# Patient Record
Sex: Male | Born: 1940 | Race: White | Hispanic: No | Marital: Married | Smoking: Former smoker
Health system: Southern US, Community
[De-identification: ages and names within clinical notes are randomized; demographics above are authoritative.]

## PROBLEM LIST (undated history)

## (undated) DIAGNOSIS — I10 Essential (primary) hypertension: Secondary | ICD-10-CM

## (undated) DIAGNOSIS — E559 Vitamin D deficiency, unspecified: Secondary | ICD-10-CM

## (undated) DIAGNOSIS — K219 Gastro-esophageal reflux disease without esophagitis: Secondary | ICD-10-CM

## (undated) DIAGNOSIS — S82871A Displaced pilon fracture of right tibia, initial encounter for closed fracture: Secondary | ICD-10-CM

## (undated) DIAGNOSIS — Z8781 Personal history of (healed) traumatic fracture: Secondary | ICD-10-CM

## (undated) DIAGNOSIS — R7303 Prediabetes: Secondary | ICD-10-CM

## (undated) DIAGNOSIS — S82201A Unspecified fracture of shaft of right tibia, initial encounter for closed fracture: Secondary | ICD-10-CM

## (undated) DIAGNOSIS — M1990 Unspecified osteoarthritis, unspecified site: Secondary | ICD-10-CM

## (undated) DIAGNOSIS — S82831A Other fracture of upper and lower end of right fibula, initial encounter for closed fracture: Secondary | ICD-10-CM

## (undated) DIAGNOSIS — H409 Unspecified glaucoma: Secondary | ICD-10-CM

## (undated) DIAGNOSIS — H269 Unspecified cataract: Secondary | ICD-10-CM

## (undated) DIAGNOSIS — H40052 Ocular hypertension, left eye: Secondary | ICD-10-CM

## (undated) DIAGNOSIS — Z8619 Personal history of other infectious and parasitic diseases: Secondary | ICD-10-CM

## (undated) HISTORY — PX: HERNIA REPAIR: SHX51

## (undated) HISTORY — DX: Unspecified cataract: H26.9

## (undated) HISTORY — DX: Unspecified glaucoma: H40.9

## (undated) HISTORY — PX: EYE SURGERY: SHX253

## (undated) HISTORY — DX: Personal history of other infectious and parasitic diseases: Z86.19

## (undated) HISTORY — PX: FRACTURE SURGERY: SHX138

## (undated) HISTORY — DX: Personal history of (healed) traumatic fracture: Z87.81

---

## 1970-05-04 HISTORY — PX: VASECTOMY: SHX75

## 1980-05-04 DIAGNOSIS — Z8781 Personal history of (healed) traumatic fracture: Secondary | ICD-10-CM

## 1980-05-04 HISTORY — PX: ORIF ANKLE FRACTURE: SHX5408

## 1980-05-04 HISTORY — DX: Personal history of (healed) traumatic fracture: Z87.81

## 2007-09-13 DIAGNOSIS — I10 Essential (primary) hypertension: Secondary | ICD-10-CM | POA: Insufficient documentation

## 2008-01-04 ENCOUNTER — Ambulatory Visit: Payer: Self-pay | Admitting: Family Medicine

## 2008-01-12 ENCOUNTER — Ambulatory Visit: Payer: Self-pay | Admitting: Family Medicine

## 2010-09-25 LAB — HM COLONOSCOPY

## 2011-06-24 DIAGNOSIS — K219 Gastro-esophageal reflux disease without esophagitis: Secondary | ICD-10-CM | POA: Diagnosis not present

## 2011-06-24 DIAGNOSIS — R079 Chest pain, unspecified: Secondary | ICD-10-CM | POA: Diagnosis not present

## 2011-06-25 ENCOUNTER — Ambulatory Visit: Payer: Self-pay | Admitting: Family Medicine

## 2011-06-25 DIAGNOSIS — M109 Gout, unspecified: Secondary | ICD-10-CM | POA: Diagnosis not present

## 2011-06-25 DIAGNOSIS — R918 Other nonspecific abnormal finding of lung field: Secondary | ICD-10-CM | POA: Diagnosis not present

## 2011-06-25 DIAGNOSIS — M79609 Pain in unspecified limb: Secondary | ICD-10-CM | POA: Diagnosis not present

## 2011-06-25 DIAGNOSIS — R079 Chest pain, unspecified: Secondary | ICD-10-CM | POA: Diagnosis not present

## 2011-06-25 DIAGNOSIS — I10 Essential (primary) hypertension: Secondary | ICD-10-CM | POA: Diagnosis not present

## 2011-06-25 DIAGNOSIS — R0609 Other forms of dyspnea: Secondary | ICD-10-CM | POA: Diagnosis not present

## 2011-07-02 DIAGNOSIS — I10 Essential (primary) hypertension: Secondary | ICD-10-CM | POA: Diagnosis not present

## 2011-07-02 DIAGNOSIS — R079 Chest pain, unspecified: Secondary | ICD-10-CM | POA: Diagnosis not present

## 2011-07-02 DIAGNOSIS — R918 Other nonspecific abnormal finding of lung field: Secondary | ICD-10-CM | POA: Diagnosis not present

## 2011-07-06 ENCOUNTER — Ambulatory Visit: Payer: Self-pay | Admitting: Family Medicine

## 2011-07-06 DIAGNOSIS — R911 Solitary pulmonary nodule: Secondary | ICD-10-CM | POA: Diagnosis not present

## 2011-07-06 DIAGNOSIS — R918 Other nonspecific abnormal finding of lung field: Secondary | ICD-10-CM | POA: Diagnosis not present

## 2011-07-09 DIAGNOSIS — H40009 Preglaucoma, unspecified, unspecified eye: Secondary | ICD-10-CM | POA: Diagnosis not present

## 2011-07-21 DIAGNOSIS — R12 Heartburn: Secondary | ICD-10-CM | POA: Diagnosis not present

## 2011-07-21 DIAGNOSIS — R9431 Abnormal electrocardiogram [ECG] [EKG]: Secondary | ICD-10-CM | POA: Diagnosis not present

## 2011-07-21 DIAGNOSIS — R079 Chest pain, unspecified: Secondary | ICD-10-CM | POA: Diagnosis not present

## 2011-07-21 DIAGNOSIS — I10 Essential (primary) hypertension: Secondary | ICD-10-CM | POA: Diagnosis not present

## 2011-07-29 DIAGNOSIS — I209 Angina pectoris, unspecified: Secondary | ICD-10-CM | POA: Diagnosis not present

## 2011-07-29 HISTORY — PX: OTHER SURGICAL HISTORY: SHX169

## 2011-08-05 DIAGNOSIS — R079 Chest pain, unspecified: Secondary | ICD-10-CM | POA: Diagnosis not present

## 2011-08-05 DIAGNOSIS — I10 Essential (primary) hypertension: Secondary | ICD-10-CM | POA: Diagnosis not present

## 2011-11-02 DIAGNOSIS — H4010X0 Unspecified open-angle glaucoma, stage unspecified: Secondary | ICD-10-CM | POA: Diagnosis not present

## 2011-11-18 DIAGNOSIS — M2390 Unspecified internal derangement of unspecified knee: Secondary | ICD-10-CM | POA: Diagnosis not present

## 2011-11-25 DIAGNOSIS — M25569 Pain in unspecified knee: Secondary | ICD-10-CM | POA: Diagnosis not present

## 2011-11-26 DIAGNOSIS — M2390 Unspecified internal derangement of unspecified knee: Secondary | ICD-10-CM | POA: Diagnosis not present

## 2011-12-03 HISTORY — PX: KNEE SURGERY: SHX244

## 2011-12-07 DIAGNOSIS — IMO0002 Reserved for concepts with insufficient information to code with codable children: Secondary | ICD-10-CM | POA: Diagnosis not present

## 2011-12-15 DIAGNOSIS — IMO0002 Reserved for concepts with insufficient information to code with codable children: Secondary | ICD-10-CM | POA: Diagnosis not present

## 2011-12-15 DIAGNOSIS — S83289A Other tear of lateral meniscus, current injury, unspecified knee, initial encounter: Secondary | ICD-10-CM | POA: Diagnosis not present

## 2011-12-15 DIAGNOSIS — M2240 Chondromalacia patellae, unspecified knee: Secondary | ICD-10-CM | POA: Diagnosis not present

## 2011-12-15 DIAGNOSIS — M659 Synovitis and tenosynovitis, unspecified: Secondary | ICD-10-CM | POA: Diagnosis not present

## 2011-12-15 DIAGNOSIS — M23302 Other meniscus derangements, unspecified lateral meniscus, unspecified knee: Secondary | ICD-10-CM | POA: Diagnosis not present

## 2011-12-15 DIAGNOSIS — M23329 Other meniscus derangements, posterior horn of medial meniscus, unspecified knee: Secondary | ICD-10-CM | POA: Diagnosis not present

## 2011-12-17 DIAGNOSIS — IMO0002 Reserved for concepts with insufficient information to code with codable children: Secondary | ICD-10-CM | POA: Diagnosis not present

## 2011-12-23 DIAGNOSIS — IMO0002 Reserved for concepts with insufficient information to code with codable children: Secondary | ICD-10-CM | POA: Diagnosis not present

## 2011-12-28 DIAGNOSIS — M2390 Unspecified internal derangement of unspecified knee: Secondary | ICD-10-CM | POA: Diagnosis not present

## 2012-05-09 DIAGNOSIS — H40009 Preglaucoma, unspecified, unspecified eye: Secondary | ICD-10-CM | POA: Diagnosis not present

## 2012-06-09 DIAGNOSIS — H4010X0 Unspecified open-angle glaucoma, stage unspecified: Secondary | ICD-10-CM | POA: Diagnosis not present

## 2012-06-24 DIAGNOSIS — Z1331 Encounter for screening for depression: Secondary | ICD-10-CM | POA: Diagnosis not present

## 2012-06-24 DIAGNOSIS — R918 Other nonspecific abnormal finding of lung field: Secondary | ICD-10-CM | POA: Diagnosis not present

## 2012-06-24 DIAGNOSIS — Z1339 Encounter for screening examination for other mental health and behavioral disorders: Secondary | ICD-10-CM | POA: Diagnosis not present

## 2012-06-24 DIAGNOSIS — I10 Essential (primary) hypertension: Secondary | ICD-10-CM | POA: Diagnosis not present

## 2012-06-24 DIAGNOSIS — Z125 Encounter for screening for malignant neoplasm of prostate: Secondary | ICD-10-CM | POA: Diagnosis not present

## 2012-06-24 DIAGNOSIS — Z0000 Encounter for general adult medical examination without abnormal findings: Secondary | ICD-10-CM | POA: Diagnosis not present

## 2012-12-22 DIAGNOSIS — H4010X0 Unspecified open-angle glaucoma, stage unspecified: Secondary | ICD-10-CM | POA: Diagnosis not present

## 2013-02-06 DIAGNOSIS — Z1339 Encounter for screening examination for other mental health and behavioral disorders: Secondary | ICD-10-CM | POA: Diagnosis not present

## 2013-02-06 DIAGNOSIS — Z1331 Encounter for screening for depression: Secondary | ICD-10-CM | POA: Diagnosis not present

## 2013-02-06 DIAGNOSIS — R109 Unspecified abdominal pain: Secondary | ICD-10-CM | POA: Diagnosis not present

## 2013-02-06 DIAGNOSIS — H6500 Acute serous otitis media, unspecified ear: Secondary | ICD-10-CM | POA: Diagnosis not present

## 2013-02-06 DIAGNOSIS — Z125 Encounter for screening for malignant neoplasm of prostate: Secondary | ICD-10-CM | POA: Diagnosis not present

## 2013-05-25 DIAGNOSIS — H2510 Age-related nuclear cataract, unspecified eye: Secondary | ICD-10-CM | POA: Diagnosis not present

## 2013-05-25 DIAGNOSIS — H40009 Preglaucoma, unspecified, unspecified eye: Secondary | ICD-10-CM | POA: Diagnosis not present

## 2013-11-17 DIAGNOSIS — H4010X0 Unspecified open-angle glaucoma, stage unspecified: Secondary | ICD-10-CM | POA: Diagnosis not present

## 2014-05-08 DIAGNOSIS — R252 Cramp and spasm: Secondary | ICD-10-CM | POA: Diagnosis not present

## 2014-05-08 DIAGNOSIS — Z0000 Encounter for general adult medical examination without abnormal findings: Secondary | ICD-10-CM | POA: Diagnosis not present

## 2014-05-08 DIAGNOSIS — I10 Essential (primary) hypertension: Secondary | ICD-10-CM | POA: Diagnosis not present

## 2014-05-08 DIAGNOSIS — E781 Pure hyperglyceridemia: Secondary | ICD-10-CM | POA: Diagnosis not present

## 2014-05-08 DIAGNOSIS — R42 Dizziness and giddiness: Secondary | ICD-10-CM | POA: Diagnosis not present

## 2014-05-08 DIAGNOSIS — Z125 Encounter for screening for malignant neoplasm of prostate: Secondary | ICD-10-CM | POA: Diagnosis not present

## 2014-05-08 DIAGNOSIS — Z23 Encounter for immunization: Secondary | ICD-10-CM | POA: Diagnosis not present

## 2014-05-08 LAB — LIPID PANEL
Cholesterol: 190 mg/dL (ref 0–200)
HDL: 34 mg/dL — AB (ref 35–70)
LDL Cholesterol: 130 mg/dL
TRIGLYCERIDES: 129 mg/dL (ref 40–160)

## 2014-05-08 LAB — BASIC METABOLIC PANEL
BUN: 22 mg/dL — AB (ref 4–21)
CREATININE: 1.2 mg/dL (ref 0.6–1.3)
Glucose: 113 mg/dL
POTASSIUM: 4.7 mmol/L (ref 3.4–5.3)
Sodium: 142 mmol/L (ref 137–147)

## 2014-05-08 LAB — CBC AND DIFFERENTIAL
HCT: 46 % (ref 41–53)
Hemoglobin: 15.7 g/dL (ref 13.5–17.5)
Platelets: 159 10*3/uL (ref 150–399)
WBC: 5.3 10*3/mL

## 2014-05-08 LAB — HEPATIC FUNCTION PANEL
ALT: 19 U/L (ref 10–40)
AST: 19 U/L (ref 14–40)

## 2014-05-08 LAB — PSA: PSA: 1.7

## 2014-05-17 DIAGNOSIS — H4011X1 Primary open-angle glaucoma, mild stage: Secondary | ICD-10-CM | POA: Diagnosis not present

## 2014-11-08 ENCOUNTER — Other Ambulatory Visit: Payer: Self-pay | Admitting: Family Medicine

## 2014-11-13 DIAGNOSIS — H4011X1 Primary open-angle glaucoma, mild stage: Secondary | ICD-10-CM | POA: Diagnosis not present

## 2014-11-15 DIAGNOSIS — H4011X1 Primary open-angle glaucoma, mild stage: Secondary | ICD-10-CM | POA: Diagnosis not present

## 2015-02-15 DIAGNOSIS — R0602 Shortness of breath: Secondary | ICD-10-CM | POA: Diagnosis not present

## 2015-02-15 DIAGNOSIS — I10 Essential (primary) hypertension: Secondary | ICD-10-CM | POA: Diagnosis not present

## 2015-02-15 DIAGNOSIS — E782 Mixed hyperlipidemia: Secondary | ICD-10-CM | POA: Diagnosis not present

## 2015-03-11 DIAGNOSIS — E782 Mixed hyperlipidemia: Secondary | ICD-10-CM | POA: Diagnosis not present

## 2015-03-11 DIAGNOSIS — I10 Essential (primary) hypertension: Secondary | ICD-10-CM | POA: Diagnosis not present

## 2015-03-11 DIAGNOSIS — R0602 Shortness of breath: Secondary | ICD-10-CM | POA: Diagnosis not present

## 2015-05-05 ENCOUNTER — Other Ambulatory Visit: Payer: Self-pay | Admitting: Family Medicine

## 2015-05-14 DIAGNOSIS — Z86010 Personal history of colonic polyps: Secondary | ICD-10-CM | POA: Insufficient documentation

## 2015-05-14 DIAGNOSIS — K649 Unspecified hemorrhoids: Secondary | ICD-10-CM | POA: Insufficient documentation

## 2015-05-14 DIAGNOSIS — H40059 Ocular hypertension, unspecified eye: Secondary | ICD-10-CM | POA: Insufficient documentation

## 2015-05-14 DIAGNOSIS — R252 Cramp and spasm: Secondary | ICD-10-CM | POA: Insufficient documentation

## 2015-05-15 ENCOUNTER — Ambulatory Visit (INDEPENDENT_AMBULATORY_CARE_PROVIDER_SITE_OTHER): Payer: Medicare Other | Admitting: Family Medicine

## 2015-05-15 ENCOUNTER — Encounter: Payer: Self-pay | Admitting: Family Medicine

## 2015-05-15 VITALS — BP 120/70 | HR 73 | Temp 98.7°F | Resp 16 | Ht 75.19 in | Wt 255.0 lb

## 2015-05-15 DIAGNOSIS — Z0000 Encounter for general adult medical examination without abnormal findings: Secondary | ICD-10-CM

## 2015-05-15 DIAGNOSIS — E781 Pure hyperglyceridemia: Secondary | ICD-10-CM

## 2015-05-15 DIAGNOSIS — I10 Essential (primary) hypertension: Secondary | ICD-10-CM | POA: Diagnosis not present

## 2015-05-15 DIAGNOSIS — Z125 Encounter for screening for malignant neoplasm of prostate: Secondary | ICD-10-CM | POA: Diagnosis not present

## 2015-05-15 NOTE — Progress Notes (Signed)
Patient: Patrick Good, Male    DOB: 1940-11-26, 75 y.o.   MRN: [id number] Visit Date: 05/15/2015  Today's Provider: Lelon Huh, MD   Chief Complaint  Patient presents with  . Medicare Wellness  . Hypertension   Subjective:    Annual wellness visit Patrick Good is a 75 y.o. male. He feels fairly well. He reports exercising some. He reports he is sleeping well.  -----------------------------------------------------------  Follow-up for hyperglyceridemia from 05/08/2014; no changes.     Hypertension, follow-up:  BP Readings from Last 3 Encounters:  05/15/15 120/70  05/08/14 120/70    He was last seen for hypertension 1 years ago.  BP at that visit was 120/70. Management since that visit includes; no changes.He reports good compliance with treatment. He is not having side effects. none  He some exercising. He is adherent to low salt diet.   Outside blood pressures are n/a. He is experiencing none.  Patient denies none.   Cardiovascular risk factors include none.  Use of agents associated with hypertension: none.   ----------------------------------------------------------------------     Review of Systems  Constitutional: Negative.   HENT: Positive for rhinorrhea.   Eyes: Negative.   Respiratory: Negative.   Cardiovascular: Negative.   Gastrointestinal: Negative.   Endocrine: Negative.   Genitourinary: Negative.   Musculoskeletal: Positive for arthralgias.  Skin: Negative.   Allergic/Immunologic: Negative.   Neurological: Positive for numbness.       Left hand is numb in the morning when he wakes, occasionally  Hematological: Negative.   Psychiatric/Behavioral: Negative.     Social History   Social History  . Marital Status: Married    Spouse Name: N/A  . Number of Children: 2  . Years of Education: N/A   Occupational History  . 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 Smoking status: Former Smoker -- 0.50 packs/day for 25 years    Types: Cigarettes    Quit date: 05/04/1992  . Smokeless tobacco: Not on file  . Alcohol Use: No  . Drug Use: No  . Sexual Activity: Not on file   Other Topics Concern  . Not on file   Social History Narrative    Past Medical History  Diagnosis Date  . History of chicken pox   . History of measles   . History of ankle fracture 1982    spiral break; left ankle     Patient Active Problem List   Diagnosis Date Noted  . History of colonic polyps 05/14/2015  . Cramps of lower extremity 05/14/2015  . Elevated IOP 05/14/2015  . Hemorrhoid 05/14/2015  . 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 Essential (primary) hypertension 09/13/2007    Past Surgical History  Procedure Laterality Date  . Knee surgery  12/2011    Left meniscus by Dr. Wylene Simmer in [place]  . Vasectomy  1972  . Hernia repair  1946 and 1952  . Myocardial perfusion scan  07/29/2011    Normal    His family history includes Breast cancer in his sister; Heart Problems in his sister; Multiple sclerosis in his sister.    Previous Medications   ASPIRIN 81 MG TABLET    Take 1 tablet by mouth daily.   LATANOPROST (XALATAN) 0.005 % OPHTHALMIC SOLUTION    Place 1 drop into both eyes daily.   LISINOPRIL-HYDROCHLOROTHIAZIDE (PRINZIDE,ZESTORETIC) 10-12.5 MG TABLET    TAKE 1 TABLET DAILY  MULTIPLE VITAMINS-MINERALS (MULTIVITAMIN ADULT PO)    Take 1 tablet by mouth daily.   NYSTATIN CREAM (MYCOSTATIN)    Apply 1 application topically 2 (two) times daily.    Patient Care Team: [name], MD as PCP - General (Family Medicine)     Objective:   Vitals: BP 120/70 mmHg  Pulse 73  Temp(Src) 98.7 F (37.1 C) (Oral)  Resp 16  Ht 6' 3.19" (1.91 m)  Wt 255 lb (115.667 kg)  BMI 31.71 kg/m2  SpO2 96%  Physical Exam   General Appearance:    Alert, cooperative, no distress, appears stated age, overweight  Head:    Normocephalic, without  obvious abnormality, atraumatic  Eyes:    PERRL, conjunctiva/corneas clear, EOM's intact, fundi    benign, both eyes       Ears:    Normal TM's and external ear canals, both ears  Nose:   Nares normal, septum midline, mucosa normal, no drainage   or sinus tenderness  Throat:   Lips, mucosa, and tongue normal; teeth and gums normal  Neck:   Supple, symmetrical, trachea midline, no adenopathy;       thyroid:  No enlargement/tenderness/nodules; no carotid   bruit or JVD  Back:     Symmetric, no curvature, ROM normal, no CVA tenderness  Lungs:     Clear to auscultation bilaterally, respirations unlabored  Chest wall:    No tenderness or deformity  Heart:    Regular rate and rhythm, S1 and S2 normal, no murmur, rub   or gallop  Abdomen:     Soft, non-tender, bowel sounds active all four quadrants,    no masses, no organomegaly  Genitalia:    deferred  Rectal:    normal tone, normal prostate, no masses or tenderness  Extremities:   Extremities normal, atraumatic, no cyanosis or edema  Pulses:   2+ and symmetric all extremities  Skin:   Skin color, texture, turgor normal, no rashes or lesions  Lymph nodes:   Cervical, supraclavicular, and axillary nodes normal  Neurologic:   CNII-XII intact. Normal strength, sensation and reflexes      throughout    Activities of Daily Living In your present state of health, do you have any difficulty performing the following activities: 05/15/2015  Hearing? N  Vision? N  Difficulty concentrating or making decisions? N  Walking or climbing stairs? N  Dressing or bathing? N  Doing errands, shopping? N    Fall Risk Assessment Fall Risk  05/15/2015  Falls in the past year? No     Depression Screen PHQ 2/9 Scores 05/15/2015  PHQ - 2 Score 0    Cognitive Testing - 6-CIT  Correct? Score   What year is it? yes 0 0 or 4  What month is it? yes 0 0 or 3  Memorize:    Patrick Good,  44,  [address]  [place],      What time is it? (within 1 hour) yes 0  0 or 3  Count backwards from 20 yes 0 0, 2, or 4  Name the months of the year yes 0 0, 2, or 4  Repeat name & address above yes 0 0, 2, 4, 6, 8, or 10       TOTAL SCORE  0/28   Interpretation:  Normal  Normal (0-7) Abnormal (8-28)    Audit-C Alcohol Use Screening  Question Answer Points  How often do you have alcoholic drink? never 0  On days you  do drink alcohol, how many drinks do you typically consume? never 0  How oftey will you drink 6 or more in a total? never 0  Total Score:  0   A score of 3 or more in women, and 4 or more in men indicates increased risk for alcohol abuse, EXCEPT if all of the points are from question 1.     Assessment & Plan:     Annual Wellness Visit  Reviewed patient's Family Medical History Reviewed and updated list of patient's medical providers Assessment of cognitive impairment was done Assessed patient's functional ability Established a written schedule for health screening [place] Completed and Reviewed  Exercise Activities and Dietary recommendations Goals    None      Immunization History  Administered Date(s) Administered  . Influenza, High Dose Seasonal PF 01/23/2015  . Pneumococcal Conjugate-13 05/08/2014  . Pneumococcal Polysaccharide-23 03/22/2009  . Tdap 09/13/2007  . Zoster 06/02/2005    Health Maintenance  Topic Date Due  . INFLUENZA VACCINE  12/03/2014  . TETANUS/TDAP  09/12/2017  . COLONOSCOPY  09/24/2020  . ZOSTAVAX  Completed  . PNA vac Low Risk Adult  Completed      Discussed health benefits of physical activity, and encouraged him to engage in regular exercise appropriate for his age and condition.    -------------------------------------------------------------------------------- 1. Medicare annual wellness visit, subsequent   2. Essential (primary) hypertension Well controlled.  Continue current medications.   He had cardiac evaluation by Dr. Nehemiah Massed recently with normal  stress echocardiogram.  - Renal function panel  3. Pure hyperglyceridemia  - Lipid panel  4. Prostate cancer screening  - PSA

## 2015-05-16 LAB — RENAL FUNCTION PANEL
ALBUMIN: 4.3 g/dL (ref 3.5–4.8)
BUN/Creatinine Ratio: 22 (ref 10–22)
BUN: 25 mg/dL (ref 8–27)
CALCIUM: 9.3 mg/dL (ref 8.6–10.2)
CHLORIDE: 101 mmol/L (ref 96–106)
CO2: 23 mmol/L (ref 18–29)
Creatinine, Ser: 1.15 mg/dL (ref 0.76–1.27)
GFR calc Af Amer: 73 mL/min/{1.73_m2} (ref >59)
GFR calc non Af Amer: 63 mL/min/{1.73_m2} (ref >59)
GLUCOSE: 112 mg/dL — AB (ref 65–99)
PHOSPHORUS: 2.8 mg/dL (ref 2.5–4.5)
POTASSIUM: 4.5 mmol/L (ref 3.5–5.2)
Sodium: 143 mmol/L (ref 134–144)

## 2015-05-16 LAB — LIPID PANEL
CHOLESTEROL TOTAL: 191 mg/dL (ref 100–199)
Chol/HDL Ratio: 6.2 ratio units — ABNORMAL HIGH (ref 0.0–5.0)
HDL: 31 mg/dL — ABNORMAL LOW (ref >39)
LDL CALC: 123 mg/dL — AB (ref 0–99)
Triglycerides: 184 mg/dL — ABNORMAL HIGH (ref 0–149)
VLDL Cholesterol Cal: 37 mg/dL (ref 5–40)

## 2015-05-16 LAB — PSA: Prostate Specific Ag, Serum: 1.7 ng/mL (ref 0.0–4.0)

## 2015-05-23 DIAGNOSIS — H401131 Primary open-angle glaucoma, bilateral, mild stage: Secondary | ICD-10-CM | POA: Diagnosis not present

## 2015-11-21 DIAGNOSIS — H401131 Primary open-angle glaucoma, bilateral, mild stage: Secondary | ICD-10-CM | POA: Diagnosis not present

## 2016-01-03 LAB — LIPID PANEL
Cholesterol: 204 mg/dL — AB (ref 0–200)
HDL: 34 mg/dL — AB (ref 35–70)
LDL Cholesterol: 138 mg/dL
Triglycerides: 155 mg/dL (ref 40–160)

## 2016-01-03 LAB — HEPATIC FUNCTION PANEL
ALT: 19 U/L (ref 10–40)
AST: 19 U/L (ref 14–40)
Alkaline Phosphatase: 52 U/L (ref 25–125)
BILIRUBIN, TOTAL: 0.7 mg/dL

## 2016-01-03 LAB — HEMOGLOBIN A1C: Hemoglobin A1C: 5.9

## 2016-01-03 LAB — BASIC METABOLIC PANEL
CREATININE: 1.4 mg/dL — AB (ref 0.6–1.3)
GLUCOSE: 109 mg/dL

## 2016-01-03 LAB — PSA: PSA: 1.68

## 2016-01-03 LAB — HIV ANTIBODY (ROUTINE TESTING W REFLEX)

## 2016-01-03 LAB — HEPATITIS C ANTIBODY: Anti-HCV: NONREACTIVE

## 2016-03-20 ENCOUNTER — Encounter: Payer: Self-pay | Admitting: Family Medicine

## 2016-04-29 ENCOUNTER — Other Ambulatory Visit: Payer: Self-pay | Admitting: Family Medicine

## 2016-05-19 ENCOUNTER — Encounter: Payer: Self-pay | Admitting: Family Medicine

## 2016-05-19 ENCOUNTER — Ambulatory Visit (INDEPENDENT_AMBULATORY_CARE_PROVIDER_SITE_OTHER): Payer: Medicare Other | Admitting: Family Medicine

## 2016-05-19 VITALS — BP 120/70 | HR 76 | Temp 98.4°F | Resp 16 | Ht 75.0 in | Wt 253.0 lb

## 2016-05-19 DIAGNOSIS — R7303 Prediabetes: Secondary | ICD-10-CM

## 2016-05-19 DIAGNOSIS — I10 Essential (primary) hypertension: Secondary | ICD-10-CM

## 2016-05-19 DIAGNOSIS — Z0000 Encounter for general adult medical examination without abnormal findings: Secondary | ICD-10-CM | POA: Diagnosis not present

## 2016-05-19 DIAGNOSIS — Z125 Encounter for screening for malignant neoplasm of prostate: Secondary | ICD-10-CM

## 2016-05-19 LAB — POCT GLYCOSYLATED HEMOGLOBIN (HGB A1C)
Est. average glucose Bld gHb Est-mCnc: 137
HEMOGLOBIN A1C: 6.4

## 2016-05-19 NOTE — Patient Instructions (Addendum)
   Your average blood sugar today is 137, diabetes is 140   Avoid sweets and starchy foods. Whole fruit is OK, but you should not consume canned fruit or fruit juices.    It is recommended to engage in 150 minutes of moderate exercise, such as brisk walking every week.    Work on losing 20 pounds over the next four months to reduce the risk of becoming diabetic.

## 2016-05-19 NOTE — Progress Notes (Signed)
Patient: Patrick Good, Male    DOB: January 20, 1941, 76 y.o.   MRN: [id number] Visit Date: 05/19/2016  Today's Provider: Lelon Huh, MD   Chief Complaint  Patient presents with  . Medicare Wellness  . Hypertension  . Hyperlipidemia   Subjective:    Annual wellness visit Patrick Good is a 76 y.o. male. He feels well. He reports exercising some/walking. He reports he is sleeping fairly well.  -----------------------------------------------------------    Hypertension, follow-up:  BP Readings from Last 3 Encounters:  05/19/16 120/70  05/15/15 120/70  05/08/14 120/70    He was last seen for hypertension 1 years ago.  BP at that visit was 120/70. Management since that visit includes; no changes.He reports good compliance with treatment. He is not having side effects. none He is exercising. He is adherent to low salt diet.   Outside blood pressures are n/a. He is experiencing none.  Patient denies none.   Cardiovascular risk factors include none.  Use of agents associated with hypertension: none.   ----------------------------------------------------------------    Lipid/Cholesterol, Follow-up:   Last seen for this 1 years ago.  Management since that visit includes; no changes.  Last Lipid Panel:    Component Value Date/Time   CHOL 191 05/15/2015 1007   TRIG 184 (H) 05/15/2015 1007   HDL 31 (L) 05/15/2015 1007   CHOLHDL 6.2 (H) 05/15/2015 1007   LDLCALC 123 (H) 05/15/2015 1007    He reports good compliance with treatment. He is not having side effects. none  Wt Readings from Last 3 Encounters:  05/19/16 253 lb (114.8 kg)  05/15/15 255 lb (115.7 kg)  05/08/14 252 lb (114.3 kg)    ----------------------------------------------------------------  He has insurance physical in august with normal PSA, sable cholesterol at 204 and LDL of 138. However A1c was noted to be 5.9%. He does have an older sister with diabetes. He drinks diluted OJ  every day and and diet is heavy on meats, potatoes and bread. Has not been exercising much lately.    Review of Systems  Constitutional: Negative for chills, diaphoresis and fever.  HENT: Negative for congestion, ear discharge, ear pain, hearing loss, nosebleeds, sore throat and tinnitus.   Eyes: Negative for photophobia, pain, discharge and redness.  Respiratory: Negative for cough, shortness of breath, wheezing and stridor.   Cardiovascular: Negative for chest pain, palpitations and leg swelling.  Gastrointestinal: Negative for abdominal pain, blood in stool, constipation, diarrhea, nausea and vomiting.  Endocrine: Negative for polydipsia.  Genitourinary: Negative for dysuria, flank pain, frequency, hematuria and urgency.  Musculoskeletal: Negative for back pain, myalgias and neck pain.  Skin: Positive for rash.  Allergic/Immunologic: Negative for environmental allergies.  Neurological: Negative for dizziness, tremors, seizures, weakness and headaches.  Hematological: Does not bruise/bleed easily.  Psychiatric/Behavioral: Negative for hallucinations and suicidal ideas. The patient is not nervous/anxious.   All other systems reviewed and are negative.   Social History   Social History  . Marital status: Married    Spouse name: N/A  . Number of children: 2  . Years of education: N/A   Occupational History  . Retired     Former Doctor, hospital working part time in [employer]   Social History Main Topics  . Smoking status: Former Smoker    Packs/day: 0.50    Years: 25.00    Types: Cigarettes    Quit date: 05/04/1992  . Smokeless tobacco: Not on file  . Alcohol use No  . Drug  use: No  . Sexual activity: Not on file   Other Topics Concern  . Not on file   Social History Narrative  . No narrative on file    Past Medical History:  Diagnosis Date  . History of ankle fracture 1982   spiral break; left ankle  . History of chicken pox   . History of measles       Patient Active Problem List   Diagnosis Date Noted  . History of colonic polyps 05/14/2015  . Cramps of lower extremity 05/14/2015  . Elevated IOP 05/14/2015  . Hemorrhoid 05/14/2015  . 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 Essential (primary) hypertension 09/13/2007    Past Surgical History:  Procedure Laterality Date  . HERNIA REPAIR  1946 and 1952  . KNEE SURGERY  12/2011   Left meniscus by Dr. Wylene Simmer in [place]  . Myocardial Perfusion Scan  07/29/2011   Normal  . VASECTOMY  1972    His family history includes Breast cancer in his sister; Heart Problems in his sister; Multiple sclerosis in his sister.      Current Outpatient Prescriptions:  .  aspirin 81 MG tablet, Take 1 tablet by mouth daily., Disp: , Rfl:  .  latanoprost (XALATAN) 0.005 % ophthalmic solution, Place 1 drop into both eyes daily., Disp: , Rfl:  .  lisinopril-hydrochlorothiazide (PRINZIDE,ZESTORETIC) 10-12.5 MG tablet, TAKE 1 TABLET DAILY, Disp: 90 tablet, Rfl: 4 .  Multiple Vitamins-Minerals (MULTIVITAMIN ADULT PO), Take 1 tablet by mouth daily., Disp: , Rfl:   Patient Care Team: [name], MD as PCP - General (Family Medicine)     Objective:   Vitals: BP 120/70 (BP Location: Right Arm, Patient Position: Sitting, Cuff Size: Large)   Pulse 76   Temp 98.4 F (36.9 C) (Oral)   Resp 16   Ht 6\' 3"  (1.905 m)   Wt 253 lb (114.8 kg)   SpO2 96%   BMI 31.62 kg/m   Physical Exam   General Appearance:    Alert, cooperative, no distress, appears stated age, overweight.   Head:    Normocephalic, without obvious abnormality, atraumatic  Eyes:    PERRL, conjunctiva/corneas clear, EOM's intact, fundi    benign, both eyes       Ears:    Normal TM's and external ear canals, both ears  Nose:   Nares normal, septum midline, mucosa normal, no drainage   or sinus tenderness  Throat:   Lips, mucosa, and tongue normal; teeth and gums normal  Neck:   Supple, symmetrical, trachea midline, no  adenopathy;       thyroid:  No enlargement/tenderness/nodules; no carotid   bruit or JVD  Back:     Symmetric, no curvature, ROM normal, no CVA tenderness  Lungs:     Clear to auscultation bilaterally, respirations unlabored  Chest wall:    No tenderness or deformity  Heart:    Regular rate and rhythm, S1 and S2 normal, no murmur, rub   or gallop  Abdomen:     Soft, non-tender, bowel sounds active all four quadrants,    no masses, no organomegaly  Genitalia:    deferred  Rectal:    deferred  Extremities:   Extremities normal, atraumatic, no cyanosis or edema  Pulses:   2+ and symmetric all extremities  Skin:   Skin color, texture, turgor normal, no rashes or lesions  Lymph nodes:   Cervical, supraclavicular, and axillary nodes normal  Neurologic:   CNII-XII intact. Normal strength, sensation and  reflexes      throughout    Activities of Daily Living In your present state of health, do you have any difficulty performing the following activities: 05/19/2016  Hearing? N  Vision? N  Difficulty concentrating or making decisions? N  Walking or climbing stairs? N  Dressing or bathing? N  Doing errands, shopping? N  Some recent data might be hidden    Fall Risk Assessment Fall Risk  05/19/2016 05/15/2015  Falls in the past year? No No     Depression Screen PHQ 2/9 Scores 05/19/2016 05/15/2015  PHQ - 2 Score 0 0    Cognitive Testing - 6-CIT  Correct? Score   What year is it? yes 0 0 or 4  What month is it? yes 0 0 or 3  Memorize:    [name],  44,  High [address]  [place],      What time is it? (within 1 hour) yes 0 0 or 3  Count backwards from 20 yes 0 0, 2, or 4  Name the months of the year yes 0 0, 2, or 4  Repeat name & address above yes 0 0, 2, 4, 6, 8, or 10       TOTAL SCORE  0/28   Interpretation:  Normal  Normal (0-7) Abnormal (8-28)    Audit-C Alcohol Use Screening  Question Answer Points  How often do you have alcoholic drink? never 0  On days you do drink  alcohol, how many drinks do you typically consume? n/a 0  How oftey will you drink 6 or more in a total? never 0  Total Score:  0   A score of 3 or more in women, and 4 or more in men indicates increased risk for alcohol abuse, EXCEPT if all of the points are from question 1.  Results for orders placed or performed in visit on 05/19/16  POCT glycosylated hemoglobin (Hb A1C)  Result Value Ref Range   Hemoglobin A1C 6.4    Est. average glucose Bld gHb Est-mCnc 137       Assessment & Plan:     Annual Wellness Visit  Reviewed patient's Family Medical History Reviewed and updated list of patient's medical providers Assessment of cognitive impairment was done Assessed patient's functional ability Established a written schedule for health screening [place] Completed and Reviewed  Exercise Activities and Dietary recommendations Goals    None      Immunization History  Administered Date(s) Administered  . Influenza, High Dose Seasonal PF 01/23/2015  . Pneumococcal Conjugate-13 05/08/2014  . Pneumococcal Polysaccharide-23 03/22/2009  . Tdap 09/13/2007  . Zoster 06/02/2005    Health Maintenance  Topic Date Due  . INFLUENZA VACCINE  12/03/2015  . TETANUS/TDAP  09/12/2017  . COLONOSCOPY  09/24/2020  . ZOSTAVAX  Completed  . PNA vac Low Risk Adult  Completed     Discussed health benefits of physical activity, and encouraged him to engage in regular exercise appropriate for his age and condition.    -------------------------------------------------------------------------- 1. Medicare annual wellness visit, subsequent   2. Essential (primary) hypertension Well controlled.  Continue current medications.   - EKG 12-Lead  3. Prostate cancer screening   4. Pre-diabetes New diagnosis. Counseled on increased risk for diabetes and strategies to mitigate risk including lifestyle changes as below.   Patient Instructions   Your average blood sugar  today is 137, diabetes is 140   Avoid sweets and starchy foods. Whole fruit is OK, but you  should not consume canned fruit or fruit juices.    It is recommended to engage in 150 minutes of moderate exercise, such as brisk walking every week.    Work on losing 20 pounds over the next four months to reduce the risk of becoming diabetic.     Return in about 4 months (around 09/16/2016).  - POCT glycosylated hemoglobin (Hb A1C)  The entirety of the information documented in the History of Present Illness, Review of Systems and Physical Exam were personally obtained by me. Portions of this information were initially documented by April M. Sabra Heck, CMA and reviewed by me for thoroughness and accuracy.    Lelon Huh, MD  [facility]

## 2016-05-30 ENCOUNTER — Encounter: Payer: Self-pay | Admitting: Family Medicine

## 2016-06-08 DIAGNOSIS — H401131 Primary open-angle glaucoma, bilateral, mild stage: Secondary | ICD-10-CM | POA: Diagnosis not present

## 2016-07-07 ENCOUNTER — Encounter: Payer: Self-pay | Admitting: Family Medicine

## 2016-10-02 ENCOUNTER — Encounter: Payer: Self-pay | Admitting: Family Medicine

## 2016-10-02 ENCOUNTER — Ambulatory Visit (INDEPENDENT_AMBULATORY_CARE_PROVIDER_SITE_OTHER): Payer: Medicare Other | Admitting: Family Medicine

## 2016-10-02 VITALS — BP 120/70 | HR 68 | Temp 98.2°F | Resp 16 | Ht 75.0 in | Wt 253.0 lb

## 2016-10-02 DIAGNOSIS — R7303 Prediabetes: Secondary | ICD-10-CM | POA: Diagnosis not present

## 2016-10-02 DIAGNOSIS — B356 Tinea cruris: Secondary | ICD-10-CM

## 2016-10-02 DIAGNOSIS — I10 Essential (primary) hypertension: Secondary | ICD-10-CM

## 2016-10-02 DIAGNOSIS — L309 Dermatitis, unspecified: Secondary | ICD-10-CM

## 2016-10-02 LAB — POCT GLYCOSYLATED HEMOGLOBIN (HGB A1C)
Est. average glucose Bld gHb Est-mCnc: 120
HEMOGLOBIN A1C: 5.8

## 2016-10-02 MED ORDER — TRIAMCINOLONE ACETONIDE 0.5 % EX OINT: 1.0000 "application " | TOPICAL_OINTMENT | Freq: Two times a day (BID) | CUTANEOUS | 0 refills | Status: DC

## 2016-10-02 MED ORDER — NYSTATIN 100000 UNIT/GM EX CREA: 1.0000 "application " | TOPICAL_CREAM | Freq: Two times a day (BID) | CUTANEOUS | 0 refills | Status: DC

## 2016-10-02 NOTE — Progress Notes (Signed)
Patient: Patrick Good Male    DOB: Feb 19, 1941   76 y.o.   MRN: [id number] Visit Date: 10/02/2016  Today's Provider: Lelon Huh, MD   Chief Complaint  Patient presents with  . Follow-up  . Hypertension   Subjective:    HPI   Hypertension, follow-up:  BP Readings from Last 3 Encounters:  10/02/16 120/70  05/19/16 120/70  05/15/15 120/70    He was last seen for hypertension 5 months ago.  BP at that visit was 120/70. Management since that visit includes; no changes.He reports good compliance with treatment. He is not having side effects. none He is not exercising. He is not adherent to low salt diet.   Outside blood pressures are not chaecking. He is experiencing none.  Patient denies none.   Cardiovascular risk factors include pre-diabetes.  Use of agents associated with hypertension: none.   ----------------------------------------------------------------  Pre-diabetes From 05/19/2016. Aic at that time was 6.4. Estimated average glucose = 137. He has since been on very strict low sugar diet and strictly avoiding simple starches such as breads and pizza crust. Remains physically active. Feels well but not enjoying dietary changes he made.  . Wt Readings from Last 3 Encounters:  10/02/16 253 lb (114.8 kg)  05/19/16 253 lb (114.8 kg)  05/15/15 255 lb (115.7 kg)    He also reports that he occasionally gets rash in groin area. Was prescribed nystatin cream several years ago and he states this always clears rash up right away, but he has used it all up and requests refill. He also gets dry patches on his skin from time to time which is not itchy or painful.     No Known Allergies   Current Outpatient Prescriptions:  .  aspirin 81 MG tablet, Take 1 tablet by mouth daily., Disp: , Rfl:  .  latanoprost (XALATAN) 0.005 % ophthalmic solution, Place 1 drop into both eyes daily., Disp: , Rfl:  .  lisinopril-hydrochlorothiazide (PRINZIDE,ZESTORETIC) 10-12.5 MG  tablet, TAKE 1 TABLET DAILY, Disp: 90 tablet, Rfl: 4 .  Multiple Vitamins-Minerals (MULTIVITAMIN ADULT PO), Take 1 tablet by mouth daily., Disp: , Rfl:  .  Multiple Vitamins-Minerals (VISION VITAMINS PO), Take by mouth., Disp: , Rfl:   Review of Systems  Constitutional: Negative for appetite change, chills and fever.  Respiratory: Negative for chest tightness, shortness of breath and wheezing.   Cardiovascular: Negative for chest pain and palpitations.  Gastrointestinal: Negative for abdominal pain, nausea and vomiting.    Social History  Substance Use Topics  . Smoking status: Former Smoker    Packs/day: 0.50    Years: 25.00    Types: Cigarettes    Quit date: 05/04/1992  . Smokeless tobacco: Never Used  . Alcohol use No   Objective:   BP 120/70 (BP Location: Right Arm, Patient Position: Sitting, Cuff Size: Large)   Pulse 68   Temp 98.2 F (36.8 C) (Oral)   Resp 16   Ht 6\' 3"  (1.905 m)   Wt 253 lb (114.8 kg)   SpO2 94%   BMI 31.62 kg/m  Vitals:   10/02/16 1440  BP: 120/70  Pulse: 68  Resp: 16  Temp: 98.2 F (36.8 C)  TempSrc: Oral  SpO2: 94%  Weight: 253 lb (114.8 kg)  Height: 6\' 3"  (1.905 m)     Physical Exam  General appearance: alert, well developed, well nourished, cooperative and in no distress Head: Normocephalic, without obvious abnormality, atraumatic Respiratory: Respirations even and unlabored,  normal respiratory rate Extremities: No gross deformities Skin: Skin color, texture, turgor normal. No rashes seen  Psych: Appropriate mood and affect. Neurologic: Mental status: Alert, oriented to person, place, and time, thought content appropriate.   Results for orders placed or performed in visit on 10/02/16  POCT glycosylated hemoglobin (Hb A1C)  Result Value Ref Range   Hemoglobin A1C 5.8    Est. average glucose Bld gHb Est-mCnc 120        Assessment & Plan:     1. Pre-diabetes Doing great with diet. He is getting frustrated with following such  a strict diet, and I advised him that he could probably loosen up on a little bit so long as he a1c stays well below 6.5. Is to continue daily physical activity and exercise as tolerated. Will return to check A1c in about 6 months.  - POCT glycosylated hemoglobin (Hb A1C)  2. Tinea cruris Intermittent. Responds well to Nystatin cream which he is out of and is refilled today.   3. Eczema, unspecified type Advised to call for dermatology referral if triamcinolone is not effective.  - triamcinolone ointment (KENALOG) 0.5 %; Apply 1 application topically 2 (two) times daily. To rash on forearm  Dispense: 15 g; Refill: 0  4. Essential (primary) hypertension Well controlled.  Continue current medications.    Return in about 7 months (around 05/04/2017) for Yearly Physical.      The entirety of the information documented in the History of Present Illness, Review of Systems and Physical Exam were personally obtained by me. Portions of this information were initially documented by April M. Sabra Heck, CMA and reviewed by me for thoroughness and accuracy.    Lelon Huh, MD  [facility]

## 2016-10-06 ENCOUNTER — Inpatient Hospital Stay (HOSPITAL_COMMUNITY): Payer: Medicare Other

## 2016-10-06 ENCOUNTER — Emergency Department (HOSPITAL_COMMUNITY): Payer: Medicare Other

## 2016-10-06 ENCOUNTER — Inpatient Hospital Stay (HOSPITAL_COMMUNITY)
Admission: EM | Admit: 2016-10-06 | Discharge: 2016-10-12 | DRG: 493 | Disposition: A | Discharge status: Rehabilitation Facility | Payer: Medicare Other | Attending: Orthopedic Surgery | Admitting: Orthopedic Surgery

## 2016-10-06 ENCOUNTER — Encounter (HOSPITAL_COMMUNITY): Payer: Self-pay

## 2016-10-06 DIAGNOSIS — S82831A Other fracture of upper and lower end of right fibula, initial encounter for closed fracture: Secondary | ICD-10-CM | POA: Diagnosis not present

## 2016-10-06 DIAGNOSIS — M25061 Hemarthrosis, right knee: Secondary | ICD-10-CM

## 2016-10-06 DIAGNOSIS — E860 Dehydration: Secondary | ICD-10-CM | POA: Diagnosis present

## 2016-10-06 DIAGNOSIS — N289 Disorder of kidney and ureter, unspecified: Secondary | ICD-10-CM

## 2016-10-06 DIAGNOSIS — S82401S Unspecified fracture of shaft of right fibula, sequela: Secondary | ICD-10-CM | POA: Diagnosis not present

## 2016-10-06 DIAGNOSIS — N183 Chronic kidney disease, stage 3 (moderate): Secondary | ICD-10-CM | POA: Diagnosis not present

## 2016-10-06 DIAGNOSIS — Z833 Family history of diabetes mellitus: Secondary | ICD-10-CM | POA: Diagnosis not present

## 2016-10-06 DIAGNOSIS — E781 Pure hyperglyceridemia: Secondary | ICD-10-CM | POA: Diagnosis present

## 2016-10-06 DIAGNOSIS — Z7982 Long term (current) use of aspirin: Secondary | ICD-10-CM | POA: Diagnosis not present

## 2016-10-06 DIAGNOSIS — K5903 Drug induced constipation: Secondary | ICD-10-CM

## 2016-10-06 DIAGNOSIS — S82251D Displaced comminuted fracture of shaft of right tibia, subsequent encounter for closed fracture with routine healing: Secondary | ICD-10-CM | POA: Diagnosis not present

## 2016-10-06 DIAGNOSIS — Z87891 Personal history of nicotine dependence: Secondary | ICD-10-CM

## 2016-10-06 DIAGNOSIS — S82871D Displaced pilon fracture of right tibia, subsequent encounter for closed fracture with routine healing: Secondary | ICD-10-CM | POA: Diagnosis not present

## 2016-10-06 DIAGNOSIS — M25461 Effusion, right knee: Secondary | ICD-10-CM | POA: Diagnosis present

## 2016-10-06 DIAGNOSIS — S83241A Other tear of medial meniscus, current injury, right knee, initial encounter: Secondary | ICD-10-CM | POA: Diagnosis present

## 2016-10-06 DIAGNOSIS — I129 Hypertensive chronic kidney disease with stage 1 through stage 4 chronic kidney disease, or unspecified chronic kidney disease: Secondary | ICD-10-CM | POA: Diagnosis present

## 2016-10-06 DIAGNOSIS — S8991XA Unspecified injury of right lower leg, initial encounter: Secondary | ICD-10-CM | POA: Diagnosis not present

## 2016-10-06 DIAGNOSIS — M898X9 Other specified disorders of bone, unspecified site: Secondary | ICD-10-CM | POA: Diagnosis present

## 2016-10-06 DIAGNOSIS — S82409A Unspecified fracture of shaft of unspecified fibula, initial encounter for closed fracture: Secondary | ICD-10-CM

## 2016-10-06 DIAGNOSIS — S82831S Other fracture of upper and lower end of right fibula, sequela: Secondary | ICD-10-CM | POA: Diagnosis not present

## 2016-10-06 DIAGNOSIS — S82251A Displaced comminuted fracture of shaft of right tibia, initial encounter for closed fracture: Principal | ICD-10-CM | POA: Diagnosis present

## 2016-10-06 DIAGNOSIS — Z803 Family history of malignant neoplasm of breast: Secondary | ICD-10-CM | POA: Diagnosis not present

## 2016-10-06 DIAGNOSIS — S82251S Displaced comminuted fracture of shaft of right tibia, sequela: Secondary | ICD-10-CM | POA: Diagnosis not present

## 2016-10-06 DIAGNOSIS — S82201D Unspecified fracture of shaft of right tibia, subsequent encounter for closed fracture with routine healing: Secondary | ICD-10-CM | POA: Diagnosis not present

## 2016-10-06 DIAGNOSIS — R7303 Prediabetes: Secondary | ICD-10-CM | POA: Diagnosis not present

## 2016-10-06 DIAGNOSIS — S82301A Unspecified fracture of lower end of right tibia, initial encounter for closed fracture: Secondary | ICD-10-CM | POA: Diagnosis not present

## 2016-10-06 DIAGNOSIS — W1830XA Fall on same level, unspecified, initial encounter: Secondary | ICD-10-CM | POA: Diagnosis present

## 2016-10-06 DIAGNOSIS — S82831D Other fracture of upper and lower end of right fibula, subsequent encounter for closed fracture with routine healing: Secondary | ICD-10-CM | POA: Diagnosis not present

## 2016-10-06 DIAGNOSIS — S82201S Unspecified fracture of shaft of right tibia, sequela: Secondary | ICD-10-CM | POA: Diagnosis not present

## 2016-10-06 DIAGNOSIS — S82891A Other fracture of right lower leg, initial encounter for closed fracture: Secondary | ICD-10-CM | POA: Diagnosis not present

## 2016-10-06 DIAGNOSIS — S82201A Unspecified fracture of shaft of right tibia, initial encounter for closed fracture: Secondary | ICD-10-CM | POA: Diagnosis not present

## 2016-10-06 DIAGNOSIS — G8918 Other acute postprocedural pain: Secondary | ICD-10-CM | POA: Diagnosis not present

## 2016-10-06 DIAGNOSIS — T465X5A Adverse effect of other antihypertensive drugs, initial encounter: Secondary | ICD-10-CM | POA: Diagnosis present

## 2016-10-06 DIAGNOSIS — Z86010 Personal history of colonic polyps: Secondary | ICD-10-CM

## 2016-10-06 DIAGNOSIS — E559 Vitamin D deficiency, unspecified: Secondary | ICD-10-CM | POA: Diagnosis present

## 2016-10-06 DIAGNOSIS — S83281A Other tear of lateral meniscus, current injury, right knee, initial encounter: Secondary | ICD-10-CM | POA: Diagnosis present

## 2016-10-06 DIAGNOSIS — I951 Orthostatic hypotension: Secondary | ICD-10-CM | POA: Diagnosis not present

## 2016-10-06 DIAGNOSIS — R55 Syncope and collapse: Secondary | ICD-10-CM

## 2016-10-06 DIAGNOSIS — Z419 Encounter for procedure for purposes other than remedying health state, unspecified: Secondary | ICD-10-CM

## 2016-10-06 DIAGNOSIS — S8251XA Displaced fracture of medial malleolus of right tibia, initial encounter for closed fracture: Secondary | ICD-10-CM | POA: Diagnosis present

## 2016-10-06 DIAGNOSIS — I9589 Other hypotension: Secondary | ICD-10-CM | POA: Diagnosis not present

## 2016-10-06 DIAGNOSIS — S82871A Displaced pilon fracture of right tibia, initial encounter for closed fracture: Secondary | ICD-10-CM

## 2016-10-06 DIAGNOSIS — S82302A Unspecified fracture of lower end of left tibia, initial encounter for closed fracture: Secondary | ICD-10-CM

## 2016-10-06 DIAGNOSIS — R269 Unspecified abnormalities of gait and mobility: Secondary | ICD-10-CM | POA: Diagnosis not present

## 2016-10-06 DIAGNOSIS — S82101D Unspecified fracture of upper end of right tibia, subsequent encounter for closed fracture with routine healing: Secondary | ICD-10-CM | POA: Diagnosis not present

## 2016-10-06 DIAGNOSIS — S82871S Displaced pilon fracture of right tibia, sequela: Secondary | ICD-10-CM | POA: Diagnosis not present

## 2016-10-06 DIAGNOSIS — Z820 Family history of epilepsy and other diseases of the nervous system: Secondary | ICD-10-CM

## 2016-10-06 DIAGNOSIS — Z0389 Encounter for observation for other suspected diseases and conditions ruled out: Secondary | ICD-10-CM | POA: Diagnosis not present

## 2016-10-06 DIAGNOSIS — I10 Essential (primary) hypertension: Secondary | ICD-10-CM | POA: Diagnosis present

## 2016-10-06 DIAGNOSIS — W19XXXA Unspecified fall, initial encounter: Secondary | ICD-10-CM

## 2016-10-06 DIAGNOSIS — T148XXA Other injury of unspecified body region, initial encounter: Secondary | ICD-10-CM | POA: Diagnosis not present

## 2016-10-06 DIAGNOSIS — N179 Acute kidney failure, unspecified: Secondary | ICD-10-CM

## 2016-10-06 DIAGNOSIS — R799 Abnormal finding of blood chemistry, unspecified: Secondary | ICD-10-CM | POA: Diagnosis not present

## 2016-10-06 DIAGNOSIS — S82451A Displaced comminuted fracture of shaft of right fibula, initial encounter for closed fracture: Secondary | ICD-10-CM | POA: Diagnosis present

## 2016-10-06 DIAGNOSIS — D696 Thrombocytopenia, unspecified: Secondary | ICD-10-CM | POA: Diagnosis not present

## 2016-10-06 DIAGNOSIS — S82209A Unspecified fracture of shaft of unspecified tibia, initial encounter for closed fracture: Secondary | ICD-10-CM

## 2016-10-06 DIAGNOSIS — D62 Acute posthemorrhagic anemia: Secondary | ICD-10-CM

## 2016-10-06 DIAGNOSIS — I959 Hypotension, unspecified: Secondary | ICD-10-CM

## 2016-10-06 DIAGNOSIS — S82401A Unspecified fracture of shaft of right fibula, initial encounter for closed fracture: Secondary | ICD-10-CM | POA: Diagnosis not present

## 2016-10-06 DIAGNOSIS — S82461A Displaced segmental fracture of shaft of right fibula, initial encounter for closed fracture: Secondary | ICD-10-CM | POA: Diagnosis not present

## 2016-10-06 HISTORY — DX: Displaced pilon fracture of right tibia, initial encounter for closed fracture: S82.871A

## 2016-10-06 HISTORY — DX: Other fracture of upper and lower end of right fibula, initial encounter for closed fracture: S82.831A

## 2016-10-06 HISTORY — DX: Ocular hypertension, left eye: H40.052

## 2016-10-06 HISTORY — DX: Unspecified fracture of shaft of right tibia, initial encounter for closed fracture: S82.201A

## 2016-10-06 HISTORY — DX: Essential (primary) hypertension: I10

## 2016-10-06 HISTORY — DX: Vitamin D deficiency, unspecified: E55.9

## 2016-10-06 HISTORY — DX: Other fracture of upper and lower end of right fibula, initial encounter for closed fracture: S82.871A

## 2016-10-06 LAB — CBC WITH DIFFERENTIAL/PLATELET
BASOS PCT: 0 %
Basophils Absolute: 0 10*3/uL (ref 0.0–0.1)
EOS ABS: 0 10*3/uL (ref 0.0–0.7)
EOS PCT: 0 %
HCT: 39.9 % (ref 39.0–52.0)
Hemoglobin: 13.6 g/dL (ref 13.0–17.0)
Lymphocytes Relative: 12 %
Lymphs Abs: 1.2 10*3/uL (ref 0.7–4.0)
MCH: 30.8 pg (ref 26.0–34.0)
MCHC: 34.1 g/dL (ref 30.0–36.0)
MCV: 90.3 fL (ref 78.0–100.0)
MONO ABS: 0.6 10*3/uL (ref 0.1–1.0)
MONOS PCT: 6 %
Neutro Abs: 8.1 10*3/uL — ABNORMAL HIGH (ref 1.7–7.7)
Neutrophils Relative %: 82 %
PLATELETS: 129 10*3/uL — AB (ref 150–400)
RBC: 4.42 MIL/uL (ref 4.22–5.81)
RDW: 13.3 % (ref 11.5–15.5)
WBC: 9.9 10*3/uL (ref 4.0–10.5)

## 2016-10-06 LAB — BASIC METABOLIC PANEL
Anion gap: 9 (ref 5–15)
BUN: 37 mg/dL — ABNORMAL HIGH (ref 6–20)
CALCIUM: 9.3 mg/dL (ref 8.9–10.3)
CO2: 25 mmol/L (ref 22–32)
CREATININE: 1.65 mg/dL — AB (ref 0.61–1.24)
Chloride: 105 mmol/L (ref 101–111)
GFR calc non Af Amer: 40 mL/min — ABNORMAL LOW (ref >60)
GFR, EST AFRICAN AMERICAN: 46 mL/min — AB (ref >60)
GLUCOSE: 122 mg/dL — AB (ref 65–99)
Potassium: 4.7 mmol/L (ref 3.5–5.1)
Sodium: 139 mmol/L (ref 135–145)

## 2016-10-06 LAB — PROTIME-INR
INR: 1.1
INR: 1.13
PROTHROMBIN TIME: 14.3 s (ref 11.4–15.2)
Prothrombin Time: 14.6 seconds (ref 11.4–15.2)

## 2016-10-06 LAB — TYPE AND SCREEN
ABO/RH(D): A POS
Antibody Screen: NEGATIVE

## 2016-10-06 LAB — APTT: aPTT: 26 seconds (ref 24–36)

## 2016-10-06 LAB — ABO/RH: ABO/RH(D): A POS

## 2016-10-06 MED ORDER — METHOCARBAMOL 1000 MG/10ML IJ SOLN
500.0000 mg | Freq: Once | INTRAVENOUS | Status: AC
  Administered 2016-10-06: 500 mg via INTRAVENOUS
  Filled 2016-10-06: qty 5

## 2016-10-06 MED ORDER — POLYETHYLENE GLYCOL 3350 17 G PO PACK: 17.0000 g | PACK | Freq: Every day | ORAL | Status: DC | PRN

## 2016-10-06 MED ORDER — ADULT MULTIVITAMIN W/MINERALS CH
1.0000 | ORAL_TABLET | Freq: Every day | ORAL | Status: DC
  Administered 2016-10-07 – 2016-10-12 (×5): 1 via ORAL
  Filled 2016-10-06 (×5): qty 1

## 2016-10-06 MED ORDER — LISINOPRIL-HYDROCHLOROTHIAZIDE 10-12.5 MG PO TABS: 1.0000 | ORAL_TABLET | Freq: Every day | ORAL | Status: DC

## 2016-10-06 MED ORDER — METHOCARBAMOL 1000 MG/10ML IJ SOLN
500.0000 mg | Freq: Four times a day (QID) | INTRAVENOUS | Status: DC | PRN
  Filled 2016-10-06: qty 5

## 2016-10-06 MED ORDER — SODIUM CHLORIDE 0.9 % IV BOLUS (SEPSIS)
500.0000 mL | Freq: Once | INTRAVENOUS | Status: AC
  Administered 2016-10-06: 500 mL via INTRAVENOUS

## 2016-10-06 MED ORDER — DIAZEPAM 5 MG/ML IJ SOLN
2.5000 mg | Freq: Once | INTRAMUSCULAR | Status: AC
  Administered 2016-10-06: 2.5 mg via INTRAVENOUS
  Filled 2016-10-06: qty 2

## 2016-10-06 MED ORDER — HYDROMORPHONE HCL 1 MG/ML IJ SOLN
0.5000 mg | INTRAMUSCULAR | Status: DC | PRN
  Administered 2016-10-09 – 2016-10-10 (×2): 1 mg via INTRAVENOUS
  Filled 2016-10-06 (×2): qty 1

## 2016-10-06 MED ORDER — LATANOPROST 0.005 % OP SOLN
1.0000 [drp] | Freq: Every day | OPHTHALMIC | Status: DC
  Administered 2016-10-07 – 2016-10-11 (×6): 1 [drp] via OPHTHALMIC
  Filled 2016-10-06: qty 2.5

## 2016-10-06 MED ORDER — METHOCARBAMOL 500 MG PO TABS
500.0000 mg | ORAL_TABLET | Freq: Four times a day (QID) | ORAL | Status: DC | PRN
  Administered 2016-10-07 – 2016-10-10 (×3): 500 mg via ORAL
  Filled 2016-10-06 (×4): qty 1

## 2016-10-06 MED ORDER — OXYCODONE HCL 5 MG PO TABS
5.0000 mg | ORAL_TABLET | ORAL | Status: DC | PRN
  Administered 2016-10-07 – 2016-10-08 (×3): 10 mg via ORAL
  Filled 2016-10-06 (×3): qty 2

## 2016-10-06 MED ORDER — ENOXAPARIN SODIUM 40 MG/0.4ML ~~LOC~~ SOLN
40.0000 mg | SUBCUTANEOUS | Status: DC
  Administered 2016-10-07 – 2016-10-12 (×5): 40 mg via SUBCUTANEOUS
  Filled 2016-10-06 (×6): qty 0.4

## 2016-10-06 MED ORDER — CYCLOBENZAPRINE HCL 10 MG PO TABS
5.0000 mg | ORAL_TABLET | Freq: Once | ORAL | Status: AC
  Administered 2016-10-06: 5 mg via ORAL
  Filled 2016-10-06: qty 1

## 2016-10-06 MED ORDER — METHOCARBAMOL 1000 MG/10ML IJ SOLN: 500.0000 mg | Freq: Once | INTRAMUSCULAR | Status: DC

## 2016-10-06 MED ORDER — DOCUSATE SODIUM 100 MG PO CAPS
100.0000 mg | ORAL_CAPSULE | Freq: Two times a day (BID) | ORAL | Status: DC
  Administered 2016-10-07 – 2016-10-12 (×9): 100 mg via ORAL
  Filled 2016-10-06 (×11): qty 1

## 2016-10-06 MED ORDER — LISINOPRIL 10 MG PO TABS: 10.0000 mg | ORAL_TABLET | Freq: Every day | ORAL | Status: DC

## 2016-10-06 MED ORDER — HYDROCHLOROTHIAZIDE 12.5 MG PO CAPS
12.5000 mg | ORAL_CAPSULE | Freq: Every day | ORAL | Status: DC
Start: 2016-10-07 — End: 2016-10-06

## 2016-10-06 MED ORDER — ASPIRIN EC 81 MG PO TBEC
81.0000 mg | DELAYED_RELEASE_TABLET | Freq: Every day | ORAL | Status: DC
  Administered 2016-10-07: 81 mg via ORAL
  Filled 2016-10-06: qty 1

## 2016-10-06 MED ORDER — OXYCODONE-ACETAMINOPHEN 5-325 MG PO TABS
1.0000 | ORAL_TABLET | ORAL | Status: DC | PRN
  Administered 2016-10-06: 1 via ORAL
  Administered 2016-10-07 – 2016-10-08 (×7): 2 via ORAL
  Filled 2016-10-06: qty 1
  Filled 2016-10-06 (×7): qty 2

## 2016-10-06 MED ORDER — SODIUM CHLORIDE 0.9 % IV SOLN
INTRAVENOUS | Status: DC
  Administered 2016-10-08 – 2016-10-09 (×3): via INTRAVENOUS

## 2016-10-06 NOTE — ED Triage Notes (Signed)
Pt brought in by EMS after having a fall at home. Pt bent over to pick something up and had a "dizzy spell". Pt has open wound on right lower leg. Per EMS pt has obvious deformity to right lower leg.

## 2016-10-06 NOTE — Progress Notes (Signed)
Orthopedic Tech Progress Note Patient Details:  Patrick Good [birth year] [id number]  Ortho Devices Type of Ortho Device: Ace wrap, Post (short leg) splint, Stirrup splint Ortho Device/Splint Location: RLE Ortho Device/Splint Interventions: Ordered, Application   Braulio Bosch 10/06/2016, 7:57 PM

## 2016-10-06 NOTE — ED Notes (Signed)
Patient transported to X-ray 

## 2016-10-06 NOTE — ED Provider Notes (Signed)
[facility] DEPT Provider Note   CSN: [id number] Arrival date & time: 10/06/16  1714     History   Chief Complaint Chief Complaint  Patient presents with  . Fall  . Leg Injury    HPI Patrick Good is a 76 y.o. male.  HPI Patient has a right lower leg injury. States he has been dizzy when he stood up for the last few months. States he has been losing weight under his doctor's direction is down from 31 pounds. States that today after mowing the lawn he came back home and stood up and fell, and felt a crack in his right lower leg. States the foot is pointing the wrong direction and the skin was stretched by the bone but the bone did not stick through. No loss of consciousness. Last ate at 11:30 this morning. He did however have a few sips of water prior to arriving to the ER.   Past Medical History:  Diagnosis Date  . History of ankle fracture 1982   spiral break; left ankle  . History of chicken pox   . History of measles   . Hypertension     Patient Active Problem List   Diagnosis Date Noted  . Tibia/fibula fracture 10/06/2016  . History of colonic polyps 05/14/2015  . Cramps of lower extremity 05/14/2015  . Elevated IOP 05/14/2015  . Hemorrhoid 05/14/2015  . 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 Essential (primary) hypertension 09/13/2007    Past Surgical History:  Procedure Laterality Date  . HERNIA REPAIR  1946 and 1952  . KNEE SURGERY  12/2011   Left meniscus by Dr. Wylene Simmer in [place]  . Myocardial Perfusion Scan  07/29/2011   Normal  . [place] Medications    Prior to Admission medications   Medication Sig Start Date End Date Taking? Authorizing Provider  aspirin 81 MG tablet Take 1 tablet by mouth daily. 09/13/07  Yes [provider]  ibuprofen (ADVIL,MOTRIN) 200 MG tablet Take 400 mg by mouth as needed for headache.   Yes [provider]  latanoprost (XALATAN) 0.005 % ophthalmic solution Place 1 drop  into both eyes at bedtime.    Yes [provider]  lisinopril-hydrochlorothiazide (PRINZIDE,ZESTORETIC) 10-12.5 MG tablet TAKE 1 TABLET DAILY 04/29/16  Yes [name], MD  Multiple Vitamins-Minerals (MULTIVITAMIN ADULT PO) Take 1 tablet by mouth daily. 09/13/07  Yes [provider]  Multiple Vitamins-Minerals (VISION VITAMINS PO) Take by mouth.   Yes [provider]  nystatin cream (MYCOSTATIN) Apply 1 application topically 2 (two) times daily. Patient taking differently: Apply 1 application topically as needed (rash).  10/02/16  Yes [name], MD  triamcinolone ointment (KENALOG) 0.5 % Apply 1 application topically 2 (two) times daily. To rash on forearm 10/02/16  Yes [name], MD    Family History Family History  Problem Relation Age of Onset  . Heart Problems Sister   . Diabetes type II Sister   . Breast cancer Sister   . Multiple sclerosis Sister     Social History Social History  Substance Use Topics  . Smoking status: Former Smoker    Packs/day: 0.50    Years: 25.00    Types: Cigarettes    Quit date: 05/04/1992  . Smokeless tobacco: Never Used  . Alcohol use No     Allergies   Patient has no known allergies.   Review of Systems Review of Systems  Constitutional: Negative  for appetite change.  HENT: Negative for dental problem.   Respiratory: Negative for shortness of breath.   Cardiovascular: Negative for chest pain.  Gastrointestinal: Negative for abdominal pain.  Genitourinary: Negative for flank pain.  Musculoskeletal: Negative for back pain.  Skin: Positive for wound.  Neurological: Positive for dizziness.  Psychiatric/Behavioral: Negative for confusion.     Physical Exam Updated Vital Signs BP 137/71 (BP Location: Left Arm)   Pulse 83   Temp 99.5 F (37.5 C) (Oral)   Resp 18   Ht 6\' 3"  (1.905 m)   Wt 114.8 kg (253 lb)   SpO2 97%   BMI 31.62 kg/m   Physical Exam  Constitutional: He appears  well-developed.  HENT:  Head: Atraumatic.  Neck: Neck supple.  Cardiovascular: Normal rate.   Pulmonary/Chest: Effort normal.  Abdominal: There is no tenderness.  Musculoskeletal: He exhibits tenderness.  Tenderness to right lower leg. There is an anterior deformity with some external rotation of the foot and ankle. There is an abrasion with some retraction of the skin. No clear laceration. Neurovascular intact in the right foot. Some tenderness for the deformity and laterally. Also mild tenderness proximally over the head of the fibula.  Neurological: He is alert.  Skin: Skin is warm. Capillary refill takes less than 2 seconds.  Psychiatric: He has a normal mood and affect.     ED Treatments / Results  Labs (all labs ordered are listed, but only abnormal results are displayed) Labs Reviewed  BASIC METABOLIC PANEL - Abnormal; Notable for the following:       Result Value   Glucose, Bld 122 (*)    BUN 37 (*)    Creatinine, Ser 1.65 (*)    GFR calc non Af Amer 40 (*)    GFR calc Af Amer 46 (*)    All other components within normal limits  CBC WITH DIFFERENTIAL/PLATELET - Abnormal; Notable for the following:    Platelets 129 (*)    Neutro Abs 8.1 (*)    All other components within normal limits  PROTIME-INR  PROTIME-INR  URINALYSIS, ROUTINE W REFLEX MICROSCOPIC  APTT  BASIC METABOLIC PANEL  TYPE AND SCREEN  ABO/RH    EKG  EKG Interpretation  Date/Time:  Tuesday October 06 2016 17:25:35 EDT Ventricular Rate:  83 PR Interval:    QRS Duration: 113 QT Interval:  370 QTC Calculation: 435 R Axis:   -66 Text Interpretation:  Sinus rhythm Left anterior fascicular block Consider right ventricular hypertrophy Probable anteroseptal infarct, old Confirmed by Alvino Chapel  MD, Myrtle Barnhard [phone]) on 10/06/2016 5:30:25 PM       Radiology Dg Chest 1 View  Result Date: 10/06/2016 CLINICAL DATA:  Leg bathroom EXAM: CHEST 1 VIEW COMPARISON:  None. FINDINGS: No acute consolidation or effusion.  Normal cardiomediastinal silhouette with atherosclerosis. No pneumothorax. IMPRESSION: No active disease. Electronically Signed   By: Donavan Foil M.D.   On: 10/06/2016 18:44   Dg Tibia/fibula Right  Result Date: 10/06/2016 CLINICAL DATA:  Lower leg fracture EXAM: RIGHT TIBIA AND FIBULA - 2 VIEW COMPARISON:  None. FINDINGS: Comminuted fracture involving the distal shaft of the tibia with fracture lucencies extending to the metaphysis of the distal tibia. Approximately 1/2 shaft diameter of lateral displacement of distal fracture fragments and about 1 cm of separation of bony fragments. Additional comminuted fracture involving the proximal metaphysis and shaft of the fibula with a bowel 1/4 shaft diameter of anterior displacement of distal fracture fragment. Degenerative changes at the right knee. IMPRESSION:  1. Comminuted displaced and slightly separated distal tibial fracture 2. Comminuted and mildly displaced proximal fibular fracture Electronically Signed   By: Donavan Foil M.D.   On: 10/06/2016 18:46   Ct Tibia Fibula Right Wo Contrast  Result Date: 10/06/2016 CLINICAL DATA:  Patient fell at home today.  Right leg pain. EXAM: CT OF THE LOWER RIGHT EXTREMITY WITHOUT CONTRAST TECHNIQUE: Multidetector CT imaging of the right lower extremity was performed according to the standard protocol. COMPARISON:  Radiographs from earlier on the same day. FINDINGS: Bones/Joint/Cartilage 1. Acute, closed comminuted fibular neck and head fracture with slight anterior displacement of fibular shaft by 2 mm. 2. Acute, closed comminuted fracture at the junction of the middle and distal third of the tibial shaft with 6 mm of lateral and 4 mm of dorsal displacement of the main fracture fragment. 3. Coronally oriented posterior malleolar fracture without displacement. 4. Anterolateral distal tibial epiphyseal fracture extending into the ankle joint with negligible lateral displacement of the fracture fragment. No dislocation at  the knee or ankle. Subtalar and midfoot articulations to the extent visualized are intact. Ligaments Suboptimally assessed by CT. Muscles and Tendons Negative Soft tissues Soft tissue swelling and subcutaneous edema about the distal leg and ankle. IMPRESSION: 1. Acute, closed comminuted fibular neck and head fracture with slight anterior displacement of fibular shaft by 2 mm. 2. Acute, closed comminuted fracture at the junction of the middle and distal third of the tibial shaft with 6 mm of lateral and 4 mm of dorsal displacement of the main fracture fragment. 3. Coronally oriented posterior malleolar fracture without displacement. 4. Anterolateral distal tibial epiphyseal fracture extending into the ankle joint with negligible lateral displacement of the fracture fragment. 5. No dislocation at the knee or ankle. Subtalar and midfoot articulations to the extent visualized are intact. Electronically Signed   By: Ashley Royalty M.D.   On: 10/06/2016 21:43    Procedures Procedures (including critical care time)  Medications Ordered in ED Medications  oxyCODONE-acetaminophen (PERCOCET/ROXICET) 5-325 MG per tablet 1-2 tablet (1 tablet Oral Given 10/06/16 2101)  aspirin EC tablet 81 mg (not administered)  latanoprost (XALATAN) 0.005 % ophthalmic solution 1 drop (not administered)  multivitamin with minerals tablet 1 tablet (not administered)  enoxaparin (LOVENOX) injection 40 mg (not administered)  0.9 %  sodium chloride infusion (not administered)  oxyCODONE (Oxy IR/ROXICODONE) immediate release tablet 5-10 mg (not administered)  HYDROmorphone (DILAUDID) injection 0.5-1 mg (not administered)  methocarbamol (ROBAXIN) tablet 500 mg (not administered)    Or  methocarbamol (ROBAXIN) 500 mg in dextrose 5 % 50 mL IVPB (not administered)  docusate sodium (COLACE) capsule 100 mg (not administered)  polyethylene glycol (MIRALAX / GLYCOLAX) packet 17 g (not administered)  methocarbamol (ROBAXIN) 500 mg in dextrose  5 % 50 mL IVPB (0 mg Intravenous Stopped 10/06/16 1913)  sodium chloride 0.9 % bolus 500 mL (0 mLs Intravenous Stopped 10/06/16 2010)  diazepam (VALIUM) injection 2.5 mg (2.5 mg Intravenous Given 10/06/16 2021)  cyclobenzaprine (FLEXERIL) tablet 5 mg (5 mg Oral Given 10/06/16 2142)     Initial Impression / Assessment and Plan / ED Course  I have reviewed the triage vital signs and the nursing notes.  Pertinent labs & imaging results that were available during my care of the patient were reviewed by me and considered in my medical decision making (see chart for details).     Patient with renal insufficiency. Mild increased from baseline. Appears to have some orthostatic changes. He did however have a  fall has a distal tibia fracture and proximal fibular fracture. Seen by orthopedic surgery, Dr. Veverly Fells. Will admit with medicine consult.  Final Clinical Impressions(s) / ED Diagnoses   Final diagnoses:  Closed fracture of distal end of left tibia, unspecified fracture morphology, initial encounter  Closed fracture of proximal end of right fibula, unspecified fracture morphology, initial encounter  Renal insufficiency    New Prescriptions Current Discharge Medication List       Davonna Belling, MD 10/06/16 2246

## 2016-10-06 NOTE — H&P (Signed)
EDMON MAGID is an 76 y.o. male.   Chief Complaint: right leg pain HPI: 76 yo male who had a ground level fall due to a brief syncopal episode resulting in a right leg injury.  Patient bent over after mowing his lawn and then upon rising got dizzy and fell hearing his leg "snap".  Patient transported by EMS to [facility] for evaluation of obvious right leg injury.  Past Medical History:  Diagnosis Date  . History of ankle fracture 1982   spiral break; left ankle  . History of chicken pox   . History of measles   . Hypertension     Past Surgical History:  Procedure Laterality Date  . HERNIA REPAIR  1946 and 1952  . KNEE SURGERY  12/2011   Left meniscus by Dr. Wylene Simmer in [place]  . Myocardial Perfusion Scan  07/29/2011   Normal  . VASECTOMY  1972    Family History  Problem Relation Age of Onset  . Heart Problems Sister   . Diabetes type II Sister   . Breast cancer Sister   . Multiple sclerosis Sister    Social History:  reports that he quit smoking about 24 years ago. His smoking use included Cigarettes. He has a 12.50 pack-year smoking history. He has never used smokeless tobacco. He reports that he does not drink alcohol or use drugs.  Allergies: No Known Allergies   (Not in a hospital admission)  Results for orders placed or performed during the hospital encounter of 10/06/16 (from the past 48 hour(s))  Basic metabolic panel     Status: Abnormal   Collection Time: 10/06/16  5:55 PM  Result Value Ref Range   Sodium 139 135 - 145 mmol/L   Potassium 4.7 3.5 - 5.1 mmol/L   Chloride 105 101 - 111 mmol/L   CO2 25 22 - 32 mmol/L   Glucose, Bld 122 (H) 65 - 99 mg/dL   BUN 37 (H) 6 - 20 mg/dL   Creatinine, Ser 1.65 (H) 0.61 - 1.24 mg/dL   Calcium 9.3 8.9 - 10.3 mg/dL   GFR calc non Af Amer 40 (L) >60 mL/min   GFR calc Af Amer 46 (L) >60 mL/min    Comment: (NOTE) The eGFR has been calculated using the CKD EPI equation. This calculation has not been validated in all  clinical situations. eGFR's persistently <60 mL/min signify possible Chronic Kidney Disease.    Anion gap 9 5 - 15  CBC WITH DIFFERENTIAL     Status: Abnormal   Collection Time: 10/06/16  5:55 PM  Result Value Ref Range   WBC 9.9 4.0 - 10.5 K/uL   RBC 4.42 4.22 - 5.81 MIL/uL   Hemoglobin 13.6 13.0 - 17.0 g/dL   HCT 39.9 39.0 - 52.0 %   MCV 90.3 78.0 - 100.0 fL   MCH 30.8 26.0 - 34.0 pg   MCHC 34.1 30.0 - 36.0 g/dL   RDW 13.3 11.5 - 15.5 %   Platelets 129 (L) 150 - 400 K/uL   Neutrophils Relative % 82 %   Neutro Abs 8.1 (H) 1.7 - 7.7 K/uL   Lymphocytes Relative 12 %   Lymphs Abs 1.2 0.7 - 4.0 K/uL   Monocytes Relative 6 %   Monocytes Absolute 0.6 0.1 - 1.0 K/uL   Eosinophils Relative 0 %   Eosinophils Absolute 0.0 0.0 - 0.7 K/uL   Basophils Relative 0 %   Basophils Absolute 0.0 0.0 - 0.1 K/uL  Protime-INR  Status: None   Collection Time: 10/06/16  5:55 PM  Result Value Ref Range   Prothrombin Time 14.3 11.4 - 15.2 seconds   INR 1.10   Type and screen [place]     Status: None   Collection Time: 10/06/16  5:55 PM  Result Value Ref Range   ABO/RH(D) A POS    Antibody Screen NEG    Sample Expiration 10/09/2016   ABO/Rh     Status: None   Collection Time: 10/06/16  5:55 PM  Result Value Ref Range   ABO/RH(D) A POS    Dg Chest 1 View  Result Date: 10/06/2016 CLINICAL DATA:  Leg bathroom EXAM: CHEST 1 VIEW COMPARISON:  None. FINDINGS: No acute consolidation or effusion. Normal cardiomediastinal silhouette with atherosclerosis. No pneumothorax. IMPRESSION: No active disease. Electronically Signed   By: Donavan Foil M.D.   On: 10/06/2016 18:44   Dg Tibia/fibula Right  Result Date: 10/06/2016 CLINICAL DATA:  Lower leg fracture EXAM: RIGHT TIBIA AND FIBULA - 2 VIEW COMPARISON:  None. FINDINGS: Comminuted fracture involving the distal shaft of the tibia with fracture lucencies extending to the metaphysis of the distal tibia. Approximately 1/2 shaft diameter  of lateral displacement of distal fracture fragments and about 1 cm of separation of bony fragments. Additional comminuted fracture involving the proximal metaphysis and shaft of the fibula with a bowel 1/4 shaft diameter of anterior displacement of distal fracture fragment. Degenerative changes at the right knee. IMPRESSION: 1. Comminuted displaced and slightly separated distal tibial fracture 2. Comminuted and mildly displaced proximal fibular fracture Electronically Signed   By: Donavan Foil M.D.   On: 10/06/2016 18:46    ROS denies CP, SOB, other joint complaints  Blood pressure 108/62, pulse 78, resp. rate 16, height '6\' 3"'  (1.905 m), weight 114.8 kg (253 lb), SpO2 100 %.   Physical Exam Healthy appearing male in NAD, neck is non tender with normal ROM,  Bilateral UEs with no deformity and no pain with normal AROM Left LE with normal ROM and no pain, NVI Right LE with moderate swelling, and skin dimple, no drainage or bleeding, ankle externally rotated versus the knee. NVI distally  Assessment/Plan Right distal tibia comminute and displaced fracture with associated proximal fibula fracture.  Unstable pattern with joint extension Will obtain CT scan of the tibia/fibula to evaluate the extent of joint involvement and obliquity of fracture lines for possible percutaneous screw and IM nail fixation. I have discussed this case with Dr Marcelino Scot who will consult on the case - thank you. Admit - elevate, pain control,  and DVT prophylaxis  Richardine Peppers,STEVEN R, MD 10/06/2016, 8:16 PM

## 2016-10-06 NOTE — Consult Note (Signed)
Medical Consultation   Patrick Good  PJA:[id number]  DOB: 1940/11/22  DOA: 10/06/2016  PCP: [name], MD   Requesting physician: Dr. Veverly Fells  Reason for consultation: Medical management    History of Present Illness: Patrick Good is an 76 y.o. male with past medical hx of HTN, pre-diabetes and has been working on intentional weight loss and has lost about 30 pounds with diet, glaucoma, who presents to the ED with chief complaint of syncopal episode resulting in right leg injury. He states that whenever he gets up too fast, he gets episodes of dizziness and feeling like he will pass out. This happens nearly every other day. He states that today, he mowed the lawn, came back inside and was doing house chores when he bent down then got up too fast. He had the sensation of dizziness and feeling faint; he tried to grab onto the chair to steady himself but loss of consciousness for a few seconds. This syncopal episode resulted in right tibia and fibula fracture. Currently in the emergency department, patient is complaining of right leg pain and muscle spasm despite medications received in the emergency department. Orthopedic surgery service to admit and medicine to consult.   Review of Systems:  Review of Systems  Constitutional: Negative for chills and fever.  HENT: Negative.   Eyes: Negative.   Respiratory: Negative for cough and shortness of breath.   Cardiovascular: Negative for chest pain and palpitations.  Gastrointestinal: Negative for abdominal pain, diarrhea, nausea and vomiting.  Genitourinary: Negative for dysuria.  Musculoskeletal:       Right leg pain and muscle spasms  Skin: Negative.   Neurological: Positive for dizziness and loss of consciousness.   As per HPI otherwise 10 point review of systems negative.   Past Medical History: Past Medical History:  Diagnosis Date  . History of ankle fracture 1982   spiral break; left ankle  . History  of chicken pox   . History of measles   . Hypertension     Past Surgical History: Past Surgical History:  Procedure Laterality Date  . HERNIA REPAIR  1946 and 1952  . KNEE SURGERY  12/2011   Left meniscus by Dr. Wylene Simmer in [place]  . Myocardial Perfusion Scan  07/29/2011   Normal  . VASECTOMY  1972    Allergies:  No Known Allergies   Social History:  reports that he quit smoking about 24 years ago. His smoking use included Cigarettes. He has a 12.50 pack-year smoking history. He has never used smokeless tobacco. He reports that he does not drink alcohol or use drugs.   Family History: Family History  Problem Relation Age of Onset  . Heart Problems Sister   . Diabetes type II Sister   . Breast cancer Sister   . Multiple sclerosis Sister     Physical Exam: Vitals:   10/06/16 2000 10/06/16 2015 10/06/16 2030 10/06/16 2045  BP: (!) 156/70 132/71 124/76 139/63  Pulse: 94 84 85 89  Resp: 12 18 20 16   SpO2: 98% 98% 98% 99%  Weight:      Height:        Constitutional:  Alert and awake, oriented x3, not in any acute distress, appears uncomfortable  Eyes: PERLA, EOMI, irises appear normal, anicteric sclera,  ENMT: external ears and nose appear normal, Lips appears normal, oropharynx mucosa, tongue, posterior pharynx appear normal  Neck: neck  appears normal, no masses, normal ROM, no thyromegaly, no JVD  CVS: S1-S2 clear, no murmur rubs or gallops, no LE edema, normal pedal pulses  Respiratory:  clear to auscultation bilaterally, no wheezing, rales or rhonchi. Respiratory effort normal. No accessory muscle use.  Abdomen: soft nontender, nondistended, normal bowel sounds, no hepatosplenomegaly, no hernias  Musculoskeletal: : no cyanosis, clubbing, right lower extremity in cast  Neuro: Cranial nerves II-XII intact, non focal exam  Psych: judgement and insight appear normal, stable mood and affect Skin: no rashes or lesions or ulcers, no induration or nodules   Data  reviewed:  I have personally reviewed following labs and imaging studies Labs:  CBC:  Recent Labs Lab 10/06/16 1755  WBC 9.9  NEUTROABS 8.1*  HGB 13.6  HCT 39.9  MCV 90.3  PLT 129*    Basic Metabolic Panel:  Recent Labs Lab 10/06/16 1755  NA 139  K 4.7  CL 105  CO2 25  GLUCOSE 122*  BUN 37*  CREATININE 1.65*  CALCIUM 9.3   GFR Estimated Creatinine Clearance: 54.5 mL/min (A) (by C-G formula based on SCr of 1.65 mg/dL (H)). Liver Function Tests: No results for input(s): AST, ALT, ALKPHOS, BILITOT, PROT, ALBUMIN in the last 168 hours. No results for input(s): LIPASE, AMYLASE in the last 168 hours. No results for input(s): AMMONIA in the last 168 hours. Coagulation profile  Recent Labs Lab 10/06/16 1755  INR 1.10    Cardiac Enzymes: No results for input(s): CKTOTAL, CKMB, CKMBINDEX, TROPONINI in the last 168 hours. BNP: Invalid input(s): POCBNP CBG: No results for input(s): GLUCAP in the last 168 hours. D-Dimer No results for input(s): DDIMER in the last 72 hours. Hgb A1c No results for input(s): HGBA1C in the last 72 hours. Lipid Profile No results for input(s): CHOL, HDL, LDLCALC, TRIG, CHOLHDL, LDLDIRECT in the last 72 hours. Thyroid function studies No results for input(s): TSH, T4TOTAL, T3FREE, THYROIDAB in the last 72 hours.  Invalid input(s): FREET3 Anemia work up No results for input(s): VITAMINB12, FOLATE, FERRITIN, TIBC, IRON, RETICCTPCT in the last 72 hours. Urinalysis No results found for: COLORURINE, APPEARANCEUR, LABSPEC, [place], GLUCOSEU, HGBUR, BILIRUBINUR, KETONESUR, PROTEINUR, UROBILINOGEN, NITRITE, Pet[place]   Microbiology No results found for this or any previous visit (from the past 240 hour(s)).     Inpatient Medications:   Scheduled Meds: . [START ON 10/07/2016] aspirin EC  81 mg Oral Daily  . docusate sodium  100 mg Oral BID  . [START ON 10/07/2016] enoxaparin (LOVENOX) injection  40 mg Subcutaneous Q24H  . [START ON  10/07/2016] lisinopril  10 mg Oral Daily   And  . [START ON 10/07/2016] hydrochlorothiazide  12.5 mg Oral Daily  . latanoprost  1 drop Both Eyes QHS  . [START ON 10/07/2016] multivitamin with minerals  1 tablet Oral Daily   Continuous Infusions: . sodium chloride    . methocarbamol (ROBAXIN)  IV       Radiological Exams on Admission: Dg Chest 1 View  Result Date: 10/06/2016 CLINICAL DATA:  Leg bathroom EXAM: CHEST 1 VIEW COMPARISON:  None. FINDINGS: No acute consolidation or effusion. Normal cardiomediastinal silhouette with atherosclerosis. No pneumothorax. IMPRESSION: No active disease. Electronically Signed   By: Donavan Foil M.D.   On: 10/06/2016 18:44   Dg Tibia/fibula Right  Result Date: 10/06/2016 CLINICAL DATA:  Lower leg fracture EXAM: RIGHT TIBIA AND FIBULA - 2 VIEW COMPARISON:  None. FINDINGS: Comminuted fracture involving the distal shaft of the tibia with fracture lucencies extending to the metaphysis  of the distal tibia. Approximately 1/2 shaft diameter of lateral displacement of distal fracture fragments and about 1 cm of separation of bony fragments. Additional comminuted fracture involving the proximal metaphysis and shaft of the fibula with a bowel 1/4 shaft diameter of anterior displacement of distal fracture fragment. Degenerative changes at the right knee. IMPRESSION: 1. Comminuted displaced and slightly separated distal tibial fracture 2. Comminuted and mildly displaced proximal fibular fracture Electronically Signed   By: Donavan Foil M.D.   On: 10/06/2016 18:46   EKG reviewed independently, NSR with left axis deviation, left fasc block, no ST change   Impression/Recommendations Active Problems:   Tibia/fibula fracture  Right tib/fib fracture -Per primary service  Syncope -Likely due to orthostatic by history. Would recommend holding antihypertensives. No hx of chest pain, heart palpitations, seizure like activity, and no murmur on exam  -Unable to check orthostatic  VS due to patient's leg fx and bedrest status  -Check echocardiogram  -Tele 24 hours   CKD stage 3 -Baseline Cr 1.4 since Sept 2017   -Repeat BMP in AM   Pre-diabetes -Last Ha1c 5.9   HTN -Well controlled as outpatient, most recent PCP visit with BP 120/70. Hold antihypertensives for now.    Thank you for this consultation.  Our [facility] hospitalist team will follow the patient with you.   Time Spent: 40 minutes   Dessa Phi D.O. [facility] 10/06/2016, 9:23 PM

## 2016-10-06 NOTE — ED Notes (Signed)
Patient transported to CT 

## 2016-10-07 ENCOUNTER — Inpatient Hospital Stay (HOSPITAL_COMMUNITY): Payer: Medicare Other

## 2016-10-07 DIAGNOSIS — S82302A Unspecified fracture of lower end of left tibia, initial encounter for closed fracture: Secondary | ICD-10-CM

## 2016-10-07 LAB — SURGICAL PCR SCREEN
MRSA, PCR: INVALID — AB
Staphylococcus aureus: INVALID — AB

## 2016-10-07 LAB — BASIC METABOLIC PANEL
Anion gap: 9 (ref 5–15)
BUN: 33 mg/dL — AB (ref 6–20)
CO2: 26 mmol/L (ref 22–32)
CREATININE: 1.25 mg/dL — AB (ref 0.61–1.24)
Calcium: 8.8 mg/dL — ABNORMAL LOW (ref 8.9–10.3)
Chloride: 105 mmol/L (ref 101–111)
GFR calc Af Amer: >60 mL/min (ref >60)
GFR calc non Af Amer: 55 mL/min — ABNORMAL LOW (ref >60)
Glucose, Bld: 117 mg/dL — ABNORMAL HIGH (ref 65–99)
POTASSIUM: 3.9 mmol/L (ref 3.5–5.1)
SODIUM: 140 mmol/L (ref 135–145)

## 2016-10-07 LAB — URINALYSIS, ROUTINE W REFLEX MICROSCOPIC
Bilirubin Urine: NEGATIVE
Glucose, UA: NEGATIVE mg/dL
HGB URINE DIPSTICK: NEGATIVE
Ketones, ur: NEGATIVE mg/dL
Leukocytes, UA: NEGATIVE
NITRITE: NEGATIVE
PROTEIN: NEGATIVE mg/dL
Specific Gravity, Urine: 1.023 (ref 1.005–1.030)
pH: 5 (ref 5.0–8.0)

## 2016-10-07 LAB — GLUCOSE, CAPILLARY
Glucose-Capillary: 94 mg/dL (ref 65–99)
Glucose-Capillary: 121 mg/dL — ABNORMAL HIGH (ref 65–99)

## 2016-10-07 MED ORDER — LIDOCAINE HCL 2 % IJ SOLN
5.0000 mL | Freq: Once | INTRAMUSCULAR | Status: AC
  Filled 2016-10-07: qty 10

## 2016-10-07 MED ORDER — BUPIVACAINE HCL (PF) 0.5 % IJ SOLN
10.0000 mL | Freq: Once | INTRAMUSCULAR | Status: AC
  Filled 2016-10-07: qty 10

## 2016-10-07 MED ORDER — METHOCARBAMOL 500 MG PO TABS
500.0000 mg | ORAL_TABLET | Freq: Three times a day (TID) | ORAL | Status: DC
  Administered 2016-10-07 – 2016-10-12 (×17): 500 mg via ORAL
  Filled 2016-10-07 (×17): qty 1

## 2016-10-07 MED ORDER — INSULIN ASPART 100 UNIT/ML ~~LOC~~ SOLN: 0.0000 [IU] | Freq: Three times a day (TID) | SUBCUTANEOUS | Status: DC

## 2016-10-07 MED ORDER — POLYETHYLENE GLYCOL 3350 17 G PO PACK
17.0000 g | PACK | Freq: Every day | ORAL | Status: DC
  Administered 2016-10-07 – 2016-10-08 (×2): 17 g via ORAL
  Filled 2016-10-07 (×2): qty 1

## 2016-10-07 NOTE — Progress Notes (Signed)
Orthopedics Progress Note  Subjective: Complaining of calf spasms last night.  Objective:  Vitals:   10/06/16 2207 10/07/16 0344  BP: 137/71 111/62  Pulse: 83 69  Resp: 18 18  Temp: 99.5 F (37.5 C) 98.9 F (37.2 C)    General: Awake and alert  Musculoskeletal: right leg splinted and elevated. Able to wiggle toes this morning without pain Neurovascularly intact  Lab Results  Component Value Date   WBC 9.9 10/06/2016   HGB 13.6 10/06/2016   HCT 39.9 10/06/2016   MCV 90.3 10/06/2016   PLT 129 (L) 10/06/2016       Component Value Date/Time   NA 140 10/07/2016 0518   NA 143 05/15/2015 1007   K 3.9 10/07/2016 0518   CL 105 10/07/2016 0518   CO2 26 10/07/2016 0518   GLUCOSE 117 (H) 10/07/2016 0518   BUN 33 (H) 10/07/2016 0518   BUN 25 05/15/2015 1007   CREATININE 1.25 (H) 10/07/2016 0518   CALCIUM 8.8 (L) 10/07/2016 0518   GFRNONAA 55 (L) 10/07/2016 0518   GFRAA >60 10/07/2016 0518    Lab Results  Component Value Date   INR 1.13 10/06/2016   INR 1.10 10/06/2016    Assessment/Plan: Day #1 s/p fall with closed tibia/fibula fracture. CT scan shows medial malleolus fracture and extension into the ankle joint but no displacement Pain control and optimization for likely surgery tomorrow. Needs Lovenox - did not get ordered dose last night. I have made nursing aware of this. Mechanical SCDs and overhead trapeze Dr Marcelino Scot to consult and assume care - thank you!   Doran Heater. Veverly Fells, MD 10/07/2016 7:50 AM

## 2016-10-07 NOTE — Consult Note (Signed)
Orthopaedic Trauma Service Consultation  Reason for Consult: Complex right tibia fracture with intraarticular extension Referring Physician: Netta Cedars, MD  Gaylene Brooks Liberman is an 76 y.o. male.  HPI: Ground level fall after syncopal event when rising from squatting position. Had been mowing lawn earlier. Hit face too but no pain now. Denies numbness and tingling. Given the location and complexity of the tibia fracture, Dr. Veverly Fells asserted this was outside his scope of practice and that it would be in the best interest of the patient to have these injuries evaluated and treated by a fellowship trained orthopaedic traumatologist. Consequently, I was consulted to provide further evaluation and management, which we were pleased to do.  Past Medical History:  Diagnosis Date  . History of ankle fracture 1982   spiral break; left ankle  . History of chicken pox   . History of measles   . Hypertension     Past Surgical History:  Procedure Laterality Date  . HERNIA REPAIR  1946 and 1952  . KNEE SURGERY  12/2011   Left meniscus by Dr. Wylene Simmer in [place]  . Myocardial Perfusion Scan  07/29/2011   Normal  . VASECTOMY  1972    Family History  Problem Relation Age of Onset  . Heart Problems Sister   . Diabetes type II Sister   . Breast cancer Sister   . Multiple sclerosis Sister     Social History:  reports that he quit smoking about 24 years ago. His smoking use included Cigarettes. He has a 12.50 pack-year smoking history. He has never used smokeless tobacco. He reports that he does not drink alcohol or use drugs.  Allergies: No Known Allergies  Medications:  Prior to Admission:  Prescriptions Prior to Admission  Medication Sig Dispense Refill Last Dose  . aspirin 81 MG tablet Take 1 tablet by mouth daily.   10/06/2016 at Unknown time  . ibuprofen (ADVIL,MOTRIN) 200 MG tablet Take 400 mg by mouth as needed for headache.   Past Week at Unknown time  . latanoprost (XALATAN)  0.005 % ophthalmic solution Place 1 drop into both eyes at bedtime.    10/05/2016 at Unknown time  . lisinopril-hydrochlorothiazide (PRINZIDE,ZESTORETIC) 10-12.5 MG tablet TAKE 1 TABLET DAILY 90 tablet 4 10/06/2016 at 08:06  . Multiple Vitamins-Minerals (MULTIVITAMIN ADULT PO) Take 1 tablet by mouth daily.   10/06/2016 at Unknown time  . Multiple Vitamins-Minerals (VISION VITAMINS PO) Take by mouth.   10/06/2016 at Unknown time  . nystatin cream (MYCOSTATIN) Apply 1 application topically 2 (two) times daily. (Patient taking differently: Apply 1 application topically as needed (rash). ) 15 g 0 Past Week at Unknown time  . triamcinolone ointment (KENALOG) 0.5 % Apply 1 application topically 2 (two) times daily. To rash on forearm 15 g 0 10/06/2016 at Unknown time    Results for orders placed or performed during the hospital encounter of 10/06/16 (from the past 48 hour(s))  Basic metabolic panel     Status: Abnormal   Collection Time: 10/06/16  5:55 PM  Result Value Ref Range   Sodium 139 135 - 145 mmol/L   Potassium 4.7 3.5 - 5.1 mmol/L   Chloride 105 101 - 111 mmol/L   CO2 25 22 - 32 mmol/L   Glucose, Bld 122 (H) 65 - 99 mg/dL   BUN 37 (H) 6 - 20 mg/dL   Creatinine, Ser 1.65 (H) 0.61 - 1.24 mg/dL   Calcium 9.3 8.9 - 10.3 mg/dL   GFR calc non Af  Amer 40 (L) >60 mL/min   GFR calc Af Amer 46 (L) >60 mL/min    Comment: (NOTE) The eGFR has been calculated using the CKD EPI equation. This calculation has not been validated in all clinical situations. eGFR's persistently <60 mL/min signify possible Chronic Kidney Disease.    Anion gap 9 5 - 15  CBC WITH DIFFERENTIAL     Status: Abnormal   Collection Time: 10/06/16  5:55 PM  Result Value Ref Range   WBC 9.9 4.0 - 10.5 K/uL   RBC 4.42 4.22 - 5.81 MIL/uL   Hemoglobin 13.6 13.0 - 17.0 g/dL   HCT 39.9 39.0 - 52.0 %   MCV 90.3 78.0 - 100.0 fL   MCH 30.8 26.0 - 34.0 pg   MCHC 34.1 30.0 - 36.0 g/dL   RDW 13.3 11.5 - 15.5 %   Platelets 129 (L) 150 - 400  K/uL   Neutrophils Relative % 82 %   Neutro Abs 8.1 (H) 1.7 - 7.7 K/uL   Lymphocytes Relative 12 %   Lymphs Abs 1.2 0.7 - 4.0 K/uL   Monocytes Relative 6 %   Monocytes Absolute 0.6 0.1 - 1.0 K/uL   Eosinophils Relative 0 %   Eosinophils Absolute 0.0 0.0 - 0.7 K/uL   Basophils Relative 0 %   Basophils Absolute 0.0 0.0 - 0.1 K/uL  Protime-INR     Status: None   Collection Time: 10/06/16  5:55 PM  Result Value Ref Range   Prothrombin Time 14.3 11.4 - 15.2 seconds   INR 1.10   Type and screen [place]     Status: None   Collection Time: 10/06/16  5:55 PM  Result Value Ref Range   ABO/RH(D) A POS    Antibody Screen NEG    Sample Expiration 10/09/2016   ABO/Rh     Status: None   Collection Time: 10/06/16  5:55 PM  Result Value Ref Range   ABO/RH(D) A POS   Protime-INR     Status: None   Collection Time: 10/06/16 10:59 PM  Result Value Ref Range   Prothrombin Time 14.6 11.4 - 15.2 seconds   INR 1.13   APTT     Status: None   Collection Time: 10/06/16 10:59 PM  Result Value Ref Range   aPTT 26 24 - 36 seconds  Basic metabolic panel     Status: Abnormal   Collection Time: 10/07/16  5:18 AM  Result Value Ref Range   Sodium 140 135 - 145 mmol/L   Potassium 3.9 3.5 - 5.1 mmol/L   Chloride 105 101 - 111 mmol/L   CO2 26 22 - 32 mmol/L   Glucose, Bld 117 (H) 65 - 99 mg/dL   BUN 33 (H) 6 - 20 mg/dL   Creatinine, Ser 1.25 (H) 0.61 - 1.24 mg/dL   Calcium 8.8 (L) 8.9 - 10.3 mg/dL   GFR calc non Af Amer 55 (L) >60 mL/min   GFR calc Af Amer >60 >60 mL/min    Comment: (NOTE) The eGFR has been calculated using the CKD EPI equation. This calculation has not been validated in all clinical situations. eGFR's persistently <60 mL/min signify possible Chronic Kidney Disease.    Anion gap 9 5 - 15    Dg Chest 1 View  Result Date: 10/06/2016 CLINICAL DATA:  Leg bathroom EXAM: CHEST 1 VIEW COMPARISON:  None. FINDINGS: No acute consolidation or effusion. Normal  cardiomediastinal silhouette with atherosclerosis. No pneumothorax. IMPRESSION: No active disease. Electronically Signed  By: Donavan Foil M.D.   On: 10/06/2016 18:44   Dg Tibia/fibula Right  Result Date: 10/06/2016 CLINICAL DATA:  Lower leg fracture EXAM: RIGHT TIBIA AND FIBULA - 2 VIEW COMPARISON:  None. FINDINGS: Comminuted fracture involving the distal shaft of the tibia with fracture lucencies extending to the metaphysis of the distal tibia. Approximately 1/2 shaft diameter of lateral displacement of distal fracture fragments and about 1 cm of separation of bony fragments. Additional comminuted fracture involving the proximal metaphysis and shaft of the fibula with a bowel 1/4 shaft diameter of anterior displacement of distal fracture fragment. Degenerative changes at the right knee. IMPRESSION: 1. Comminuted displaced and slightly separated distal tibial fracture 2. Comminuted and mildly displaced proximal fibular fracture Electronically Signed   By: Donavan Foil M.D.   On: 10/06/2016 18:46   Ct Tibia Fibula Right Wo Contrast  Result Date: 10/06/2016 CLINICAL DATA:  Patient fell at home today.  Right leg pain. EXAM: CT OF THE LOWER RIGHT EXTREMITY WITHOUT CONTRAST TECHNIQUE: Multidetector CT imaging of the right lower extremity was performed according to the standard protocol. COMPARISON:  Radiographs from earlier on the same day. FINDINGS: Bones/Joint/Cartilage 1. Acute, closed comminuted fibular neck and head fracture with slight anterior displacement of fibular shaft by 2 mm. 2. Acute, closed comminuted fracture at the junction of the middle and distal third of the tibial shaft with 6 mm of lateral and 4 mm of dorsal displacement of the main fracture fragment. 3. Coronally oriented posterior malleolar fracture without displacement. 4. Anterolateral distal tibial epiphyseal fracture extending into the ankle joint with negligible lateral displacement of the fracture fragment. No dislocation at the  knee or ankle. Subtalar and midfoot articulations to the extent visualized are intact. Ligaments Suboptimally assessed by CT. Muscles and Tendons Negative Soft tissues Soft tissue swelling and subcutaneous edema about the distal leg and ankle. IMPRESSION: 1. Acute, closed comminuted fibular neck and head fracture with slight anterior displacement of fibular shaft by 2 mm. 2. Acute, closed comminuted fracture at the junction of the middle and distal third of the tibial shaft with 6 mm of lateral and 4 mm of dorsal displacement of the main fracture fragment. 3. Coronally oriented posterior malleolar fracture without displacement. 4. Anterolateral distal tibial epiphyseal fracture extending into the ankle joint with negligible lateral displacement of the fracture fragment. 5. No dislocation at the knee or ankle. Subtalar and midfoot articulations to the extent visualized are intact. Electronically Signed   By: Ashley Royalty M.D.   On: 10/06/2016 21:43    ROS No recent fever, bleeding abnormalities, urologic dysfunction, GI problems, or weight gain. JHas lost 30 lbs over last few years.   Blood pressure 111/62, pulse 69, temperature 98.9 F (37.2 C), temperature source Oral, resp. rate 18, height _0  (1.905 m), weight 114.8 kg (253 lb), SpO2 99 %. Physical Exam NCAT RRR, no murmurs No wheezing S/NT/ND RLE Dressing intact, clean, dry  Edema/ swelling controlled  HUGE KNEE EFFUSION but no joint line tenderness  Sens: DPN, SPN, TN intact  Motor: EHL, FHL, and lessor toe ext and flex all intact grossly  Brisk cap refill, warm to touch No upper extremity tenderness.  Assessment/Plan: Right tibia shaft fracture, comminuted with extension into the joint, including anterolateral corner of tibia and posterior fleck off incisura suggesting possible syndesmotic injury 1. Aspiration of right knee, if blood then MRI 2. Ice, elevate today and tomorrow to control swelling 3. To OR on Friday for IMN and  percutaneous  screw fixation with syndesmotic evaluation under flouro and possible fixation   Altamese Neponset, MD [facility] [phone] (p)  10/07/2016  9:06 AM

## 2016-10-07 NOTE — Plan of Care (Signed)
Problem: Safety: Goal: Ability to remain free from injury will improve Call bell is in reach. Patient voices understanding about calling for help.

## 2016-10-07 NOTE — Progress Notes (Signed)
Orthopedic Tech Progress Note Patient Details:  Patrick Good 1940/12/20 [id number]  Ortho Devices Type of Ortho Device: Ace wrap, Post (short leg) splint, Stirrup splint Ortho Device/Splint Location: trapeze bar patient helper Ortho Device/Splint Interventions: Application   Patrick Good 10/07/2016, 8:12 AM

## 2016-10-07 NOTE — Procedures (Signed)
Procedure: Right knee aspiration and injection  Indication: Right knee effusion(s)  Surgeon: Silvestre Gunner, PA-C  Assist: None  Anesthesia: 107ml 2% Lidocaine  EBL: None  Complications: None  Findings: After risks/benefits explained patient desires to undergo procedure. Consent obtained and time out performed. Right knee was sterilely prepped and aspirated. 78ml of bloodly fluid obtained. Injected with 53ml mixture of Lidocaine and Marcaine. Pt tolerated the procedure well.    Lisette Abu, PA-C Orthopedic Surgery [phone]

## 2016-10-07 NOTE — Progress Notes (Addendum)
[facility] Consultation Progress Note  Patient: Patrick Good:[id number]   PCP: [name], MD DOB: Sep 13, 1940   DOA: 10/06/2016   DOS: 10/07/2016   Date of Service: the patient was seen and examined on 10/07/2016 Primary service: Netta Cedars, MD  Subjective: Patient mentions that his pain is not well controlled. Located on the right leg. Gets better with rest as well as pain medication. The effect of the medication does not last long enough and therefore he has intermittent pain occurring every 10-15 minutes. No nausea or vomiting. No chest pain or shortness of breath. No further dizziness lying down. Episodes of dizziness has been described as occurring ongoing at home, primarily occurring when he is changing his position. Never at rest. Denies any focal deficit associated with that. No nausea no vomiting no diarrhea at home. Patient has lost 30 pounds over last 6 month after diagnosed with prediabetes.  Brief hospital course: Pt. with PMH of hypertension, prediabetes, intentional weight loss; admitted on 10/06/2016, presented with complaint of dizziness and a fall, was found to have right comminuted tibial shaft fracture. Patient was admitted to orthopedic service, medicine was consulted for further workup of his dizziness and managing medical issues. Pain appears to be the main issue right now. Currently further plan is continue current management, surgery planned on Friday.  Assessment and Plan: 1. Dizziness and lightheadedness. No evidence of vertigo. This likely due to orthostasis. Patient has lost significant amount of weight over last 6 month and continues to take his antihypertensive medication which probably has led to orthostatic hypotension causing him to have the dizziness and fall. Telemetry Personally reviewed overnight unremarkable, EKG personally reviewed no evidence of significant arrhythmia or block. Echocardiogram has been ordered and we'll follow the  results. Patient had a stress test 2 year ago which was unremarkable. No focal deficit on examination to suggest any neurological was for this dizziness.  2. Essential hypertension. Patient mentions his blood pressure with his PCPs office has persistently been 120s and 70s. Taking lisinopril hydrochlorothiazide combination for last 5 years without any change in dose. Has lost a significant amount of weight which I think is the primary reason for patient's orthostasis. Currently I believe that the patient will not require any antihypertensive medication on discharge. Should the patient need antihypertensive medication would prefer to use medication that do not affect renal function. Continue to hold antihypertensive In the hospital and discontinue on discharge.  3. Acute kidney injury on chronic kidney disease stage II. Patient presented with serum creatinine 1.65 and elevated BUN. Baseline creatinine 1.2-1.4. Currently improved with holding his antihypertensive medication. Likely dehydration in the setting of continuous use of lisinopril-hydrochlorothiazide Recommended discontinue on discharge.  4. Prediabetes Hemoglobin A1c 5.9 in September 2017. Patient has lost a significant amount of weight. Daily CBG are well controlled. Check A1c tomorrow.  Diet: Cardiac and carb modified diet DVT Prophylaxis: subcutaneous Heparin  Advance goals of care discussion: Full code  Family Communication: family was present at bedside, at the time of interview. The pt provided permission to discuss medical plan with the family. Opportunity was given to ask question and all questions were answered satisfactorily.   Disposition: We will continue to follow the patient.   Recommendation on discharge: Discontinue antihypertensive medication. Follow-up with PCP in one week. Encourage fluid intake. Recheck BMP in 1 week.  Other Consultants: none Procedures: none Antibiotics: Anti-infectives    None       Objective: Physical Exam: Vitals:   10/06/16  2045 10/06/16 2100 10/06/16 2207 10/07/16 0344  BP: 139/63 129/74 137/71 111/62  Pulse: 89 84 83 69  Resp: 16 15 18 18   Temp:   99.5 F (37.5 C) 98.9 F (37.2 C)  TempSrc:   Oral Oral  SpO2: 99% 99% 97% 99%  Weight:      Height:        Intake/Output Summary (Last 24 hours) at 10/07/16 1022 Last data filed at 10/06/16 2010  Gross per 24 hour  Intake              550 ml  Output                0 ml  Net              550 ml   Filed Weights   10/06/16 1724  Weight: 114.8 kg (253 lb)   General: Alert, Awake and Oriented to Time, Place and Person. Appear in moderate distress, affect appropriate Eyes: PERRL, Conjunctiva normal ENT: Oral Mucosa clear moist. Neck: no JVD, no Abnormal Mass Or lumps Cardiovascular: S1 and S2 Present, no Murmur, Peripheral Pulses Present Respiratory: normal respiratory effort, Bilateral Air entry equal and Decreased, no use of accessory muscle, Clear to Auscultation, no Crackles, no wheezes Abdomen: Bowel Sound present, Soft and no tenderness, no hernia Skin: no redness, no Rash, no induration Extremities: no Pedal edema, no calf tenderness Neurologic: Grossly no focal neuro deficit. Bilaterally Equal motor strength Cerebellar test normal finger nose finger. Gait not checked due to patient safety concerns.     Data Reviewed: CBC:  Recent Labs Lab 10/06/16 1755  WBC 9.9  NEUTROABS 8.1*  HGB 13.6  HCT 39.9  MCV 90.3  PLT 157*   Basic Metabolic Panel:  Recent Labs Lab 10/06/16 1755 10/07/16 0518  NA 139 140  K 4.7 3.9  CL 105 105  CO2 25 26  GLUCOSE 122* 117*  BUN 37* 33*  CREATININE 1.65* 1.25*  CALCIUM 9.3 8.8*   Liver Function Tests: No results for input(s): AST, ALT, ALKPHOS, BILITOT, PROT, ALBUMIN in the last 168 hours. No results for input(s): LIPASE, AMYLASE in the last 168 hours. No results for input(s): AMMONIA in the last 168 hours. Cardiac Enzymes: No results for  input(s): CKTOTAL, CKMB, CKMBINDEX, TROPONINI in the last 168 hours. BNP (last 3 results) No results for input(s): BNP in the last 8760 hours. CBG: No results for input(s): GLUCAP in the last 168 hours. Recent Results (from the past 240 hour(s))  Surgical pcr screen     Status: Abnormal   Collection Time: 10/07/16 12:29 AM  Result Value Ref Range Status   MRSA, PCR INVALID RESULTS, SPECIMEN SENT FOR CULTURE (A) NEGATIVE Final   Staphylococcus aureus INVALID RESULTS, SPECIMEN SENT FOR CULTURE (A) NEGATIVE Final    Comment: Results Called to: Gonzella Lex RN, AT [phone] 10/07/16 BY C. JESSUP     Studies: Dg Chest 1 View  Result Date: 10/06/2016 CLINICAL DATA:  Leg bathroom EXAM: CHEST 1 VIEW COMPARISON:  None. FINDINGS: No acute consolidation or effusion. Normal cardiomediastinal silhouette with atherosclerosis. No pneumothorax. IMPRESSION: No active disease. Electronically Signed   By: Donavan Foil M.D.   On: 10/06/2016 18:44   Dg Tibia/fibula Right  Result Date: 10/06/2016 CLINICAL DATA:  Lower leg fracture EXAM: RIGHT TIBIA AND FIBULA - 2 VIEW COMPARISON:  None. FINDINGS: Comminuted fracture involving the distal shaft of the tibia with fracture lucencies extending to the metaphysis of the distal tibia. Approximately 1/2  shaft diameter of lateral displacement of distal fracture fragments and about 1 cm of separation of bony fragments. Additional comminuted fracture involving the proximal metaphysis and shaft of the fibula with a bowel 1/4 shaft diameter of anterior displacement of distal fracture fragment. Degenerative changes at the right knee. IMPRESSION: 1. Comminuted displaced and slightly separated distal tibial fracture 2. Comminuted and mildly displaced proximal fibular fracture Electronically Signed   By: Donavan Foil M.D.   On: 10/06/2016 18:46   Ct Tibia Fibula Right Wo Contrast  Result Date: 10/06/2016 CLINICAL DATA:  Patient fell at home today.  Right leg pain. EXAM: CT OF THE LOWER  RIGHT EXTREMITY WITHOUT CONTRAST TECHNIQUE: Multidetector CT imaging of the right lower extremity was performed according to the standard protocol. COMPARISON:  Radiographs from earlier on the same day. FINDINGS: Bones/Joint/Cartilage 1. Acute, closed comminuted fibular neck and head fracture with slight anterior displacement of fibular shaft by 2 mm. 2. Acute, closed comminuted fracture at the junction of the middle and distal third of the tibial shaft with 6 mm of lateral and 4 mm of dorsal displacement of the main fracture fragment. 3. Coronally oriented posterior malleolar fracture without displacement. 4. Anterolateral distal tibial epiphyseal fracture extending into the ankle joint with negligible lateral displacement of the fracture fragment. No dislocation at the knee or ankle. Subtalar and midfoot articulations to the extent visualized are intact. Ligaments Suboptimally assessed by CT. Muscles and Tendons Negative Soft tissues Soft tissue swelling and subcutaneous edema about the distal leg and ankle. IMPRESSION: 1. Acute, closed comminuted fibular neck and head fracture with slight anterior displacement of fibular shaft by 2 mm. 2. Acute, closed comminuted fracture at the junction of the middle and distal third of the tibial shaft with 6 mm of lateral and 4 mm of dorsal displacement of the main fracture fragment. 3. Coronally oriented posterior malleolar fracture without displacement. 4. Anterolateral distal tibial epiphyseal fracture extending into the ankle joint with negligible lateral displacement of the fracture fragment. 5. No dislocation at the knee or ankle. Subtalar and midfoot articulations to the extent visualized are intact. Electronically Signed   By: Ashley Royalty M.D.   On: 10/06/2016 21:43     Scheduled Meds: . bupivacaine  10 mL Infiltration Once  . docusate sodium  100 mg Oral BID  . enoxaparin (LOVENOX) injection  40 mg Subcutaneous Q24H  . latanoprost  1 drop Both Eyes QHS  .  lidocaine  5 mL Infiltration Once  . multivitamin with minerals  1 tablet Oral Daily   Continuous Infusions: . sodium chloride 75 mL/hr at 10/06/16 2115  . methocarbamol (ROBAXIN)  IV     PRN Meds: HYDROmorphone (DILAUDID) injection, methocarbamol **OR** methocarbamol (ROBAXIN)  IV, oxyCODONE, oxyCODONE-acetaminophen, polyethylene glycol  Time spent: 35 minutes  Author: Berle Mull, MD [facility] Pager: [phone] 10/07/2016 10:22 AM  If 7PM-7AM, please contact night-coverage at [email or web address], password [facility]

## 2016-10-08 ENCOUNTER — Inpatient Hospital Stay (HOSPITAL_COMMUNITY): Payer: Medicare Other

## 2016-10-08 ENCOUNTER — Encounter (HOSPITAL_COMMUNITY): Payer: Self-pay | Admitting: Orthopedic Surgery

## 2016-10-08 DIAGNOSIS — I10 Essential (primary) hypertension: Secondary | ICD-10-CM

## 2016-10-08 DIAGNOSIS — S82251A Displaced comminuted fracture of shaft of right tibia, initial encounter for closed fracture: Principal | ICD-10-CM

## 2016-10-08 DIAGNOSIS — S82201A Unspecified fracture of shaft of right tibia, initial encounter for closed fracture: Secondary | ICD-10-CM

## 2016-10-08 HISTORY — DX: Unspecified fracture of shaft of right tibia, initial encounter for closed fracture: S82.201A

## 2016-10-08 LAB — RENAL FUNCTION PANEL
ALBUMIN: 3.4 g/dL — AB (ref 3.5–5.0)
Anion gap: 8 (ref 5–15)
BUN: 18 mg/dL (ref 6–20)
CHLORIDE: 102 mmol/L (ref 101–111)
CO2: 27 mmol/L (ref 22–32)
CREATININE: 1.06 mg/dL (ref 0.61–1.24)
Calcium: 8.6 mg/dL — ABNORMAL LOW (ref 8.9–10.3)
GFR calc Af Amer: >60 mL/min (ref >60)
GLUCOSE: 114 mg/dL — AB (ref 65–99)
Phosphorus: 2.6 mg/dL (ref 2.5–4.6)
Potassium: 4.3 mmol/L (ref 3.5–5.1)
Sodium: 137 mmol/L (ref 135–145)

## 2016-10-08 LAB — GLUCOSE, CAPILLARY
GLUCOSE-CAPILLARY: 112 mg/dL — AB (ref 65–99)
GLUCOSE-CAPILLARY: 89 mg/dL (ref 65–99)
GLUCOSE-CAPILLARY: 114 mg/dL — AB (ref 65–99)
Glucose-Capillary: 97 mg/dL (ref 65–99)

## 2016-10-08 LAB — MRSA CULTURE: Culture: NOT DETECTED

## 2016-10-08 LAB — ECHOCARDIOGRAM COMPLETE
AVLVOTPG: 7 mmHg
CHL CUP DOP CALC LVOT VTI: 28.6 cm
CHL CUP MV DEC (S): 278
E/e' ratio: 9.61
EWDT: 278 ms
FS: 34 % (ref 28–44)
HEIGHTINCHES: 75 in
IVS/LV PW RATIO, ED: 1.09
LA diam index: 1.65 cm/m2
LA vol A4C: 67 ml
LASIZE: 41 mm
LAVOL: 57.5 mL
LAVOLIN: 23.1 mL/m2
LDCA: 3.46 cm2
LEFT ATRIUM END SYS DIAM: 41 mm
LV E/e' medial: 9.61
LV PW d: 11 mm — AB (ref 0.6–1.1)
LV e' LATERAL: 10.3 cm/s
LVEEAVG: 9.61
LVOT SV: 99 mL
LVOT peak vel: 135 cm/s
LVOTD: 21 mm
Lateral S' vel: 13.4 cm/s
MV Peak grad: 4 mmHg
MV pk E vel: 99 m/s
MVAP: 2.72 cm2
MVPKAVEL: 108 m/s
P 1/2 time: 81 ms
TAPSE: 30.4 mm
TDI e' lateral: 10.3
TDI e' medial: 9.79
WEIGHTICAEL: 4048 [oz_av]

## 2016-10-08 MED ORDER — CEFAZOLIN SODIUM-DEXTROSE 2-4 GM/100ML-% IV SOLN
2.0000 g | INTRAVENOUS | Status: AC
  Administered 2016-10-09: 2 g via INTRAVENOUS
  Filled 2016-10-08: qty 100

## 2016-10-08 MED ORDER — PERFLUTREN LIPID MICROSPHERE
1.0000 mL | INTRAVENOUS | Status: DC | PRN
  Filled 2016-10-08: qty 10

## 2016-10-08 MED ORDER — PERFLUTREN LIPID MICROSPHERE
1.0000 mL | INTRAVENOUS | Status: AC | PRN
  Administered 2016-10-08: 2.5 mL via INTRAVENOUS
  Filled 2016-10-08: qty 10

## 2016-10-08 NOTE — Plan of Care (Signed)
Problem: Pain Managment: Goal: General experience of comfort will improve Patient's pain is under control per patient. Patient verbalizes understanding of the pain scale.

## 2016-10-08 NOTE — Consult Note (Signed)
Physical Medicine and Rehabilitation Consult   Reason for Consult: right tibial shaft fracture and knee effusion.  Referring Physician: Dr. Marcelino Scot   HPI: Patrick Good is a 76 y.o. male with history of HTN, prediabetes who sustained a fall due to episode of dizziness followed by brief syncopal episode while mowing his lawn. History taken from chart review, patient, and wife. 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 He felt a snap of his ankle and sustained complex right tibial fracture with intraarticular extension. Hospitalist following for management of dehydration due to acute on chronic renal insufficiency and issues with hypotension. Syncope felt to be due to orthostasis. Dr. Marcelino Scot consulted and right knee effusion aspirated with plans for surgery. IM nail in right tibia with ORIF of ankle performed 10/09/16.  Hospital course complicated by pain, constipation.   Review of Systems  Constitutional: Negative for chills and fever.  Eyes: Negative for blurred vision and double vision.  Musculoskeletal: Positive for falls and joint pain.  Neurological: Positive for weakness. Negative for sensory change and speech change.  All other systems reviewed and are negative.     Past Medical History:  Diagnosis Date  . History of ankle fracture 1982   spiral break; left ankle  . History of chicken pox   . History of measles   . Hypertension     Past Surgical History:  Procedure Laterality Date  . HERNIA REPAIR  1946 and 1952  . KNEE SURGERY  12/2011   Left meniscus by Dr. Wylene Simmer in [place]  . Myocardial Perfusion Scan  07/29/2011   Normal  . VASECTOMY  1972    Family History  Problem Relation Age of Onset  . Heart Problems Sister   . Diabetes type II Sister   . Breast cancer Sister   . Multiple sclerosis Sister      Social History:  reports that he quit smoking about 24 years ago. His smoking use included  Cigarettes. He has a 12.50 pack-year smoking history. He has never used smokeless tobacco. He reports that he does not drink alcohol or use drugs.    Allergies: No Known Allergies    Medications Prior to Admission  Medication Sig Dispense Refill  . aspirin 81 MG tablet Take 1 tablet by mouth daily.    Marland Kitchen ibuprofen (ADVIL,MOTRIN) 200 MG tablet Take 400 mg by mouth as needed for headache.    . latanoprost (XALATAN) 0.005 % ophthalmic solution Place 1 drop into both eyes at bedtime.     Marland Kitchen lisinopril-hydrochlorothiazide (PRINZIDE,ZESTORETIC) 10-12.5 MG tablet TAKE 1 TABLET DAILY 90 tablet 4  . Multiple Vitamins-Minerals (MULTIVITAMIN ADULT PO) Take 1 tablet by mouth daily.    . Multiple Vitamins-Minerals (VISION VITAMINS PO) Take by mouth.    . nystatin cream (MYCOSTATIN) Apply 1 application topically 2 (two) times daily. (Patient taking differently: Apply 1 application topically as needed (rash). ) 15 g 0  . triamcinolone ointment (KENALOG) 0.5 % Apply 1 application topically 2 (two) times daily. To rash on forearm 15 g 0    Home: Home Living Family/patient expects to be discharged to:: Private residence Living Arrangements: Spouse/significant other Available Help at Discharge: Family, Available 24 hours/day (wife can't help) Type of Home: House Home Access: Stairs to enter [employer] of Steps: 2 (6") Entrance Stairs-Rails: None Home Layout: Two level, Able to live on main level with bedroom/bathroom Bathroom Shower/Tub: Multimedia programmer:  Handicapped height Home Equipment: Cane - single point, Crutches  Functional History: Prior Function Level of Independence: Independent Functional Status:  Mobility: Bed Mobility Overal bed mobility: Needs Assistance Bed Mobility: Supine to Sit Supine to sit: HOB elevated, Min assist General bed mobility comments: for R LE Transfers Overall transfer level: Needs assistance Equipment used: Rolling walker (2  wheeled) Transfers: Sit to/from Stand, [employer] Transfers Sit to Stand: Min assist Stand pivot transfers: Min assist General transfer comment: cues for hand placement, assist for balance, able to maintain NWB in standing (not in sitting); assist for balance to pivot on L foot to recliner with RW Ambulation/Gait General Gait Details: NT due to pain, anxiety    ADL:    Cognition: Cognition Overall Cognitive Status: Within Functional Limits for tasks assessed Cognition Arousal/Alertness: Awake/alert Behavior During Therapy: WFL for tasks assessed/performed Overall Cognitive Status: Within Functional Limits for tasks assessed  Blood pressure 120/66, pulse 72, temperature 98.8 F (37.1 C), temperature source Oral, resp. rate 18, height 6\' 3"  (1.905 m), weight 114.8 kg (253 lb), SpO2 99 %. Physical Exam  Vitals reviewed. Constitutional: He is oriented to person, place, and time. He appears well-developed and well-nourished.  HENT:  Head: Normocephalic and atraumatic.  Eyes: EOM are normal. Right eye exhibits no discharge. Left eye exhibits no discharge.  Neck: Normal range of motion. Neck supple.  Cardiovascular: Normal rate and regular rhythm.   Respiratory: Effort normal and breath sounds normal.  GI: Soft. Bowel sounds are normal.  Musculoskeletal: He exhibits edema and tenderness.  Neurological: He is alert and oriented to person, place, and time.  Motor: B/l UE 5/5 proximal to distal LLE: 4+/5 proximal to distal RLE: HF 2/5, knee and ankle wrapped, wiggles toes Sensation intact to light touch  Skin: Skin is warm and dry.  RLE dressed  Psychiatric: He has a normal mood and affect. His behavior is normal. Thought content normal.    Results for orders placed or performed during the hospital encounter of 10/06/16 (from the past 24 hour(s))  Glucose, capillary     Status: None   Collection Time: 10/07/16  5:17 PM  Result Value Ref Range   Glucose-Capillary 94 65 - 99 mg/dL    Comment 1 Notify RN    Comment 2 Document in Chart   Glucose, capillary     Status: Abnormal   Collection Time: 10/07/16  9:35 PM  Result Value Ref Range   Glucose-Capillary 121 (H) 65 - 99 mg/dL  Renal function panel     Status: Abnormal   Collection Time: 10/08/16  5:25 AM  Result Value Ref Range   Sodium 137 135 - 145 mmol/L   Potassium 4.3 3.5 - 5.1 mmol/L   Chloride 102 101 - 111 mmol/L   CO2 27 22 - 32 mmol/L   Glucose, Bld 114 (H) 65 - 99 mg/dL   BUN 18 6 - 20 mg/dL   Creatinine, Ser 1.06 0.61 - 1.24 mg/dL   Calcium 8.6 (L) 8.9 - 10.3 mg/dL   Phosphorus 2.6 2.5 - 4.6 mg/dL   Albumin 3.4 (L) 3.5 - 5.0 g/dL   GFR calc non Af Amer >60 >60 mL/min   GFR calc Af Amer >60 >60 mL/min   Anion gap 8 5 - 15  Glucose, capillary     Status: Abnormal   Collection Time: 10/08/16  6:18 AM  Result Value Ref Range   Glucose-Capillary 112 (H) 65 - 99 mg/dL   Dg Chest 1 View  Result  Date: 10/06/2016 CLINICAL DATA:  Leg bathroom EXAM: CHEST 1 VIEW COMPARISON:  None. FINDINGS: No acute consolidation or effusion. Normal cardiomediastinal silhouette with atherosclerosis. No pneumothorax. IMPRESSION: No active disease. Electronically Signed   By: Donavan Foil M.D.   On: 10/06/2016 18:44   Dg Tibia/fibula Right  Result Date: 10/06/2016 CLINICAL DATA:  Lower leg fracture EXAM: RIGHT TIBIA AND FIBULA - 2 VIEW COMPARISON:  None. FINDINGS: Comminuted fracture involving the distal shaft of the tibia with fracture lucencies extending to the metaphysis of the distal tibia. Approximately 1/2 shaft diameter of lateral displacement of distal fracture fragments and about 1 cm of separation of bony fragments. Additional comminuted fracture involving the proximal metaphysis and shaft of the fibula with a bowel 1/4 shaft diameter of anterior displacement of distal fracture fragment. Degenerative changes at the right knee. IMPRESSION: 1. Comminuted displaced and slightly separated distal tibial fracture 2.  Comminuted and mildly displaced proximal fibular fracture Electronically Signed   By: Donavan Foil M.D.   On: 10/06/2016 18:46   Ct Tibia Fibula Right Wo Contrast  Result Date: 10/06/2016 CLINICAL DATA:  Patient fell at home today.  Right leg pain. EXAM: CT OF THE LOWER RIGHT EXTREMITY WITHOUT CONTRAST TECHNIQUE: Multidetector CT imaging of the right lower extremity was performed according to the standard protocol. COMPARISON:  Radiographs from earlier on the same day. FINDINGS: Bones/Joint/Cartilage 1. Acute, closed comminuted fibular neck and head fracture with slight anterior displacement of fibular shaft by 2 mm. 2. Acute, closed comminuted fracture at the junction of the middle and distal third of the tibial shaft with 6 mm of lateral and 4 mm of dorsal displacement of the main fracture fragment. 3. Coronally oriented posterior malleolar fracture without displacement. 4. Anterolateral distal tibial epiphyseal fracture extending into the ankle joint with negligible lateral displacement of the fracture fragment. No dislocation at the knee or ankle. Subtalar and midfoot articulations to the extent visualized are intact. Ligaments Suboptimally assessed by CT. Muscles and Tendons Negative Soft tissues Soft tissue swelling and subcutaneous edema about the distal leg and ankle. IMPRESSION: 1. Acute, closed comminuted fibular neck and head fracture with slight anterior displacement of fibular shaft by 2 mm. 2. Acute, closed comminuted fracture at the junction of the middle and distal third of the tibial shaft with 6 mm of lateral and 4 mm of dorsal displacement of the main fracture fragment. 3. Coronally oriented posterior malleolar fracture without displacement. 4. Anterolateral distal tibial epiphyseal fracture extending into the ankle joint with negligible lateral displacement of the fracture fragment. 5. No dislocation at the knee or ankle. Subtalar and midfoot articulations to the extent visualized are intact.  Electronically Signed   By: Ashley Royalty M.D.   On: 10/06/2016 21:43   Mr Knee Right Wo Contrast  Result Date: 10/08/2016 CLINICAL DATA:  Status post fall. Right leg injury. Right knee hemarthrosis. EXAM: MRI OF THE RIGHT KNEE WITHOUT CONTRAST TECHNIQUE: Multiplanar, multisequence MR imaging of the knee was performed. No intravenous contrast was administered. COMPARISON:  None. FINDINGS: MENISCI Medial meniscus: Small radial tear of the free edge of the body of the medial meniscus. Lateral meniscus: Radial tear of the free edge of the posterior horn of the lateral meniscus. Horizontal tear of the body of the lateral meniscus extending to the free edge. LIGAMENTS Cruciates:  Intact ACL and PCL. Collaterals: Medial collateral ligament is intact. Fibular collateral ligament is attenuated distally concerning for a partial tear. Iliotibial band and biceps femoris tendon are intact. CARTILAGE  Patellofemoral:  No chondral defect. Medial: Partial-thickness cartilage loss of medial femorotibial compartment. Lateral: Partial-thickness cartilage loss of the lateral femorotibial compartment. Joint: Large hemarthrosis. Normal Hoffa's fat. No plical thickening. Popliteal Fossa:  No Baker cyst.  Intact popliteus tendon. Extensor Mechanism: Intact quadriceps tendon and patellar tendon. Intact medial and lateral patellar retinaculum. Intact MPFL. Bones: Comminuted fracture of the fibular head. No other marrow signal abnormality. No other fracture or dislocation. Other: Severe edema in the proximal soleus muscle and tibia [place] anterior muscle likely reflecting muscle strain. IMPRESSION: 1. Large hemarthrosis. 2. Comminuted fracture of the fibular head. 3. Small radial tear of the free edge of the body of the medial meniscus. 4. Radial tear of the free edge of the posterior horn of the lateral meniscus. Horizontal tear of the body of the lateral meniscus extending to the free edge. 5. Fibular collateral ligament is attenuated distally  concerning for a partial tear. Iliotibial band and biceps femoris tendon are intact. Electronically Signed   By: Kathreen Devoid   On: 10/08/2016 08:14    Assessment/Plan: Diagnosis: right tibial shaft fracture and knee effusion Labs independently reviewed.  Records reviewed and summated above.  1. Does the need for close, 24 hr/day medical supervision in concert with the patient's rehab needs make it unreasonable for this patient to be served in a less intensive setting? Yes 2. Co-Morbidities requiring supervision/potential complications:  Post-op pain (Biofeedback training with therapies to help reduce reliance on opiate pain medications, particularly IV dilaudid, monitor pain control during therapies, and sedation at rest and titrate to maximum efficacy to ensure participation and gains in therapies), constipation (adjust bowel reg as necessary), orthostasis (ensure appropriate fluid intake, may consider abd binder), hypotension (see previous), acute on chronic renal failure (avoid nephrotoxic meds), syncope (monitor), HTN (monitor and provide prns in accordance with increased physical exertion and pain), prediabetes  (Monitor in accordance with exercise and adjust meds as necessary), ABLA (transfuse if necessary to ensure appropriate perfusion for increased activity tolerance) 3. Due to bladder management, bowel management, safety, skin/wound care, disease management, pain management and patient education, does the patient require 24 hr/day rehab nursing? Yes 4. Does the patient require coordinated care of a physician, rehab nurse, PT (1-2 hrs/day, 5 days/week) and OT (1-2 hrs/day, 5 days/week) to address physical and functional deficits in the context of the above medical diagnosis(es)? Yes Addressing deficits in the following areas: balance, endurance, locomotion, strength, transferring, bowel/bladder control, bathing, dressing, toileting and psychosocial support 5. Can the patient actively  participate in an intensive therapy program of at least 3 hrs of therapy per day at least 5 days per week? Yes 6. The potential for patient to make measurable gains while on inpatient rehab is excellent 7. Anticipated functional outcomes upon discharge from inpatient rehab are supervision  with PT, supervision with OT, n/a with SLP. 8. Estimated rehab length of stay to reach the above functional goals is: 8-12 days. 9. Anticipated D/C setting: Home 10. Anticipated post D/C treatments: HH therapy and Home excercise program 11. Overall Rehab/Functional Prognosis: excellent  RECOMMENDATIONS: This patient's condition is appropriate for continued rehabilitative care in the following setting: Will need post-op therapy evals.  Likely CIR when patient able to tolerate 3 hours of therapy/day. Patient has agreed to participate in recommended program. Yes Note that insurance prior authorization may be required for reimbursement for recommended care.  Comment: Rehab Admissions Coordinator to follow up.  Delice Lesch, MD, Mellody Drown 10/10/16 Bary Leriche, PA-C 10/08/2016

## 2016-10-08 NOTE — Progress Notes (Signed)
  Echocardiogram 2D Echocardiogram has been performed.  Elantra Caprara G Geovanie Winnett 10/08/2016, 1:51 PM

## 2016-10-08 NOTE — Progress Notes (Signed)
Rehab Admissions Coordinator Note:  Patient was screened by Retta Diones for appropriateness for an Inpatient Acute Rehab Consult.  Noted PT recommending inpatient rehab consult. Noted surgery is pending.  May want to consider inpatient rehab consult post operatively.  Jodell Cipro M 10/08/2016, 11:30 AM  I can be reached at [phone].

## 2016-10-08 NOTE — Evaluation (Signed)
Physical Therapy Evaluation Patient Details Name: Patrick Good MRN: [id number] DOB: 1941-04-20 Today's Date: 10/08/2016   History of Present Illness  Patrick Good is an 76 y.o. male with past medical hx of HTN, pre-diabetes and has been working on intentional weight loss and has lost about 30 pounds with diet, glaucoma, who presents to the ED with chief complaint of syncopal episode resulting in right leg injury.  Found to have R tib/fib fracture, awaiting surgical fixation.  PMH L ankle fx 1980's, meniscal repair, HTN, hernia repair.   Clinical Impression  Patient presents with decreased independence with mobility due to R LE pain, inability to lift on his own, decreased balance, decreased activity tolerance and limited assist available at home.  Currently requires min A for OOB to chair.  Important to maintain foot elevation today to help swelling to allow for surgical fixation.  Feel patient may benefit from CIR rehab prior to d/c home as his wife is unable to provide help to him as he usually helps her at home.  Will follow acutely.    Follow Up Recommendations CIR    Equipment Recommendations  Rolling walker with 5" wheels;3in1 (PT)    Recommendations for Other Services       Precautions / Restrictions Precautions Precautions: Fall Required Braces or Orthoses: Other Brace/Splint Other Brace/Splint: R ankle splint Restrictions Weight Bearing Restrictions: Yes RLE Weight Bearing: Non weight bearing      Mobility  Bed Mobility Overal bed mobility: Needs Assistance Bed Mobility: Supine to Sit     Supine to sit: HOB elevated;Min assist     General bed mobility comments: for R LE  Transfers Overall transfer level: Needs assistance Equipment used: Rolling walker (2 wheeled) Transfers: Sit to/from [employer] Sit to Stand: Min assist Stand pivot transfers: Min assist       General transfer comment: cues for hand placement, assist for balance, able  to maintain NWB in standing (not in sitting); assist for balance to pivot on L foot to recliner with RW  Ambulation/Gait             General Gait Details: NT due to pain, anxiety  Stairs            Wheelchair Mobility    Modified Rankin (Stroke Patients Only)       Balance Overall balance assessment: Needs assistance   Sitting balance-Leahy Scale: Good     Standing balance support: Bilateral upper extremity supported Standing balance-Leahy Scale: Poor Standing balance comment: UE support due to NWB R LE, min A for safety                              Pertinent Vitals/Pain Pain Assessment: 0-10 Pain Score: 3  Pain Location: R shin Pain Descriptors / Indicators: Aching;Dull Pain Intervention(s): Monitored during session;Repositioned    Home Living Family/patient expects to be discharged to:: Private residence Living Arrangements: Spouse/significant other Available Help at Discharge: Family;Available 24 hours/day (wife can't help) Type of Home: House Home Access: Stairs to enter Entrance Stairs-Rails: None Entrance Stairs-Number of Steps: 2 (6") Home Layout: Two level;Able to live on main level with bedroom/bathroom Home Equipment: Kasandra Knudsen - single point;Crutches      Prior Function Level of Independence: Independent               Hand Dominance   Dominant Hand: Right    Extremity/Trunk Assessment   Upper Extremity Assessment Upper Extremity  Assessment: Overall WFL for tasks assessed    Lower Extremity Assessment Lower Extremity Assessment: RLE deficits/detail RLE Deficits / Details: heavy splint and ace wrap to leg, unable to lift unaided, able to wiggle toes, severe pain at times when elevating leg, reports meniscal injury as well in knee, able to perform quad set, ROM NT due to ankle pian       Communication   Communication: No difficulties  Cognition Arousal/Alertness: Awake/alert Behavior During Therapy: WFL for tasks  assessed/performed Overall Cognitive Status: Within Functional Limits for tasks assessed                                        General Comments General comments (skin integrity, edema, etc.): Educated to perform toe wiggles each hour, and did quad sets 3 x 5 sec hold on R    Exercises     Assessment/Plan    PT Assessment Patient needs continued PT services  PT Problem List Decreased mobility;Decreased knowledge of precautions;Decreased knowledge of use of DME;Pain;Decreased balance;Decreased activity tolerance;Decreased range of motion       PT Treatment Interventions DME instruction;Gait training;Therapeutic exercise;Stair training;Functional mobility training;Balance training;Therapeutic activities;Patient/family education    PT Goals (Current goals can be found in the Care Plan section)  Acute Rehab PT Goals Patient Stated Goal: To return to independent PT Goal Formulation: With patient/family Time For Goal Achievement: 10/15/16 Potential to Achieve Goals: Good    Frequency Min 5X/week   Barriers to discharge        Co-evaluation               AM-PAC PT "6 Clicks" Daily Activity  Outcome Measure Difficulty turning over in bed (including adjusting bedclothes, sheets and blankets)?: A Little Difficulty moving from lying on back to sitting on the side of the bed? : Total Difficulty sitting down on and standing up from a chair with arms (e.g., wheelchair, bedside commode, etc,.)?: Total Help needed moving to and from a bed to chair (including a wheelchair)?: A Little Help needed walking in hospital room?: A Little Help needed climbing 3-5 steps with a railing? : A Lot 6 Click Score: 13    End of Session Equipment Utilized During Treatment: Gait belt   Patient left: with call bell/phone within reach;in chair;with family/visitor present Nurse Communication: Mobility status;Patient requests pain meds PT Visit Diagnosis: Difficulty in walking, not  elsewhere classified (R26.2);Pain Pain - Right/Left: Right Pain - part of body: Ankle and joints of foot    Time: 7628-3151 PT Time Calculation (min) (ACUTE ONLY): 37 min   Charges:   PT Evaluation $PT Eval Moderate Complexity: 1 Procedure PT Treatments $Therapeutic Activity: 8-22 mins   PT G CodesMagda Kiel, [place] [phone] 10/08/2016   Reginia Naas 10/08/2016, 11:00 AM

## 2016-10-08 NOTE — Progress Notes (Signed)
OT Cancellation Note  Patient Details Name: Patrick Good MRN: [id number] DOB: 06/01/1940   Cancelled Treatment:    Reason Eval/Treat Not Completed: Patient at procedure or test/ unavailable. Will reattempt OT eval as schedule permits.   Tyrone Schimke OTR/L Pager: [phone]   10/08/2016, 11:45 AM

## 2016-10-08 NOTE — Progress Notes (Signed)
[facility] Consultation Progress Note  Patient: Patrick Good QQI:[id number]   PCP: [name], MD DOB: 1940/08/05   DOA: 10/06/2016   DOS: 10/08/2016   Date of Service: the patient was seen and examined on 10/08/2016 Primary service: [name], MD  Subjective: No acute complaint, pain is well-controlled  Brief hospital course: Pt. with PMH of hypertension, prediabetes, intentional weight loss; admitted on 10/06/2016, presented with complaint of dizziness and a fall, was found to have right comminuted tibial shaft fracture. Patient was admitted to orthopedic service, medicine was consulted for further workup of his dizziness and managing medical issues. Pain appears to be the main issue right now. Episodes of dizziness has been described as occurring ongoing at home, primarily occurring when he is changing his position. Never at rest. Denies any focal deficit associated with that. No nausea no vomiting no diarrhea at home. Patient has lost 30 pounds over last 6 month after diagnosed with prediabetes. Currently further plan is continue current management, surgery planned on Friday.  Assessment and Plan: 1. Dizziness and lightheadedness. No evidence of vertigo. This likely due to orthostasis. Patient has lost significant amount of weight over last 6 month and continues to take his antihypertensive medication which probably has led to orthostatic hypotension causing him to have the dizziness and fall. Telemetry Personally reviewed overnight unremarkable, EKG personally reviewed no evidence of significant arrhythmia or block. Echocardiogram shows no acute abnormality. Ejection fraction normal as well. Diastolic dysfunction. No wall motion abnormality. No further workup at present. Patient had a stress test 2 year ago which was unremarkable. No focal deficit on examination to suggest any neurological was for this dizziness.  2. Essential hypertension. Patient mentions his blood  pressure with his PCPs office has persistently been 120s and 70s. Taking lisinopril hydrochlorothiazide combination for last 5 years without any change in dose. Has lost a significant amount of weight which I think is the primary reason for patient's orthostasis. Currently I believe that the patient will not require any antihypertensive medication on discharge. Should the patient need antihypertensive medication would prefer to use medication that do not affect renal function. Continue to hold antihypertensive In the hospital and discontinue on discharge.  3. Acute kidney injury on chronic kidney disease stage II. Patient presented with serum creatinine 1.65 and elevated BUN. Baseline creatinine 1.2-1.4. Currently improved with holding his antihypertensive medication. Likely dehydration in the setting of continuous use of lisinopril-hydrochlorothiazide Recommended discontinue on discharge.  4. Prediabetes Hemoglobin A1c 5.9 in September 2017. Patient has lost a significant amount of weight. Daily CBG are well controlled. Check A1c.  Diet: Cardiac and carb modified diet-patient refused and wants to be on regular diet. We will continue with sliding scale. DVT Prophylaxis: subcutaneous Heparin  Advance goals of care discussion: Full code  Family Communication: family was present at bedside, at the time of interview. The pt provided permission to discuss medical plan with the family. Opportunity was given to ask question and all questions were answered satisfactorily.   Disposition: We will sign off. Please call us with questions.  Recommendation on discharge: Discontinue antihypertensive medication. Follow-up with PCP in one week. Encourage fluid intake. Recheck BMP in 1 week.  Other Consultants: none Procedures: none Antibiotics: Anti-infectives    Start     Dose/Rate Route Frequency Ordered Stop   10/09/16 0745  ceFAZolin (ANCEF) IVPB 2g/100 mL premix     2 g 200 mL/hr over  30 Minutes Intravenous To Short Stay 10/08/16 1144 10/10/16 0745  Objective: Physical Exam: Vitals:   10/07/16 1525 10/07/16 2053 10/08/16 0516 10/08/16 1300  BP: 134/72 (!) 108/58 120/66 121/68  Pulse: 77 66 72 72  Resp: 16 18 18 18   Temp: 98.1 F (36.7 C) 98 F (36.7 C) 98.8 F (37.1 C) 98.4 F (36.9 C)  TempSrc: Oral Oral Oral Oral  SpO2: 100% 98% 99% 99%  Weight:      Height:        Intake/Output Summary (Last 24 hours) at 10/08/16 1746 Last data filed at 10/08/16 1300  Gross per 24 hour  Intake                0 ml  Output              700 ml  Net             -700 ml   Filed Weights   10/06/16 1724  Weight: 114.8 kg (253 lb)   General: Alert, Awake and Oriented to Time, Place and Person. Appear in moderate distress, affect appropriate Eyes: PERRL, Conjunctiva normal ENT: Oral Mucosa clear moist. Neck: no JVD, no Abnormal Mass Or lumps Cardiovascular: S1 and S2 Present, no Murmur, Peripheral Pulses Present Respiratory: normal respiratory effort, Bilateral Air entry equal and Decreased, no use of accessory muscle, Clear to Auscultation, no Crackles, no wheezes Abdomen: Bowel Sound present, Soft and no tenderness, no hernia Skin: no redness, no Rash, no induration Extremities: no Pedal edema, no calf tenderness Neurologic: Grossly no focal neuro deficit. Bilaterally Equal motor strength Cerebellar test normal finger nose finger. Gait not checked due to patient safety concerns.     Data Reviewed: CBC:  Recent Labs Lab 10/06/16 1755  WBC 9.9  NEUTROABS 8.1*  HGB 13.6  HCT 39.9  MCV 90.3  PLT 122*   Basic Metabolic Panel:  Recent Labs Lab 10/06/16 1755 10/07/16 0518 10/08/16 0525  NA 139 140 137  K 4.7 3.9 4.3  CL 105 105 102  CO2 25 26 27   GLUCOSE 122* 117* 114*  BUN 37* 33* 18  CREATININE 1.65* 1.25* 1.06  CALCIUM 9.3 8.8* 8.6*  PHOS  --   --  2.6   Liver Function Tests:  Recent Labs Lab 10/08/16 0525  ALBUMIN 3.4*   No results  for input(s): LIPASE, AMYLASE in the last 168 hours. No results for input(s): AMMONIA in the last 168 hours. Cardiac Enzymes: No results for input(s): CKTOTAL, CKMB, CKMBINDEX, TROPONINI in the last 168 hours. BNP (last 3 results) No results for input(s): BNP in the last 8760 hours. CBG:  Recent Labs Lab 10/07/16 1717 10/07/16 2135 10/08/16 0618 10/08/16 1202 10/08/16 1709  GLUCAP 94 121* 112* 97 89   Recent Results (from the past 240 hour(s))  Surgical pcr screen     Status: Abnormal   Collection Time: 10/07/16 12:29 AM  Result Value Ref Range Status   MRSA, PCR INVALID RESULTS, SPECIMEN SENT FOR CULTURE (A) NEGATIVE Final   Staphylococcus aureus INVALID RESULTS, SPECIMEN SENT FOR CULTURE (A) NEGATIVE Final    Comment: Results Called to: Gonzella Lex RN, AT [phone] 10/07/16 BY C. JESSUP   MRSA culture     Status: None   Collection Time: 10/07/16 12:29 AM  Result Value Ref Range Status   Specimen Description NASOPHARYNGEAL  Final   Special Requests NONE  Final   Culture NO MRSA DETECTED  Final   Report Status 10/08/2016 FINAL  Final    Studies: Mr Knee Right Wo Contrast  Result  Date: 10/08/2016 CLINICAL DATA:  Status post fall. Right leg injury. Right knee hemarthrosis. EXAM: MRI OF THE RIGHT KNEE WITHOUT CONTRAST TECHNIQUE: Multiplanar, multisequence MR imaging of the knee was performed. No intravenous contrast was administered. COMPARISON:  None. FINDINGS: MENISCI Medial meniscus: Small radial tear of the free edge of the body of the medial meniscus. Lateral meniscus: Radial tear of the free edge of the posterior horn of the lateral meniscus. Horizontal tear of the body of the lateral meniscus extending to the free edge. LIGAMENTS Cruciates:  Intact ACL and PCL. Collaterals: Medial collateral ligament is intact. Fibular collateral ligament is attenuated distally concerning for a partial tear. Iliotibial band and biceps femoris tendon are intact. CARTILAGE Patellofemoral:  No chondral  defect. Medial: Partial-thickness cartilage loss of medial femorotibial compartment. Lateral: Partial-thickness cartilage loss of the lateral femorotibial compartment. Joint: Large hemarthrosis. Normal Hoffa's fat. No plical thickening. Popliteal Fossa:  No Baker cyst.  Intact popliteus tendon. Extensor Mechanism: Intact quadriceps tendon and patellar tendon. Intact medial and lateral patellar retinaculum. Intact MPFL. Bones: Comminuted fracture of the fibular head. No other marrow signal abnormality. No other fracture or dislocation. Other: Severe edema in the proximal soleus muscle and tibia [place] anterior muscle likely reflecting muscle strain. IMPRESSION: 1. Large hemarthrosis. 2. Comminuted fracture of the fibular head. 3. Small radial tear of the free edge of the body of the medial meniscus. 4. Radial tear of the free edge of the posterior horn of the lateral meniscus. Horizontal tear of the body of the lateral meniscus extending to the free edge. 5. Fibular collateral ligament is attenuated distally concerning for a partial tear. Iliotibial band and biceps femoris tendon are intact. Electronically Signed   By: Kathreen Devoid   On: 10/08/2016 08:14     Scheduled Meds: . docusate sodium  100 mg Oral BID  . enoxaparin (LOVENOX) injection  40 mg Subcutaneous Q24H  . insulin aspart  0-9 Units Subcutaneous TID WC  . latanoprost  1 drop Both Eyes QHS  . methocarbamol  500 mg Oral TID  . multivitamin with minerals  1 tablet Oral Daily  . polyethylene glycol  17 g Oral Daily   Continuous Infusions: . sodium chloride 75 mL/hr at 10/08/16 1351  . [START ON 10/09/2016]  ceFAZolin (ANCEF) IV    . methocarbamol (ROBAXIN)  IV     PRN Meds: HYDROmorphone (DILAUDID) injection, methocarbamol **OR** methocarbamol (ROBAXIN)  IV, oxyCODONE, oxyCODONE-acetaminophen  Time spent: 30 minutes  Author: Berle Mull, MD [facility] Pager: [phone] 10/08/2016 5:46 PM  If 7PM-7AM, please contact night-coverage  at [email or web address], password [facility]

## 2016-10-08 NOTE — Progress Notes (Signed)
Orthopedics Progress Note  Subjective: Patient reports that MRI just done, did not know knee was swollen, but relieved swelling removed with aspiration  Objective:  Vitals:   10/07/16 2053 10/08/16 0516  BP: (!) 108/58 120/66  Pulse: 66 72  Resp: 18 18  Temp: 98 F (36.7 C) 98.8 F (37.1 C)    General: Awake and alert  Musculoskeletal: right knee with no effusion and minimal tenderness, splint in place, wiggles toes Neurovascularly intact  Lab Results  Component Value Date   WBC 9.9 10/06/2016   HGB 13.6 10/06/2016   HCT 39.9 10/06/2016   MCV 90.3 10/06/2016   PLT 129 (L) 10/06/2016       Component Value Date/Time   NA 137 10/08/2016 0525   NA 143 05/15/2015 1007   K 4.3 10/08/2016 0525   CL 102 10/08/2016 0525   CO2 27 10/08/2016 0525   GLUCOSE 114 (H) 10/08/2016 0525   BUN 18 10/08/2016 0525   BUN 25 05/15/2015 1007   CREATININE 1.06 10/08/2016 0525   CALCIUM 8.6 (L) 10/08/2016 0525   GFRNONAA >60 10/08/2016 0525   GFRAA >60 10/08/2016 0525    Lab Results  Component Value Date   INR 1.13 10/06/2016   INR 1.10 10/06/2016    Assessment/Plan: S/p fall with comminuted tib fib fracture awaiting surgery MRI not read but shows proximal fib fracture, no proximal tibia fracture seen, meniscal tears and early OA noted Continue leg elevation to control swelling DVT prophylaxis - Lovenox and mechanical Appreciate Dr Marcelino Scot assuming care    Doran Heater. Veverly Fells, MD 10/08/2016 7:50 AM

## 2016-10-08 NOTE — Anesthesia Preprocedure Evaluation (Addendum)
Anesthesia Evaluation  Patient identified by MRN, date of birth, ID band Patient awake    Reviewed: Allergy & Precautions, NPO status , Patient's Chart, lab work & pertinent test results  Airway Mallampati: II  TM Distance: >3 FB Neck ROM: Full    Dental no notable dental hx. (+) Teeth Intact, Caps   Pulmonary former smoker,    Pulmonary exam normal breath sounds clear to auscultation       Cardiovascular hypertension, Pt. on medications Normal cardiovascular exam Rhythm:Regular Rate:Normal     Neuro/Psych negative neurological ROS  negative psych ROS   GI/Hepatic negative GI ROS, Neg liver ROS,   Endo/Other  Hyperlipidemia Obesity    Renal/GU negative Renal ROS  negative genitourinary   Musculoskeletal Spiral Fx Right Tibia    Abdominal   Peds  Hematology Thrombocytopenia    Anesthesia Other Findings   Reproductive/Obstetrics                            Anesthesia Physical Anesthesia Plan  ASA: III  Anesthesia Plan: General   Post-op Pain Management:    Induction: Intravenous  PONV Risk Score and Plan: 3 and Ondansetron, Dexamethasone, Propofol, Midazolam and Treatment may vary due to age  Airway Management Planned: Oral ETT  Additional Equipment:   Intra-op Plan:   Post-operative Plan: Extubation in OR  Informed Consent:   Plan Discussed with: Anesthesiologist, CRNA and Surgeon  Anesthesia Plan Comments:        Anesthesia Quick Evaluation

## 2016-10-08 NOTE — Progress Notes (Signed)
Physical medicine rehabilitation consult requested. Await surgical intervention for comment due to tibia-fibula fracture. Physical occupational therapy to follow-up postoperatively with rehabilitation recommendations.

## 2016-10-08 NOTE — Progress Notes (Signed)
Orthopedic Trauma Service Progress Note    Subjective:  Seems to be doing ok Some pain with mobilizing to chair today R leg feels really heavy   Wants to do inpatient rehab  Very apprehensive about post op pain   Denies R knee pain   Review of Systems  Constitutional: Negative for chills and fever.  Respiratory: Negative for shortness of breath and wheezing.   Cardiovascular: Negative for chest pain and palpitations.  Neurological: Negative for tingling and sensory change.    Objective:   VITALS:   Vitals:   10/07/16 0344 10/07/16 1525 10/07/16 2053 10/08/16 0516  BP: 111/62 134/72 (!) 108/58 120/66  Pulse: 69 77 66 72  Resp: 18 16 18 18   Temp: 98.9 F (37.2 C) 98.1 F (36.7 C) 98 F (36.7 C) 98.8 F (37.1 C)  TempSrc: Oral Oral Oral Oral  SpO2: 99% 100% 98% 99%  Weight:      Height:        Intake/Output      06/06 0701 - 06/07 0700 06/07 0701 - 06/08 0700   I.V. (mL/kg) 1481.3 (12.9)    IV Piggyback     Total Intake(mL/kg) 1481.3 (12.9)    Urine (mL/kg/hr) 975 (0.4) 300 (0.5)   Total Output 975 300   Net +506.3 -300        Urine Occurrence 1 x      LABS  Results for orders placed or performed during the hospital encounter of 10/06/16 (from the past 24 hour(s))  Glucose, capillary     Status: None   Collection Time: 10/07/16  5:17 PM  Result Value Ref Range   Glucose-Capillary 94 65 - 99 mg/dL   Comment 1 Notify RN    Comment 2 Document in Chart   Glucose, capillary     Status: Abnormal   Collection Time: 10/07/16  9:35 PM  Result Value Ref Range   Glucose-Capillary 121 (H) 65 - 99 mg/dL  Renal function panel     Status: Abnormal   Collection Time: 10/08/16  5:25 AM  Result Value Ref Range   Sodium 137 135 - 145 mmol/L   Potassium 4.3 3.5 - 5.1 mmol/L   Chloride 102 101 - 111 mmol/L   CO2 27 22 - 32 mmol/L   Glucose, Bld 114 (H) 65 - 99 mg/dL   BUN 18 6 - 20 mg/dL   Creatinine, Ser 1.06 0.61 - 1.24 mg/dL   Calcium  8.6 (L) 8.9 - 10.3 mg/dL   Phosphorus 2.6 2.5 - 4.6 mg/dL   Albumin 3.4 (L) 3.5 - 5.0 g/dL   GFR calc non Af Amer >60 >60 mL/min   GFR calc Af Amer >60 >60 mL/min   Anion gap 8 5 - 15  Glucose, capillary     Status: Abnormal   Collection Time: 10/08/16  6:18 AM  Result Value Ref Range   Glucose-Capillary 112 (H) 65 - 99 mg/dL  Glucose, capillary     Status: None   Collection Time: 10/08/16 12:02 PM  Result Value Ref Range   Glucose-Capillary 97 65 - 99 mg/dL   Comment 1 Notify RN    Comment 2 Document in Chart      PHYSICAL EXAM:   Gen: resting comfortably in chair, NAD, appears well Lungs: breathing unlabored Cardiac: RRR Ext:       Right Lower Extremity   Splint c/d/i  Ext warm  + DP pulse  EHL, FHL, lesser toe motor intact  DPN, SPN, TN sensation  intact  No pain with passive stretch  Knee effusion improved   No joint line pain   Assessment/Plan:     Principal Problem:   Fracture of tibial shaft, right, closed Active Problems:   Essential (primary) hypertension   Closed fracture of right tibial plafond with fibula involvement   Anti-infectives    Start     Dose/Rate Route Frequency Ordered Stop   10/09/16 0745  ceFAZolin (ANCEF) IVPB 2g/100 mL premix     2 g 200 mL/hr over 30 Minutes Intravenous To Short Stay 10/08/16 1144 10/10/16 0745    .  POD/HD#: 16  76 year old white male fall with right tibial shaft and right tibial plafond fracture  - Fall  Likely due to orthostasis  -Right tibial shaft and right tibial plafond fracture  OR tomorrow for intramedullary nailing and ORIF right tibia  We will be nonweightbearing for 8 weeks postoperatively  Will have unrestricted range of motion of his right knee.  He will be splinted for 2 weeks postoperatively and then initiate ankle range of motion   Continue with aggressive ice and elevation for today.  Okay to mobilize with therapy   Inpatient rehabilitation consultation placed. Evaluation to take place  after surgery  - Right knee internal derangement: Medial and lateral meniscal tears, partial tear LCL, degenerative changes medial and lateral compartments   I  think the majority of findings is knee are degenerative in nature.  see obviously has some acute injury given the presence of a hemarthrosis.  Clinically patient is not having any symptoms. No knee pain specifically no joint line pain.  Will likely manage nonoperatively for the time being. Patient likely progressing towards a total knee replacement however again treating patient's symptoms not imaging.   We will evaluate the patient's knee intraoperatively for instability. May require bracing given partial LCL tear however patient will be nonweightbearing for 8 weeks on his right leg which should be ample time for healing  - Pain management:  Patient seems to be biggest concern for the patient particularly postoperatively. He continues to compare the pain that he had after his left ankle was repaired while in the [employer].   We discussed multiple different pain management options for him. I still think I would avoid any type of nerve block so he can continue to monitor the patient for compartment syndrome in the postoperative setting.  He did seem agreeable to the use of PCA postoperatively. We will monitor his use of this postoperatively as well and quickly transition him back to oral regimen   Would plan to do scheduled IV Tylenol postoperatively as well. I will actually start IV Tylenol today  - ABL anemia/Hemodynamics  CBC in the morning  Echo is currently being done   Blood pressure and heart rate stable  - Medical issues   Orthostasis   Hypertensive medications are being held   Working assumption is that concomitant use of his antihypertensive medications along with a 30+ weight loss over the last several months had precipitated orthostatic event   Patient's blood pressures have been fantastic during his hospital stay  -  DVT/PE prophylaxis:  Lovenox was given this morning. Hold Lovenox dose tomorrow. Will resume postop day 1  Anticipate Lovenox for 4 weeks postoperatively  - ID:   Perioperative antibiotics  - Metabolic Bone Disease:  We will check vitamin D levels  - Activity:  Up with assistance  Nonweightbearing right leg  - FEN/GI prophylaxis/Foley/Lines:  Diet as tolerated  NPO after  midnight  - Dispo:  OR tomorrow for fixation of his tibia   If the echo is negative, we are fine with medicine service signing off. Appreciate their assistance.  Plan on discontinuing antihypertensives at discharge   Jari Pigg, PA-C [facility] [phone] (O) 10/08/2016, 12:08 PM

## 2016-10-09 ENCOUNTER — Inpatient Hospital Stay (HOSPITAL_COMMUNITY): Payer: Medicare Other

## 2016-10-09 ENCOUNTER — Inpatient Hospital Stay (HOSPITAL_COMMUNITY): Payer: Medicare Other | Admitting: Anesthesiology

## 2016-10-09 ENCOUNTER — Encounter (HOSPITAL_COMMUNITY): Payer: Self-pay | Admitting: Anesthesiology

## 2016-10-09 ENCOUNTER — Encounter (HOSPITAL_COMMUNITY)
Admission: EM | Disposition: A | Discharge status: Rehabilitation Facility | Payer: Self-pay | Source: Home / Self Care | Attending: Orthopedic Surgery

## 2016-10-09 HISTORY — PX: TIBIA IM NAIL INSERTION: SHX2516

## 2016-10-09 HISTORY — PX: ORIF ANKLE FRACTURE: SHX5408

## 2016-10-09 LAB — COMPREHENSIVE METABOLIC PANEL
ALT: 14 U/L — ABNORMAL LOW (ref 17–63)
AST: 21 U/L (ref 15–41)
Albumin: 3.7 g/dL (ref 3.5–5.0)
Alkaline Phosphatase: 39 U/L (ref 38–126)
Anion gap: 5 (ref 5–15)
BILIRUBIN TOTAL: 1.1 mg/dL (ref 0.3–1.2)
BUN: 13 mg/dL (ref 6–20)
CO2: 29 mmol/L (ref 22–32)
Calcium: 8.7 mg/dL — ABNORMAL LOW (ref 8.9–10.3)
Chloride: 104 mmol/L (ref 101–111)
Creatinine, Ser: 1.07 mg/dL (ref 0.61–1.24)
Glucose, Bld: 132 mg/dL — ABNORMAL HIGH (ref 65–99)
POTASSIUM: 4.4 mmol/L (ref 3.5–5.1)
Sodium: 138 mmol/L (ref 135–145)
TOTAL PROTEIN: 5.9 g/dL — AB (ref 6.5–8.1)

## 2016-10-09 LAB — CBC
HEMATOCRIT: 36 % — AB (ref 39.0–52.0)
Hemoglobin: 11.8 g/dL — ABNORMAL LOW (ref 13.0–17.0)
MCH: 30.2 pg (ref 26.0–34.0)
MCHC: 32.8 g/dL (ref 30.0–36.0)
MCV: 92.1 fL (ref 78.0–100.0)
Platelets: 123 10*3/uL — ABNORMAL LOW (ref 150–400)
RBC: 3.91 MIL/uL — ABNORMAL LOW (ref 4.22–5.81)
RDW: 13.4 % (ref 11.5–15.5)
WBC: 6.9 10*3/uL (ref 4.0–10.5)

## 2016-10-09 LAB — APTT: aPTT: 23 seconds — ABNORMAL LOW (ref 24–36)

## 2016-10-09 LAB — PROTIME-INR
INR: 1.09
PROTHROMBIN TIME: 14.2 s (ref 11.4–15.2)

## 2016-10-09 LAB — GLUCOSE, CAPILLARY: Glucose-Capillary: 98 mg/dL (ref 65–99)

## 2016-10-09 LAB — HEMOGLOBIN A1C
Hgb A1c MFr Bld: 5.7 % — ABNORMAL HIGH (ref 4.8–5.6)
Mean Plasma Glucose: 117 mg/dL

## 2016-10-09 LAB — MAGNESIUM: Magnesium: 1.9 mg/dL (ref 1.7–2.4)

## 2016-10-09 LAB — PHOSPHORUS: Phosphorus: 3.4 mg/dL (ref 2.5–4.6)

## 2016-10-09 SURGERY — INSERTION, INTRAMEDULLARY ROD, TIBIA
Anesthesia: General | Laterality: Right

## 2016-10-09 MED ORDER — FENTANYL CITRATE (PF) 250 MCG/5ML IJ SOLN
INTRAMUSCULAR | Status: AC
  Filled 2016-10-09: qty 5

## 2016-10-09 MED ORDER — PROMETHAZINE HCL 25 MG/ML IJ SOLN: 6.2500 mg | INTRAMUSCULAR | Status: DC | PRN

## 2016-10-09 MED ORDER — HYDROMORPHONE HCL 1 MG/ML IJ SOLN
INTRAMUSCULAR | Status: AC
Start: 2016-10-09 — End: 2016-10-09
  Filled 2016-10-09: qty 1

## 2016-10-09 MED ORDER — ONDANSETRON HCL 4 MG/2ML IJ SOLN: 4.0000 mg | Freq: Four times a day (QID) | INTRAMUSCULAR | Status: DC | PRN

## 2016-10-09 MED ORDER — CEFAZOLIN SODIUM-DEXTROSE 1-4 GM/50ML-% IV SOLN
1.0000 g | Freq: Four times a day (QID) | INTRAVENOUS | Status: DC
  Filled 2016-10-09 (×2): qty 50

## 2016-10-09 MED ORDER — PHENYLEPHRINE 40 MCG/ML (10ML) SYRINGE FOR IV PUSH (FOR BLOOD PRESSURE SUPPORT)
PREFILLED_SYRINGE | INTRAVENOUS | Status: AC
  Filled 2016-10-09: qty 10

## 2016-10-09 MED ORDER — ACETAMINOPHEN 10 MG/ML IV SOLN
1000.0000 mg | Freq: Four times a day (QID) | INTRAVENOUS | Status: AC
  Administered 2016-10-09 – 2016-10-10 (×4): 1000 mg via INTRAVENOUS
  Filled 2016-10-09 (×4): qty 100

## 2016-10-09 MED ORDER — ACETAMINOPHEN 650 MG RE SUPP: 650.0000 mg | Freq: Four times a day (QID) | RECTAL | Status: DC | PRN

## 2016-10-09 MED ORDER — FENTANYL CITRATE (PF) 100 MCG/2ML IJ SOLN
INTRAMUSCULAR | Status: DC | PRN
  Administered 2016-10-09: 50 ug via INTRAVENOUS
  Administered 2016-10-09: 100 ug via INTRAVENOUS
  Administered 2016-10-09 (×2): 50 ug via INTRAVENOUS

## 2016-10-09 MED ORDER — ONDANSETRON HCL 4 MG/2ML IJ SOLN
INTRAMUSCULAR | Status: AC
  Filled 2016-10-09: qty 2

## 2016-10-09 MED ORDER — PROPOFOL 10 MG/ML IV BOLUS
INTRAVENOUS | Status: AC
  Filled 2016-10-09: qty 40

## 2016-10-09 MED ORDER — LIDOCAINE HCL (CARDIAC) 20 MG/ML IV SOLN
INTRAVENOUS | Status: DC | PRN
  Administered 2016-10-09: 80 mg via INTRAVENOUS

## 2016-10-09 MED ORDER — CEFAZOLIN SODIUM-DEXTROSE 1-4 GM/50ML-% IV SOLN
1.0000 g | Freq: Four times a day (QID) | INTRAVENOUS | Status: AC
  Administered 2016-10-09 (×2): 1 g via INTRAVENOUS
  Filled 2016-10-09 (×2): qty 50

## 2016-10-09 MED ORDER — HYDROMORPHONE HCL 1 MG/ML IJ SOLN
0.2500 mg | INTRAMUSCULAR | Status: DC | PRN
  Administered 2016-10-09 (×3): 0.5 mg via INTRAVENOUS

## 2016-10-09 MED ORDER — ALBUMIN HUMAN 5 % IV SOLN
INTRAVENOUS | Status: DC | PRN
  Administered 2016-10-09: 10:00:00 via INTRAVENOUS

## 2016-10-09 MED ORDER — PROPOFOL 10 MG/ML IV BOLUS
INTRAVENOUS | Status: DC | PRN
  Administered 2016-10-09: 150 mg via INTRAVENOUS
  Administered 2016-10-09: 30 mg via INTRAVENOUS

## 2016-10-09 MED ORDER — ONDANSETRON HCL 4 MG PO TABS: 4.0000 mg | ORAL_TABLET | Freq: Four times a day (QID) | ORAL | Status: DC | PRN

## 2016-10-09 MED ORDER — ACETAMINOPHEN 325 MG PO TABS: 650.0000 mg | ORAL_TABLET | Freq: Four times a day (QID) | ORAL | Status: DC | PRN

## 2016-10-09 MED ORDER — SUGAMMADEX SODIUM 200 MG/2ML IV SOLN
INTRAVENOUS | Status: AC
Start: 2016-10-09 — End: 2016-10-09
  Filled 2016-10-09: qty 2

## 2016-10-09 MED ORDER — ROCURONIUM BROMIDE 10 MG/ML (PF) SYRINGE
PREFILLED_SYRINGE | INTRAVENOUS | Status: AC
  Filled 2016-10-09: qty 5

## 2016-10-09 MED ORDER — SUGAMMADEX SODIUM 200 MG/2ML IV SOLN
INTRAVENOUS | Status: DC | PRN
  Administered 2016-10-09: 229.6 mg via INTRAVENOUS

## 2016-10-09 MED ORDER — DIPHENHYDRAMINE HCL 12.5 MG/5ML PO ELIX: 12.5000 mg | ORAL_SOLUTION | Freq: Four times a day (QID) | ORAL | Status: DC | PRN

## 2016-10-09 MED ORDER — 0.9 % SODIUM CHLORIDE (POUR BTL) OPTIME
TOPICAL | Status: DC | PRN
  Administered 2016-10-09: 1000 mL

## 2016-10-09 MED ORDER — DIPHENHYDRAMINE HCL 50 MG/ML IJ SOLN: 12.5000 mg | Freq: Four times a day (QID) | INTRAMUSCULAR | Status: DC | PRN

## 2016-10-09 MED ORDER — ROCURONIUM BROMIDE 100 MG/10ML IV SOLN
INTRAVENOUS | Status: DC | PRN
  Administered 2016-10-09: 60 mg via INTRAVENOUS
  Administered 2016-10-09: 20 mg via INTRAVENOUS

## 2016-10-09 MED ORDER — OXYCODONE HCL 5 MG PO TABS
5.0000 mg | ORAL_TABLET | ORAL | Status: DC | PRN
  Administered 2016-10-09: 15 mg via ORAL
  Administered 2016-10-10: 10 mg via ORAL
  Administered 2016-10-10: 15 mg via ORAL
  Administered 2016-10-10 (×2): 10 mg via ORAL
  Administered 2016-10-10: 15 mg via ORAL
  Administered 2016-10-11: 10 mg via ORAL
  Administered 2016-10-12 (×2): 5 mg via ORAL
  Filled 2016-10-09: qty 2
  Filled 2016-10-09: qty 3
  Filled 2016-10-09: qty 1
  Filled 2016-10-09 (×2): qty 2
  Filled 2016-10-09 (×2): qty 3
  Filled 2016-10-09: qty 2
  Filled 2016-10-09: qty 3

## 2016-10-09 MED ORDER — METOCLOPRAMIDE HCL 5 MG PO TABS: 5.0000 mg | ORAL_TABLET | Freq: Three times a day (TID) | ORAL | Status: DC | PRN

## 2016-10-09 MED ORDER — LIDOCAINE 2% (20 MG/ML) 5 ML SYRINGE
INTRAMUSCULAR | Status: AC
  Filled 2016-10-09: qty 5

## 2016-10-09 MED ORDER — ACETAMINOPHEN 500 MG PO TABS
1000.0000 mg | ORAL_TABLET | Freq: Four times a day (QID) | ORAL | Status: DC
  Administered 2016-10-10 – 2016-10-12 (×7): 1000 mg via ORAL
  Filled 2016-10-09 (×8): qty 2

## 2016-10-09 MED ORDER — HYDROMORPHONE HCL 1 MG/ML IJ SOLN
INTRAMUSCULAR | Status: AC
  Filled 2016-10-09: qty 0.5

## 2016-10-09 MED ORDER — OXYCODONE HCL 5 MG PO TABS
5.0000 mg | ORAL_TABLET | Freq: Four times a day (QID) | ORAL | Status: DC | PRN
Start: 2016-10-09 — End: 2016-10-09
  Administered 2016-10-09: 15 mg via ORAL
  Filled 2016-10-09: qty 3

## 2016-10-09 MED ORDER — ONDANSETRON HCL 4 MG/2ML IJ SOLN
INTRAMUSCULAR | Status: DC | PRN
  Administered 2016-10-09: 4 mg via INTRAVENOUS

## 2016-10-09 MED ORDER — MEPERIDINE HCL 25 MG/ML IJ SOLN: 6.2500 mg | INTRAMUSCULAR | Status: DC | PRN

## 2016-10-09 MED ORDER — HYDROMORPHONE 1 MG/ML IV SOLN
INTRAVENOUS | Status: DC
  Administered 2016-10-09: 2 mg via INTRAVENOUS
  Administered 2016-10-09: 25 mg via INTRAVENOUS
  Filled 2016-10-09: qty 25

## 2016-10-09 MED ORDER — METOCLOPRAMIDE HCL 5 MG/ML IJ SOLN: 5.0000 mg | Freq: Three times a day (TID) | INTRAMUSCULAR | Status: DC | PRN

## 2016-10-09 MED ORDER — BISACODYL 10 MG RE SUPP: 10.0000 mg | Freq: Every day | RECTAL | Status: DC | PRN

## 2016-10-09 MED ORDER — SODIUM CHLORIDE 0.9% FLUSH: 9.0000 mL | INTRAVENOUS | Status: DC | PRN

## 2016-10-09 MED ORDER — PHENYLEPHRINE HCL 10 MG/ML IJ SOLN
INTRAMUSCULAR | Status: DC | PRN
  Administered 2016-10-09: 80 ug via INTRAVENOUS
  Administered 2016-10-09 (×2): 40 ug via INTRAVENOUS
  Administered 2016-10-09 (×3): 80 ug via INTRAVENOUS

## 2016-10-09 MED ORDER — LACTATED RINGERS IV SOLN
INTRAVENOUS | Status: DC | PRN
  Administered 2016-10-09 (×2): via INTRAVENOUS

## 2016-10-09 MED ORDER — NALOXONE HCL 0.4 MG/ML IJ SOLN: 0.4000 mg | INTRAMUSCULAR | Status: DC | PRN

## 2016-10-09 MED ORDER — METHOCARBAMOL 500 MG PO TABS
ORAL_TABLET | ORAL | Status: AC
  Administered 2016-10-10: 500 mg via ORAL
  Filled 2016-10-09: qty 1

## 2016-10-09 SURGICAL SUPPLY — 81 items
BANDAGE ACE 4X5 VEL STRL LF (GAUZE/BANDAGES/DRESSINGS) IMPLANT
BANDAGE ACE 6X5 VEL STRL LF (GAUZE/BANDAGES/DRESSINGS) ×3 IMPLANT
BANDAGE ELASTIC 6 VELCRO ST LF (GAUZE/BANDAGES/DRESSINGS) ×3 IMPLANT
BANDAGE ESMARK 6X9 LF (GAUZE/BANDAGES/DRESSINGS) ×1 IMPLANT
BIT DRILL 2.5X2.75 QC CALB (BIT) ×3 IMPLANT
BIT DRILL 3.5X5.5 QC CALB (BIT) ×3 IMPLANT
BIT DRILL CALIBRATED 4.3X320MM (BIT) ×1 IMPLANT
BIT DRILL CROWE POINT TWST 4.3 (DRILL) ×1 IMPLANT
BLADE SURG 10 STRL SS (BLADE) ×6 IMPLANT
BNDG ESMARK 6X9 LF (GAUZE/BANDAGES/DRESSINGS) ×3
BNDG GAUZE ELAST 4 BULKY (GAUZE/BANDAGES/DRESSINGS) IMPLANT
BRUSH SCRUB SURG 4.25 DISP (MISCELLANEOUS) ×6 IMPLANT
CLOSURE WOUND 1/2 X4 (GAUZE/BANDAGES/DRESSINGS)
COVER MAYO STAND STRL (DRAPES) ×3 IMPLANT
COVER SURGICAL LIGHT HANDLE (MISCELLANEOUS) ×6 IMPLANT
DRAPE C-ARM 42X72 X-RAY (DRAPES) ×3 IMPLANT
DRAPE C-ARMOR (DRAPES) ×6 IMPLANT
DRAPE HALF SHEET 40X57 (DRAPES) ×6 IMPLANT
DRAPE INCISE IOBAN 66X45 STRL (DRAPES) IMPLANT
DRAPE U-SHAPE 47X51 STRL (DRAPES) ×3 IMPLANT
DRILL CALIBRATED 4.3X320MM (BIT) ×3
DRILL CROWE POINT TWIST 4.3 (DRILL) ×3
DRSG ADAPTIC 3X8 NADH LF (GAUZE/BANDAGES/DRESSINGS) IMPLANT
DRSG EMULSION OIL 3X3 NADH (GAUZE/BANDAGES/DRESSINGS) IMPLANT
DRSG MEPITEL 4X7.2 (GAUZE/BANDAGES/DRESSINGS) ×9 IMPLANT
ELECT REM PT RETURN 9FT ADLT (ELECTROSURGICAL) ×3
ELECTRODE REM PT RTRN 9FT ADLT (ELECTROSURGICAL) ×1 IMPLANT
GAUZE SPONGE 4X4 12PLY STRL (GAUZE/BANDAGES/DRESSINGS) ×3 IMPLANT
GLOVE BIO SURGEON STRL SZ7.5 (GLOVE) ×3 IMPLANT
GLOVE BIO SURGEON STRL SZ8 (GLOVE) ×6 IMPLANT
GLOVE BIO SURGEON STRL SZ8.5 (GLOVE) ×6 IMPLANT
GLOVE BIOGEL PI IND STRL 7.5 (GLOVE) ×1 IMPLANT
GLOVE BIOGEL PI IND STRL 8 (GLOVE) ×1 IMPLANT
GLOVE BIOGEL PI INDICATOR 7.5 (GLOVE) ×2
GLOVE BIOGEL PI INDICATOR 8 (GLOVE) ×2
GOWN STRL REUS W/ TWL LRG LVL3 (GOWN DISPOSABLE) ×2 IMPLANT
GOWN STRL REUS W/ TWL XL LVL3 (GOWN DISPOSABLE) ×1 IMPLANT
GOWN STRL REUS W/TWL LRG LVL3 (GOWN DISPOSABLE) ×4
GOWN STRL REUS W/TWL XL LVL3 (GOWN DISPOSABLE) ×2
GUIDEWIRE 2.6X80 BEAD TIP (WIRE) ×1 IMPLANT
GUIDWIRE 2.6X80 BEAD TIP (WIRE) ×3
KIT BASIN OR (CUSTOM PROCEDURE TRAY) ×3 IMPLANT
KIT ROOM TURNOVER OR (KITS) ×3 IMPLANT
MANIFOLD NEPTUNE II (INSTRUMENTS) ×3 IMPLANT
NAIL TIBIAL PHOENIX 12.0X400MM (Nail) ×3 IMPLANT
NEEDLE HYPO 21X1.5 SAFETY (NEEDLE) IMPLANT
NS IRRIG 1000ML POUR BTL (IV SOLUTION) ×3 IMPLANT
PACK GENERAL/GYN (CUSTOM PROCEDURE TRAY) IMPLANT
PACK ORTHO EXTREMITY (CUSTOM PROCEDURE TRAY) ×3 IMPLANT
PAD ARMBOARD 7.5X6 YLW CONV (MISCELLANEOUS) ×6 IMPLANT
PAD CAST 4YDX4 CTTN HI CHSV (CAST SUPPLIES) IMPLANT
PADDING CAST COTTON 4X4 STRL (CAST SUPPLIES)
PADDING CAST COTTON 6X4 STRL (CAST SUPPLIES) IMPLANT
SCREW CORT TI DBL LEAD 5X42 (Screw) ×3 IMPLANT
SCREW CORT TI DBL LEAD 5X48 (Screw) ×3 IMPLANT
SCREW CORT TI DBL LEAD 5X56 (Screw) ×3 IMPLANT
SCREW CORT TI DBL LEAD 5X75 (Screw) ×3 IMPLANT
SCREW CORT TI DBL LEAD 5X80 (Screw) ×3 IMPLANT
SCREW CORTICAL 3.5MM 50MM (Screw) ×3 IMPLANT
SPLINT PLASTER CAST XFAST 5X30 (CAST SUPPLIES) ×1 IMPLANT
SPLINT PLASTER XFAST SET 5X30 (CAST SUPPLIES) ×2
SPONGE LAP 18X18 X RAY DECT (DISPOSABLE) ×3 IMPLANT
STAPLER VISISTAT 35W (STAPLE) ×3 IMPLANT
STRIP CLOSURE SKIN 1/2X4 (GAUZE/BANDAGES/DRESSINGS) IMPLANT
SUCTION FRAZIER HANDLE 10FR (MISCELLANEOUS) ×2
SUCTION TUBE FRAZIER 10FR DISP (MISCELLANEOUS) ×1 IMPLANT
SUT ETHILON 3 0 PS 1 (SUTURE) ×6 IMPLANT
SUT PDS AB 2-0 CT1 27 (SUTURE) IMPLANT
SUT VIC AB 0 CT1 27 (SUTURE)
SUT VIC AB 0 CT1 27XBRD ANBCTR (SUTURE) IMPLANT
SUT VIC AB 1 CT1 27 (SUTURE) ×2
SUT VIC AB 1 CT1 27XBRD ANBCTR (SUTURE) ×1 IMPLANT
SUT VIC AB 2-0 CT1 27 (SUTURE) ×4
SUT VIC AB 2-0 CT1 TAPERPNT 27 (SUTURE) ×2 IMPLANT
TOWEL OR 17X24 6PK STRL BLUE (TOWEL DISPOSABLE) ×3 IMPLANT
TOWEL OR 17X26 10 PK STRL BLUE (TOWEL DISPOSABLE) ×6 IMPLANT
TUBE CONNECTING 12'X1/4 (SUCTIONS) ×1
TUBE CONNECTING 12X1/4 (SUCTIONS) ×2 IMPLANT
UNDERPAD 30X30 (UNDERPADS AND DIAPERS) ×3 IMPLANT
WATER STERILE IRR 1000ML POUR (IV SOLUTION) ×3 IMPLANT
YANKAUER SUCT BULB TIP NO VENT (SUCTIONS) IMPLANT

## 2016-10-09 NOTE — Brief Op Note (Signed)
10/06/2016 - 10/09/2016  10:35 AM  PATIENT:  Patrick Good  76 y.o. male  PRE-OPERATIVE DIAGNOSIS:   1. RIGHT TIBIAL SHAFT FRACTURE 2. RIGHT ANKLE FRACTURE INVOLVING PLAFOND/ TILLAUX  POST-OPERATIVE DIAGNOSIS:  RIGHT TIBIA FRACTURE 1. RIGHT TIBIAL SHAFT FRACTURE 2. RIGHT ANKLE FRACTURE INVOLVING PLAFOND/ TILLAUX  PROCEDURE:  Procedure(s): 1. INTRAMEDULLARY (IM) NAIL TIBIAL (Right) WITH BIOMET [place] 15mm x 477mm statically locked 2. OPEN REDUCTION INTERNAL FIXATION (ORIF) ANKLE FRACTURE (Right)  SURGEON:  Surgeon(s) and Role:    Altamese Gaule[place] Bridge, MD - Primary  ASSISTANTS: Ainsley Spinner, PA-C   ANESTHESIA:   general  EBL:  Total I/O In: 7169 [I.V.:1500; IV Piggyback:250] Out: 200 [Blood:200]  BLOOD ADMINISTERED:none  DRAINS: none   LOCAL MEDICATIONS USED:  NONE  SPECIMEN:  No Specimen  DISPOSITION OF SPECIMEN:  N/A  COUNTS:  YES  TOURNIQUET:  * No tourniquets in log *  DICTATION: .Other Dictation: Dictation Number written in chart  PLAN OF CARE: Admit to inpatient   PATIENT DISPOSITION:  PACU - hemodynamically stable.   Delay start of Pharmacological VTE agent (>24hrs) due to surgical blood loss or risk of bleeding: no

## 2016-10-09 NOTE — Anesthesia Postprocedure Evaluation (Signed)
Anesthesia Post Note  Patient: Patrick Good  Procedure(s) Performed: Procedure(s) (LRB): INTRAMEDULLARY (IM) NAIL TIBIAL (Right) OPEN REDUCTION INTERNAL FIXATION (ORIF) ANKLE FRACTURE (Right)     Patient location during evaluation: PACU Anesthesia Type: General Level of consciousness: awake and alert and oriented Pain management: pain level controlled Vital Signs Assessment: post-procedure vital signs reviewed and stable Respiratory status: spontaneous breathing, nonlabored ventilation and respiratory function stable Cardiovascular status: blood pressure returned to baseline and stable Postop Assessment: no signs of nausea or vomiting Anesthetic complications: no    Last Vitals:  Vitals:   10/09/16 1215 10/09/16 1337  BP: (!) 128/49 126/70  Pulse: 73 71  Resp: 16 16  Temp: 36.4 C 36.7 C    Last Pain:  Vitals:   10/09/16 1356  TempSrc:   PainSc: 4                  Tauriel Scronce A.

## 2016-10-09 NOTE — Transfer of Care (Signed)
Immediate Anesthesia Transfer of Care Note  Patient: Patrick Good  Procedure(s) Performed: Procedure(s): INTRAMEDULLARY (IM) NAIL TIBIAL (Right) OPEN REDUCTION INTERNAL FIXATION (ORIF) ANKLE FRACTURE (Right)  Patient Location: PACU  Anesthesia Type:General  Level of Consciousness: awake, alert , oriented and sedated  Airway & Oxygen Therapy: Patient Spontanous Breathing and Patient connected to nasal cannula oxygen  Post-op Assessment: Report given to RN, Post -op Vital signs reviewed and stable and Patient moving all extremities  Post vital signs: Reviewed and stable  Last Vitals:  Vitals:   10/09/16 0436 10/09/16 1100  BP: 129/70 125/63  Pulse: 71 71  Resp: 17 14  Temp: 37.1 C 36.4 C    Last Pain:  Vitals:   10/09/16 0436  TempSrc: Oral  PainSc:          Complications: No apparent anesthesia complications

## 2016-10-09 NOTE — Op Note (Signed)
10/06/2016 - 10/09/2016  10:35 AM  PATIENT:  Patrick Good  76 y.o. male  PRE-OPERATIVE DIAGNOSIS:   1. RIGHT TIBIAL SHAFT FRACTURE 2. RIGHT ANKLE FRACTURE INVOLVING PLAFOND/ TILLAUX  POST-OPERATIVE DIAGNOSIS:  RIGHT TIBIA FRACTURE 1. RIGHT TIBIAL SHAFT FRACTURE 2. RIGHT ANKLE FRACTURE INVOLVING PLAFOND/ TILLAUX  PROCEDURE:  Procedure(s): 1. INTRAMEDULLARY (IM) NAIL TIBIAL (Right) WITH BIOMET [place] 62mm x 435mm statically locked 2. OPEN REDUCTION INTERNAL FIXATION (ORIF) ANKLE FRACTURE (Right) 3. STRESS FLOURO OF SYNDESMOSIS  SURGEON:  Surgeon(s) and Role:    Altamese Stiles, MD - Primary  ASSISTANTS: Ainsley Spinner, PA-C   ANESTHESIA:   general  EBL:  Total I/O In: 3825 [I.V.:1500; IV Piggyback:250] Out: 200 [Blood:200]  BLOOD ADMINISTERED:none  DRAINS: none   LOCAL MEDICATIONS USED:  NONE  SPECIMEN:  No Specimen  DISPOSITION OF SPECIMEN:  N/A  COUNTS:  YES  TOURNIQUET:  None  PLAN OF CARE: Admit to inpatient   PATIENT DISPOSITION:  PACU - hemodynamically stable.   Delay start of Pharmacological VTE agent (>24hrs) due to surgical blood loss or risk of bleeding: no  BRIEF SUMMARY AND INDICATION FOR PROCEDURE:  Patient is a 76 year old who sustained severe right leg injuries in fall. The patient required splinting, elevation, and a period of soft tissue swelling resolution before safely undergoing surgical repair.  We did discuss the risks and benefits of surgical reconstruction including the possibility of malunion, nonunion, DVT, PE, heart attack, stroke, loss of motion, symptomatic hardware, need for further surgery, and others.   The patient acknowledged these risks and wished to proceed.  BRIEF SUMMARY OF PROCEDURE:  After administration of preoperative antibiotic, the patient was taken to the operating room.  Following a chlorhexidine wash, the right lower extremity was prepped with betadine scrub and paint and then draped in usual sterile fashion.   No tourniquet was used during the procedure.  I began with the tibial plafond component, brought in the C-arm, and then employed lag screw fixation perpendicular to the fracture lines at the joint through stab incisions to reduce and compress the articular injury   This consisted of securing the tillaux fragment of the anterolateral corner with an anterior-to-posterior screw along the lateral side of the tibia, over drilling the near cortex with a 3.5 drill and then securing fixation in the far cortex with a standard 3.5 mm screw which was checked for length. Once reconstruction of the plafond was complete, the bone foam was removed, radiolucent triangle brought in.  The knee was bent up to 90 degrees and a 2.5 cm incision was made extending proximally from the distal pole of the patella.  In the deep tissues a medial parapatellar retinacular incision was made and then the curved cannulated awl inserted anterior to the edge of the articular surface and just medial to the lateral tibial spine. It was advance into the center of the proximal tibia.  A ball-tipped guidewire was bent and then advanced into the proximal tibia and then across the fracture site, watching on orthogonal views until it was centered in the distal plafond.  While maintaining fracture reduction, the tibia was then reamed sequentially.  We encountered chatter at 12 mm, reamed to 13 mm, and placed 12 x 400 mm statically locked nail. We did use the proximal locks off the jig and perfect circle technique for the distal locks.  I also placed an anterior to posterior distal lock through a formal incision to safely retract the deep peroneal nerve and dorsalis  pedis vessel. All screws were checked for position within the nail and length.  Because of the patient's CT scan which demonstrated some fracture fragments adjacent to the syndesmosis, a stress evaluation was performed, consisting of external rotation after all fixation had been  placed in the tibia. Under live fluoro, I did not identify  any widening of the medial clear Space nor widening of the syndesmotic interval. Consequently it was deemed stable.  Ainsley Spinner, PA-C, assisted me throughout, and his assistance was necessary as I held the reduction while he reamed and placed the nail.  Wounds irrigated thoroughly, closed in standard layered fashion using 0 Vicryl, 2-0 Vicryl, 3-0 nylon.  Sterile gently compressive dressing and splint were applied.  The patient was then taken to the PACU in stable condition.  PROGNOSIS:  Patient will be nonweightbearing on the right lower extremity with unrestricted range of motion of the knee.  Will anticipate removing the sutures, re-evaluate in 10 to 14 days, at which time, he will likely be transitioned from splint into a CAM boot to allow for unrestricted range of motion of the ankle as well.  Weightbearing will be anticipated to resume at 6 weeks.  He will be on a formal pharmacologic DVT prophylaxis while in the hospital and Rehab consultation is anticipated at this time.  Altamese Gambrills, MD [facility] [phone] (p)

## 2016-10-09 NOTE — Anesthesia Procedure Notes (Signed)
Procedure Name: Intubation Date/Time: 10/09/2016 8:23 AM Performed by: Scheryl Darter Pre-anesthesia Checklist: Patient identified, Emergency Drugs available, Suction available and Patient being monitored Patient Re-evaluated:Patient Re-evaluated prior to inductionOxygen Delivery Method: Circle System Utilized Preoxygenation: Pre-oxygenation with 100% oxygen Intubation Type: IV induction Ventilation: Mask ventilation without difficulty Laryngoscope Size: Miller and 3 Grade View: Grade I Tube type: Oral Tube size: 7.5 mm Number of attempts: 1 Airway Equipment and Method: Stylet and Oral airway Placement Confirmation: ETT inserted through vocal cords under direct vision,  positive ETCO2 and breath sounds checked- equal and bilateral Secured at: 22 cm Tube secured with: Tape Dental Injury: Teeth and Oropharynx as per pre-operative assessment

## 2016-10-10 DIAGNOSIS — I959 Hypotension, unspecified: Secondary | ICD-10-CM

## 2016-10-10 DIAGNOSIS — S82871D Displaced pilon fracture of right tibia, subsequent encounter for closed fracture with routine healing: Secondary | ICD-10-CM

## 2016-10-10 DIAGNOSIS — D62 Acute posthemorrhagic anemia: Secondary | ICD-10-CM

## 2016-10-10 DIAGNOSIS — I9589 Other hypotension: Secondary | ICD-10-CM

## 2016-10-10 DIAGNOSIS — S82401D Unspecified fracture of shaft of right fibula, subsequent encounter for closed fracture with routine healing: Secondary | ICD-10-CM

## 2016-10-10 DIAGNOSIS — I10 Essential (primary) hypertension: Secondary | ICD-10-CM

## 2016-10-10 DIAGNOSIS — S82251D Displaced comminuted fracture of shaft of right tibia, subsequent encounter for closed fracture with routine healing: Secondary | ICD-10-CM

## 2016-10-10 DIAGNOSIS — S82201D Unspecified fracture of shaft of right tibia, subsequent encounter for closed fracture with routine healing: Secondary | ICD-10-CM

## 2016-10-10 DIAGNOSIS — S82209A Unspecified fracture of shaft of unspecified tibia, initial encounter for closed fracture: Secondary | ICD-10-CM

## 2016-10-10 DIAGNOSIS — K5903 Drug induced constipation: Secondary | ICD-10-CM

## 2016-10-10 DIAGNOSIS — S82831D Other fracture of upper and lower end of right fibula, subsequent encounter for closed fracture with routine healing: Secondary | ICD-10-CM

## 2016-10-10 DIAGNOSIS — I951 Orthostatic hypotension: Secondary | ICD-10-CM

## 2016-10-10 DIAGNOSIS — R55 Syncope and collapse: Secondary | ICD-10-CM

## 2016-10-10 DIAGNOSIS — N179 Acute kidney failure, unspecified: Secondary | ICD-10-CM

## 2016-10-10 DIAGNOSIS — N289 Disorder of kidney and ureter, unspecified: Secondary | ICD-10-CM

## 2016-10-10 DIAGNOSIS — R7303 Prediabetes: Secondary | ICD-10-CM

## 2016-10-10 DIAGNOSIS — S82409A Unspecified fracture of shaft of unspecified fibula, initial encounter for closed fracture: Secondary | ICD-10-CM

## 2016-10-10 DIAGNOSIS — G8918 Other acute postprocedural pain: Secondary | ICD-10-CM

## 2016-10-10 LAB — CBC
HCT: 33.6 % — ABNORMAL LOW (ref 39.0–52.0)
Hemoglobin: 10.8 g/dL — ABNORMAL LOW (ref 13.0–17.0)
MCH: 29.8 pg (ref 26.0–34.0)
MCHC: 32.1 g/dL (ref 30.0–36.0)
MCV: 92.8 fL (ref 78.0–100.0)
PLATELETS: 125 10*3/uL — AB (ref 150–400)
RBC: 3.62 MIL/uL — ABNORMAL LOW (ref 4.22–5.81)
RDW: 13.3 % (ref 11.5–15.5)
WBC: 6.5 10*3/uL (ref 4.0–10.5)

## 2016-10-10 LAB — VITAMIN D 25 HYDROXY (VIT D DEFICIENCY, FRACTURES): Vit D, 25-Hydroxy: 27.6 ng/mL — ABNORMAL LOW (ref 30.0–100.0)

## 2016-10-10 LAB — RENAL FUNCTION PANEL
Albumin: 3.3 g/dL — ABNORMAL LOW (ref 3.5–5.0)
Anion gap: 8 (ref 5–15)
BUN: 14 mg/dL (ref 6–20)
CO2: 31 mmol/L (ref 22–32)
CREATININE: 1.03 mg/dL (ref 0.61–1.24)
Calcium: 8.6 mg/dL — ABNORMAL LOW (ref 8.9–10.3)
Chloride: 101 mmol/L (ref 101–111)
GFR calc Af Amer: >60 mL/min (ref >60)
Glucose, Bld: 122 mg/dL — ABNORMAL HIGH (ref 65–99)
Phosphorus: 3.7 mg/dL (ref 2.5–4.6)
Potassium: 4.3 mmol/L (ref 3.5–5.1)
Sodium: 140 mmol/L (ref 135–145)

## 2016-10-10 LAB — BASIC METABOLIC PANEL
ANION GAP: 9 (ref 5–15)
BUN: 14 mg/dL (ref 6–20)
CALCIUM: 8.6 mg/dL — AB (ref 8.9–10.3)
CO2: 30 mmol/L (ref 22–32)
Chloride: 100 mmol/L — ABNORMAL LOW (ref 101–111)
Creatinine, Ser: 1.01 mg/dL (ref 0.61–1.24)
Glucose, Bld: 118 mg/dL — ABNORMAL HIGH (ref 65–99)
Potassium: 4.2 mmol/L (ref 3.5–5.1)
SODIUM: 139 mmol/L (ref 135–145)

## 2016-10-10 MED ORDER — BISACODYL 10 MG RE SUPP
10.0000 mg | Freq: Once | RECTAL | Status: AC
  Administered 2016-10-10: 10 mg via RECTAL
  Filled 2016-10-10: qty 1

## 2016-10-10 MED ORDER — POLYETHYLENE GLYCOL 3350 17 G PO PACK
17.0000 g | PACK | Freq: Two times a day (BID) | ORAL | Status: DC
  Administered 2016-10-10: 17 g via ORAL
  Filled 2016-10-10 (×3): qty 1

## 2016-10-10 NOTE — Evaluation (Signed)
Occupational Therapy Evaluation Patient Details Name: Patrick Good MRN: [id number] DOB: May 15, 1940 Today's Date: 10/10/2016    History of Present Illness Patrick Good is an 76 y.o. male with past medical hx of HTN, glaucoma and pre-diabetes and has been working on intentional weight loss and has lost about 30 pounds with diet, who presents to the ED with chief complaint of syncopal episode resulting in right leg injury.  Found to have R tib/fib fracture, underwent surgical fixation 10/09/16.  PMH L ankle fx 1980's, meniscal repair, HTN, hernia repair.    Clinical Impression   PTA, pt was independent and living with his wife. Currently, pt requires Mod A for LB ADLs and Min A for functional mobility and transfers. Pt is motivated to participate in therapy, return to PLOF, and maintain safety during ADLs and functional mobility. Pt would benefit from acute OT to increase pt safety and independence with ADLs and functional mobility. Recommend dc to CIR for intense therapy to optimize safety and functional performance before transitioning home.     Follow Up Recommendations  CIR    Equipment Recommendations  Other (comment) (Pending pt progress)    Recommendations for Other Services PT consult;Rehab consult     Precautions / Restrictions Precautions Precautions: Fall Required Braces or Orthoses: Other Brace/Splint Other Brace/Splint: R ankle splint Restrictions Weight Bearing Restrictions: Yes RLE Weight Bearing: Non weight bearing      Mobility Bed Mobility               General bed mobility comments: Up in recliner upon arrival  Transfers Overall transfer level: Needs assistance Equipment used: Rolling walker (2 wheeled) Transfers: Sit to/from Stand Sit to Stand: Min assist         General transfer comment: Min A for standing balance    Balance Overall balance assessment: Needs assistance Sitting-balance support: No upper extremity supported;Feet  supported Sitting balance-Leahy Scale: Good Sitting balance - Comments: donned plants with no LOB in sitting   Standing balance support: Single extremity supported;During functional activity Standing balance-Leahy Scale: Poor Standing balance comment: Required UE support and physical A. pt with LOB but able to self correct                           ADL either performed or assessed with clinical judgement   ADL Overall ADL's : Needs assistance/impaired Eating/Feeding: Set up;Sitting   Grooming: Set up;Sitting   Upper Body Bathing: Set up;Sitting;Supervision/ safety   Lower Body Bathing: Moderate assistance;Sit to/from stand   Upper Body Dressing : Set up;Supervision/safety;Sitting   Lower Body Dressing: Moderate assistance;Sit to/from stand Lower Body Dressing Details (indicate cue type and reason): Pt donned pants with Mod A to donn R pant leg over large cast. Pt then required Min A in standing to maintain balance while pulling pants over hips Toilet Transfer: Minimal assistance;BSC;Ambulation;Cueing for sequencing;Cueing for safety;RW           Functional mobility during ADLs: Minimal assistance;Rolling walker General ADL Comments: Pt demonstrating decreased fucntional performance. Pt required Min-Mod A for ADLs and functional mobility. Additionally, pt and wife anxious about falling again. Pt then has a tendancy to hurry through tasks making him more at risk to fall. Pt able to maintain WB status throughout ADLs and functional mobility; though the heavy splint does impacts pt's balance.      Vision         Perception     Praxis  Pertinent Vitals/Pain       Hand Dominance Right   Extremity/Trunk Assessment Upper Extremity Assessment Upper Extremity Assessment: Overall WFL for tasks assessed   Lower Extremity Assessment Lower Extremity Assessment: RLE deficits/detail RLE Deficits / Details: heavy splint and ace wrap to leg, unable to lift unaided,  able to wiggle toes, severe pain at times when elevating leg, reports meniscal injury as well in knee, able to perform quad set, ROM NT due to ankle pian   Cervical / Trunk Assessment Cervical / Trunk Assessment: Normal   Communication Communication Communication: No difficulties   Cognition Arousal/Alertness: Awake/alert Behavior During Therapy: WFL for tasks assessed/performed Overall Cognitive Status: Within Functional Limits for tasks assessed                                     General Comments  Provided pt, family, and NT with education on DME positioning for safe toilet transfer.     Exercises     Shoulder Instructions      Home Living Family/patient expects to be discharged to:: Private residence Living Arrangements: Spouse/significant other Available Help at Discharge: Family;Available 24 hours/day Type of Home: House Home Access: Stairs to enter [employer] of Steps: 2 (6") Entrance Stairs-Rails: None Home Layout: Two level;Able to live on main level with bedroom/bathroom     Bathroom Shower/Tub: Occupational psychologist: Handicapped height     Home Equipment: [place] - single point;Crutches;Hand held shower head          Prior Functioning/Environment Level of Independence: Independent                 OT Problem List: Decreased strength;Decreased range of motion;Decreased activity tolerance;Impaired balance (sitting and/or standing);Decreased safety awareness;Decreased knowledge of use of DME or AE;Decreased knowledge of precautions;Pain      OT Treatment/Interventions: Self-care/ADL training;Energy conservation;Therapeutic exercise;DME and/or AE instruction;Therapeutic activities;Patient/family education    OT Goals(Current goals can be found in the care plan section) Acute Rehab OT Goals Patient Stated Goal: To return to independent OT Goal Formulation: With patient Time For Goal Achievement: 10/24/16 Potential to  Achieve Goals: Good ADL Goals Pt Will Perform Grooming: with set-up;with supervision;standing Pt Will Perform Lower Body Bathing: with caregiver independent in assisting;with adaptive equipment;sit to/from stand;with min guard assist Pt Will Perform Lower Body Dressing: with min guard assist;with caregiver independent in assisting;with adaptive equipment;sit to/from stand Pt Will Transfer to Toilet: with set-up;with supervision;bedside commode;ambulating Pt Will Perform Toileting - Clothing Manipulation and hygiene: with set-up;with supervision;sit to/from stand Pt Will Perform Tub/Shower Transfer: Shower transfer;ambulating;rolling walker;with min guard assist;3 in 1  OT Frequency: Min 3X/week   Barriers to D/C:            Co-evaluation              AM-PAC PT "6 Clicks" Daily Activity     Outcome Measure Help from another person eating meals?: None Help from another person taking care of personal grooming?: A Little Help from another person toileting, which includes using toliet, bedpan, or urinal?: A Little Help from another person bathing (including washing, rinsing, drying)?: A Lot Help from another person to put on and taking off regular upper body clothing?: A Lot Help from another person to put on and taking off regular lower body clothing?: None 6 Click Score: 18   End of Session Equipment Utilized During Treatment: Gait belt;Rolling walker Nurse Communication:  Mobility status;Weight bearing status;Precautions  Activity Tolerance: Patient tolerated treatment well Patient left: in chair;with call bell/phone within reach;with family/visitor present  OT Visit Diagnosis: Unsteadiness on feet (R26.81);Other abnormalities of gait and mobility (R26.89);Muscle weakness (generalized) (M62.81);Pain;History of falling (Z91.81) Pain - Right/Left: Right Pain - part of body: Leg                Time: 1142-1207 OT Time Calculation (min): 25 min Charges:  OT General Charges $OT  Visit: 1 Procedure OT Evaluation $OT Eval Low Complexity: 1 Procedure OT Treatments $Self Care/Home Management : 8-22 mins G-Codes:     [place], OTR/L Acute Rehab Pager: [phone] Office: [place] 10/10/2016, 12:40 PM

## 2016-10-10 NOTE — Progress Notes (Signed)
Subjective: 1 Day Post-Op Procedure(s) (LRB): INTRAMEDULLARY (IM) NAIL TIBIAL (Right) OPEN REDUCTION INTERNAL FIXATION (ORIF) ANKLE FRACTURE (Right) Patient reports pain as 7 on 0-10 scale  Patient states he is having some trouble staying ahead of the pain.  He is resting comfortable in bed.  .    Objective: Vital signs in last 24 hours: Temp:  [97.4 F (36.3 C)-98.9 F (37.2 C)] 98.9 F (37.2 C) (06/09 0900) Pulse Rate:  [71-80] 80 (06/09 0900) Resp:  [12-18] 18 (06/09 0900) BP: (95-130)/(49-78) 95/50 (06/09 0900) SpO2:  [95 %-100 %] 95 % (06/09 0900)  Intake/Output from previous day: 06/08 0701 - 06/09 0700 In: 4412.5 [P.O.:360; I.V.:3452.5; IV Piggyback:600] Out: 200 [Blood:200] Intake/Output this shift: No intake/output data recorded.   Recent Labs  10/09/16 1133 10/10/16 0419  HGB 11.8* 10.8*    Recent Labs  10/09/16 1133 10/10/16 0419  WBC 6.9 6.5  RBC 3.91* 3.62*  HCT 36.0* 33.6*  PLT 123* 125*    Recent Labs  10/09/16 1133 10/10/16 0419  NA 138 140  139  K 4.4 4.3  4.2  CL 104 101  100*  CO2 29 31  30   BUN 13 14  14   CREATININE 1.07 1.03  1.01  GLUCOSE 132* 122*  118*  CALCIUM 8.7* 8.6*  8.6*    Recent Labs  10/09/16 1133  INR 1.09    ABD soft Neurovascular intact Sensation intact distally Incision: dressing C/D/I  Assessment/Plan: 1 Day Post-Op Procedure(s) (LRB): INTRAMEDULLARY (IM) NAIL TIBIAL (Right) OPEN REDUCTION INTERNAL FIXATION (ORIF) ANKLE FRACTURE (Right)  Principal Problem:   Fracture of tibial shaft, right, closed Active Problems:   Essential (primary) hypertension   Closed fracture of right tibial plafond with fibula involvement  Called by nurse last night due to pain control   Gave orders for IV Dilaudid q 2 prn and oxy q 3 instead of q 6.  Had long discussion with patient and his wife about quickly weaning off medications.  He did 10 straight leg raises with me    He also requested constipation treatment   I  increased his miralax to BID and added a suppository if necessary  Advance diet Up with therapy  Yue Glasheen J 10/10/2016, 11:17 AM

## 2016-10-10 NOTE — Progress Notes (Addendum)
Physical Therapy Treatment Patient Details Name: Patrick Good MRN: [id number] DOB: 1941/03/20 Today's Date: 10/10/2016    History of Present Illness Patrick Good is an 76 y.o. male with past medical hx of HTN, glaucoma and pre-diabetes and has been working on intentional weight loss and has lost about 30 pounds with diet, who presents to the ED with chief complaint of syncopal episode resulting in right leg injury.  Found to have R tib/fib fracture, underwent surgical fixation 10/09/16.  PMH L ankle fx 1980's, meniscal repair, HTN, hernia repair.     PT Comments    Patient s/p surgical fixation yesterday.  All goals and care plan remain appropriate.  Pt. slowly progressing with mobility.  Patient and wife limited by anxiety and fear of another fall.  Patient moving somewhat better but with limited distance.  Continue to feel that patient would benefit from CIR admission prior to discharge.    Follow Up Recommendations  CIR     Equipment Recommendations  Rolling walker with 5" wheels;3in1 (PT)    Recommendations for Other Services Rehab consult     Precautions / Restrictions Precautions Precautions: Fall Required Braces or Orthoses: Other Brace/Splint Other Brace/Splint: R ankle splint Restrictions Weight Bearing Restrictions: Yes RLE Weight Bearing: Non weight bearing    Mobility  Bed Mobility Overal bed mobility: Needs Assistance Bed Mobility: Supine to Sit     Supine to sit: Min assist     General bed mobility comments: assist for management of LE  Transfers Overall transfer level: Needs assistance Equipment used: Rolling walker (2 wheeled) Transfers: Sit to/from Stand Sit to Stand: Min assist         General transfer comment: Min A for standing balance  Ambulation/Gait Ambulation/Gait assistance: Min assist Ambulation Distance (Feet): 5 Feet Assistive device: Rolling walker (2 wheeled) Gait Pattern/deviations: Step-to pattern Gait velocity:  decreased   General Gait Details: patient unable to ambulate further due to anxiety/fear   Stairs            Wheelchair Mobility    Modified Rankin (Stroke Patients Only)       Balance Overall balance assessment: Needs assistance Sitting-balance support: No upper extremity supported;Feet supported Sitting balance-Leahy Scale: Fair Sitting balance - Comments: donned plants with no LOB in sitting   Standing balance support: Bilateral upper extremity supported;During functional activity Standing balance-Leahy Scale: Poor Standing balance comment: reliant on RW and physical assist for support for balance                            Cognition Arousal/Alertness: Awake/alert Behavior During Therapy: WFL for tasks assessed/performed;Anxious Overall Cognitive Status: Within Functional Limits for tasks assessed                                        Exercises General Exercises - Lower Extremity Quad Sets: AROM;Right;10 reps;Supine    General Comments General comments (skin integrity, edema, etc.): Provided pt, family, and NT with education on DME positioning for safe toilet transfer.       Pertinent Vitals/Pain Pain Assessment: 0-10 Pain Score: 2  Pain Location: right leg Pain Descriptors / Indicators: Aching;Dull Pain Intervention(s): Limited activity within patient's tolerance;Monitored during session;Repositioned    Home Living Family/patient expects to be discharged to:: Private residence Living Arrangements: Spouse/significant other Available Help at Discharge: Family;Available 24 hours/day Type of Home:  House Home Access: Stairs to enter Entrance Stairs-Rails: None Home Layout: Two level;Able to live on main level with bedroom/bathroom Home Equipment: [employer]andra [employer] - single point;Crutches;Hand held shower head      Prior Function Level of Independence: Independent          PT Goals (current goals can now be found in the care plan  section) Acute Rehab PT Goals Patient Stated Goal: To return to independent Progress towards PT goals: Progressing toward goals    Frequency    Min 5X/week      PT Plan Current plan remains appropriate    Co-evaluation              AM-PAC PT "6 Clicks" Daily Activity  Outcome Measure  Difficulty turning over in bed (including adjusting bedclothes, sheets and blankets)?: A Little Difficulty moving from lying on back to sitting on the side of the bed? : Total Difficulty sitting down on and standing up from a chair with arms (e.g., wheelchair, bedside commode, etc,.)?: Total Help needed moving to and from a bed to chair (including a wheelchair)?: A Little Help needed walking in hospital room?: A Little Help needed climbing 3-5 steps with a railing? : Total 6 Click Score: 12    End of Session Equipment Utilized During Treatment: Gait belt Activity Tolerance: Patient tolerated treatment well Patient left: in chair;with call bell/phone within reach;with family/visitor present Nurse Communication: Mobility status PT Visit Diagnosis: Difficulty in walking, not elsewhere classified (R26.2);Pain Pain - Right/Left: Right Pain - part of body: Ankle and joints of foot     Time: 1053-1130 PT Time Calculation (min) (ACUTE ONLY): 37 min  Charges:  $Gait Training: 23-37 mins                    G Codes:       10-24-2016 [place], PT [phone]     Shanna Cisco 24-Oct-2016, 1:33 PM

## 2016-10-11 LAB — RENAL FUNCTION PANEL
ALBUMIN: 2.9 g/dL — AB (ref 3.5–5.0)
ANION GAP: 5 (ref 5–15)
BUN: 24 mg/dL — ABNORMAL HIGH (ref 6–20)
CO2: 29 mmol/L (ref 22–32)
Calcium: 8.4 mg/dL — ABNORMAL LOW (ref 8.9–10.3)
Chloride: 102 mmol/L (ref 101–111)
Creatinine, Ser: 1.24 mg/dL (ref 0.61–1.24)
GFR, EST NON AFRICAN AMERICAN: 56 mL/min — AB (ref >60)
Glucose, Bld: 119 mg/dL — ABNORMAL HIGH (ref 65–99)
PHOSPHORUS: 3.3 mg/dL (ref 2.5–4.6)
Potassium: 4.1 mmol/L (ref 3.5–5.1)
Sodium: 136 mmol/L (ref 135–145)

## 2016-10-11 LAB — CBC
HEMATOCRIT: 30.4 % — AB (ref 39.0–52.0)
HEMOGLOBIN: 9.9 g/dL — AB (ref 13.0–17.0)
MCH: 30.1 pg (ref 26.0–34.0)
MCHC: 32.6 g/dL (ref 30.0–36.0)
MCV: 92.4 fL (ref 78.0–100.0)
Platelets: 120 10*3/uL — ABNORMAL LOW (ref 150–400)
RBC: 3.29 MIL/uL — AB (ref 4.22–5.81)
RDW: 13.4 % (ref 11.5–15.5)
WBC: 6.4 10*3/uL (ref 4.0–10.5)

## 2016-10-11 LAB — BASIC METABOLIC PANEL
ANION GAP: 6 (ref 5–15)
BUN: 24 mg/dL — ABNORMAL HIGH (ref 6–20)
CALCIUM: 8.4 mg/dL — AB (ref 8.9–10.3)
CO2: 29 mmol/L (ref 22–32)
Chloride: 103 mmol/L (ref 101–111)
Creatinine, Ser: 1.23 mg/dL (ref 0.61–1.24)
GFR, EST NON AFRICAN AMERICAN: 56 mL/min — AB (ref >60)
Glucose, Bld: 122 mg/dL — ABNORMAL HIGH (ref 65–99)
POTASSIUM: 4.3 mmol/L (ref 3.5–5.1)
Sodium: 138 mmol/L (ref 135–145)

## 2016-10-11 MED ORDER — VITAMIN D (ERGOCALCIFEROL) 1.25 MG (50000 UNIT) PO CAPS
50000.0000 [IU] | ORAL_CAPSULE | ORAL | Status: DC
  Administered 2016-10-11 – 2016-10-12 (×2): 50000 [IU] via ORAL
  Filled 2016-10-11 (×2): qty 1

## 2016-10-11 NOTE — Progress Notes (Signed)
Physical Therapy Treatment Patient Details Name: Patrick Good MRN: [id number] DOB: 12-22-1940 Today's Date: 10/11/2016    History of Present Illness Patrick Good is an 76 y.o. male with past medical hx of HTN, glaucoma and pre-diabetes and has been working on intentional weight loss and has lost about 30 pounds with diet, who presents to the ED with chief complaint of syncopal episode resulting in right leg injury.  Found to have R tib/fib fracture, underwent surgical fixation 10/09/16.  PMH L ankle fx 1980's, meniscal repair, HTN, hernia repair.     PT Comments    Pt demonstrates improved tolerance for short distance gait with RW and is able to perform 1 step up to mimic going into home. Pt will benefit from additional work on steps and increasing gait distance prior to discharge. Pt may benefit more from HHPT vs CIR at this time due to current functional progress.    Follow Up Recommendations  CIR vs HHPT     Equipment Recommendations  Rolling walker with 5" wheels;3in1 (PT)    Recommendations for Other Services Rehab consult     Precautions / Restrictions Precautions Precautions: Fall Required Braces or Orthoses: Other Brace/Splint Other Brace/Splint: R ankle splint Restrictions Weight Bearing Restrictions: Yes RLE Weight Bearing: Non weight bearing    Mobility  Bed Mobility Overal bed mobility: Needs Assistance Bed Mobility: Supine to Sit     Supine to sit: Supervision;HOB elevated     General bed mobility comments: sitting EOB when PT arrives  Transfers Overall transfer level: Needs assistance Equipment used: Rolling walker (2 wheeled) Transfers: Sit to/from Stand Sit to Stand: Min guard Stand pivot transfers: Min guard       General transfer comment: Min guard for safety  Ambulation/Gait Ambulation/Gait assistance: Min assist Ambulation Distance (Feet): 20 Feet Assistive device: Rolling walker (2 wheeled) Gait Pattern/deviations: Step-to  pattern Gait velocity: decreased Gait velocity interpretation: Below normal speed for age/gender General Gait Details: good sequencing and tolerance for gait with RW   Stairs Stairs: Yes   Stair Management: No rails;Step to pattern;Forwards;With walker Number of Stairs: 1 General stair comments: practice 1 step with RW. Pt able to maintain NWB and perform safely with Min A for safety  Wheelchair Mobility    Modified Rankin (Stroke Patients Only)       Balance Overall balance assessment: Needs assistance Sitting-balance support: No upper extremity supported;Feet supported Sitting balance-Leahy Scale: Good Sitting balance - Comments: donned plants with no LOB in sitting   Standing balance support: Bilateral upper extremity supported;During functional activity Standing balance-Leahy Scale: Poor Standing balance comment: reliant on RW and physical assist for support for balance                            Cognition Arousal/Alertness: Awake/alert Behavior During Therapy: WFL for tasks assessed/performed;Anxious Overall Cognitive Status: Within Functional Limits for tasks assessed                                        Exercises      General Comments        Pertinent Vitals/Pain Pain Assessment: 0-10 Pain Score: 2  Pain Location: right leg Pain Descriptors / Indicators: Aching;Dull Pain Intervention(s): Monitored during session;Premedicated before session;Repositioned    Home Living  Prior Function            PT Goals (current goals can now be found in the care plan section) Acute Rehab PT Goals Patient Stated Goal: To return to independent Progress towards PT goals: Progressing toward goals    Frequency    Min 5X/week      PT Plan Current plan remains appropriate    Co-evaluation              AM-PAC PT "6 Clicks" Daily Activity  Outcome Measure  Difficulty turning over in bed  (including adjusting bedclothes, sheets and blankets)?: A Little Difficulty moving from lying on back to sitting on the side of the bed? : Total Difficulty sitting down on and standing up from a chair with arms (e.g., wheelchair, bedside commode, etc,.)?: Total Help needed moving to and from a bed to chair (including a wheelchair)?: A Little Help needed walking in hospital room?: A Little Help needed climbing 3-5 steps with a railing? : A Little 6 Click Score: 14    End of Session Equipment Utilized During Treatment: Gait belt Activity Tolerance: Patient tolerated treatment well Patient left: in chair;with call bell/phone within reach;with family/visitor present Nurse Communication: Mobility status PT Visit Diagnosis: Difficulty in walking, not elsewhere classified (R26.2);Pain Pain - Right/Left: Right Pain - part of body: Ankle and joints of foot     Time: 7858-8502 PT Time Calculation (min) (ACUTE ONLY): 25 min  Charges:  $Gait Training: 23-37 mins                    G Codes:       Scheryl Marten PT, DPT  [phone]    Jacqulyn Liner Sloan Leiter 10/11/2016, 4:24 PM

## 2016-10-11 NOTE — Progress Notes (Addendum)
Subjective: 2 Days Post-Op Procedure(s) (LRB): INTRAMEDULLARY (IM) NAIL TIBIAL (Right) OPEN REDUCTION INTERNAL FIXATION (ORIF) ANKLE FRACTURE (Right) Patient reports pain as 3 on 0-10 scale.    Objective: Vital signs in last 24 hours: Temp:  [98.4 F (36.9 C)-99.8 F (37.7 C)] 99.2 F (37.3 C) (06/10 0417) Pulse Rate:  [77-93] 77 (06/10 0417) Resp:  [16-18] 16 (06/10 0417) BP: (95-115)/(50-63) 111/54 (06/10 0417) SpO2:  [92 %-95 %] 94 % (06/10 0417)  Intake/Output from previous day: 06/09 0701 - 06/10 0700 In: 600 [P.O.:600] Out: 775 [Urine:775] Intake/Output this shift: No intake/output data recorded.   Recent Labs  10/09/16 1133 10/10/16 0419 10/11/16 0236  HGB 11.8* 10.8* 9.9*    Recent Labs  10/10/16 0419 10/11/16 0236  WBC 6.5 6.4  RBC 3.62* 3.29*  HCT 33.6* 30.4*  PLT 125* 120*    Recent Labs  10/10/16 0419 10/11/16 0236  NA 140  139 136  138  K 4.3  4.2 4.1  4.3  CL 101  100* 102  103  CO2 31  30 29  29   BUN 14  14 24*  24*  CREATININE 1.03  1.01 1.24  1.23  GLUCOSE 122*  118* 119*  122*  CALCIUM 8.6*  8.6* 8.4*  8.4*    Recent Labs  10/09/16 1133  INR 1.09    ABD soft Neurovascular intact Sensation intact distally Intact pulses distally Incision: dressing C/D/I and no drainage  Assessment/Plan: 2 Days Post-Op Procedure(s) (LRB): INTRAMEDULLARY (IM) NAIL TIBIAL (Right) OPEN REDUCTION INTERNAL FIXATION (ORIF) ANKLE FRACTURE (Right)  Principal Problem:   Fracture of tibial shaft, right, closed Active Problems:   Essential (primary) hypertension   Closed fracture of right tibial plafond with fibula involvement   Renal insufficiency   Tibia/fibula fracture   Post-operative pain   Constipation due to pain medication   Orthostasis   Arterial hypotension   AKI (acute kidney injury) ([place])   Syncope   Acute blood loss anemia   Prediabetes   Advance diet Up with therapy Plan for discharge tomorrow  Waiting to see  if he gets into inpatient rehab tomorrow  Linda Hedges 10/11/2016, 8:29 AM

## 2016-10-11 NOTE — Progress Notes (Signed)
Occupational Therapy Treatment Patient Details Name: Patrick Good MRN: [id number] DOB: 03/30/1941 Today's Date: 10/11/2016    History of present illness Patrick Good is an 76 y.o. male with past medical hx of HTN, glaucoma and pre-diabetes and has been working on intentional weight loss and has lost about 30 pounds with diet, who presents to the ED with chief complaint of syncopal episode resulting in right leg injury.  Found to have R tib/fib fracture, underwent surgical fixation 10/09/16.  PMH L ankle fx 1980's, meniscal repair, HTN, hernia repair.    OT comments  Pt progressing towards established goals. Provided education on walk-in shower transfer with 3N1. Pt demonstrated understanding and performed transfer with Min guard A and VCs for safety. Answered pt and family questions about dc plan, car transfers, and home set up. Continue to recommend CIR feeling be would benefit and could manage intense therapy to optimize independence prior to dc home. Will continue to follow acutely to facilitate safe dc.    Follow Up Recommendations  CIR    Equipment Recommendations  Other (comment) (Pending pt progress)    Recommendations for Other Services PT consult;Rehab consult    Precautions / Restrictions Precautions Precautions: Fall Required Braces or Orthoses: Other Brace/Splint Other Brace/Splint: R ankle splint Restrictions Weight Bearing Restrictions: Yes RLE Weight Bearing: Non weight bearing       Mobility Bed Mobility Overal bed mobility: Needs Assistance Bed Mobility: Supine to Sit     Supine to sit: Supervision;HOB elevated     General bed mobility comments: assist for management of LE  Transfers Overall transfer level: Needs assistance Equipment used: Rolling walker (2 wheeled) Transfers: Sit to/from Stand Sit to Stand: Min assist Stand pivot transfers: Min assist       General transfer comment: Min A for standing balance    Balance Overall balance  assessment: Needs assistance Sitting-balance support: No upper extremity supported;Feet supported Sitting balance-Leahy Scale: Fair Sitting balance - Comments: donned plants with no LOB in sitting   Standing balance support: Bilateral upper extremity supported;During functional activity Standing balance-Leahy Scale: Poor Standing balance comment: reliant on RW and physical assist for support for balance                           ADL either performed or assessed with clinical judgement   ADL Overall ADL's : Needs assistance/impaired                       Lower Body Dressing Details (indicate cue type and reason): Reviewed dressing technique   Toilet Transfer Details (indicate cue type and reason): Educated on 3N1 set up at home     Tub/ Shower Transfer: Walk-in shower;3 in 1;Ambulation;Rolling walker;Min guard Tub/Shower Transfer Details (indicate cue type and reason): Min guard for safety. Discussed different positioning for the 3N1 to accomidate home set up Functional mobility during ADLs: Rolling walker;Min guard General ADL Comments: Demonstrating progress. Discussed with pt dc options and car transfer. Pt adhered to WB status throughout session.      Vision       Perception     Praxis      Cognition Arousal/Alertness: Awake/alert Behavior During Therapy: WFL for tasks assessed/performed;Anxious Overall Cognitive Status: Within Functional Limits for tasks assessed  Exercises     Shoulder Instructions       General Comments      Pertinent Vitals/ Pain       Pain Assessment: 0-10 Pain Score: 2  Pain Location: right leg Pain Descriptors / Indicators: Aching;Dull Pain Intervention(s): Monitored during session  Home Living                                          Prior Functioning/Environment              Frequency  Min 3X/week        Progress Toward  Goals  OT Goals(current goals can now be found in the care plan section)  Progress towards OT goals: Progressing toward goals  Acute Rehab OT Goals Patient Stated Goal: To return to independent OT Goal Formulation: With patient Time For Goal Achievement: 10/24/16 Potential to Achieve Goals: Good ADL Goals Pt Will Perform Grooming: with set-up;with supervision;standing Pt Will Perform Lower Body Bathing: with caregiver independent in assisting;with adaptive equipment;sit to/from stand;with min guard assist Pt Will Perform Lower Body Dressing: with min guard assist;with caregiver independent in assisting;with adaptive equipment;sit to/from stand Pt Will Transfer to Toilet: with set-up;with supervision;bedside commode;ambulating Pt Will Perform Toileting - Clothing Manipulation and hygiene: with set-up;with supervision;sit to/from stand Pt Will Perform Tub/Shower Transfer: Shower transfer;ambulating;rolling walker;with min guard assist;3 in 1  Plan Discharge plan remains appropriate    Co-evaluation                 AM-PAC PT "6 Clicks" Daily Activity     Outcome Measure   Help from another person eating meals?: None Help from another person taking care of personal grooming?: A Little Help from another person toileting, which includes using toliet, bedpan, or urinal?: A Little Help from another person bathing (including washing, rinsing, drying)?: A Lot Help from another person to put on and taking off regular upper body clothing?: A Lot Help from another person to put on and taking off regular lower body clothing?: None 6 Click Score: 18    End of Session Equipment Utilized During Treatment: Gait belt;Rolling walker  OT Visit Diagnosis: Unsteadiness on feet (R26.81);Other abnormalities of gait and mobility (R26.89);Muscle weakness (generalized) (M62.81);Pain;History of falling (Z91.81) Pain - Right/Left: Right Pain - part of body: Leg   Activity Tolerance Patient tolerated  treatment well   Patient Left with call bell/phone within reach;with family/visitor present;Other (comment) (EOB waiting for PT)   Nurse Communication Mobility status;Weight bearing status;Precautions        Time: 1434-1500 OT Time Calculation (min): 26 min  Charges: OT General Charges $OT Visit: 1 Procedure OT Treatments $Self Care/Home Management : 23-37 mins  [place], OTR/L Acute Rehab Pager: [phone] Office: [place] 10/11/2016, 3:14 PM

## 2016-10-12 ENCOUNTER — Encounter (HOSPITAL_COMMUNITY): Payer: Self-pay

## 2016-10-12 ENCOUNTER — Encounter (HOSPITAL_COMMUNITY): Payer: Self-pay | Admitting: Orthopedic Surgery

## 2016-10-12 ENCOUNTER — Inpatient Hospital Stay (HOSPITAL_COMMUNITY)
Admission: RE | Admit: 2016-10-12 | Discharge: 2016-10-17 | DRG: 560 | Disposition: A | Discharge status: Home Health | Payer: Medicare Other | Source: Intra-hospital | Attending: Physical Medicine & Rehabilitation | Admitting: Physical Medicine & Rehabilitation

## 2016-10-12 DIAGNOSIS — S82839A Other fracture of upper and lower end of unspecified fibula, initial encounter for closed fracture: Secondary | ICD-10-CM | POA: Insufficient documentation

## 2016-10-12 DIAGNOSIS — D696 Thrombocytopenia, unspecified: Secondary | ICD-10-CM | POA: Diagnosis present

## 2016-10-12 DIAGNOSIS — W19XXXA Unspecified fall, initial encounter: Secondary | ICD-10-CM

## 2016-10-12 DIAGNOSIS — S82201D Unspecified fracture of shaft of right tibia, subsequent encounter for closed fracture with routine healing: Secondary | ICD-10-CM | POA: Diagnosis not present

## 2016-10-12 DIAGNOSIS — E860 Dehydration: Secondary | ICD-10-CM | POA: Diagnosis present

## 2016-10-12 DIAGNOSIS — S82401S Unspecified fracture of shaft of right fibula, sequela: Secondary | ICD-10-CM

## 2016-10-12 DIAGNOSIS — D62 Acute posthemorrhagic anemia: Secondary | ICD-10-CM | POA: Diagnosis present

## 2016-10-12 DIAGNOSIS — E559 Vitamin D deficiency, unspecified: Secondary | ICD-10-CM | POA: Diagnosis present

## 2016-10-12 DIAGNOSIS — R269 Unspecified abnormalities of gait and mobility: Secondary | ICD-10-CM | POA: Diagnosis not present

## 2016-10-12 DIAGNOSIS — Z7982 Long term (current) use of aspirin: Secondary | ICD-10-CM

## 2016-10-12 DIAGNOSIS — E46 Unspecified protein-calorie malnutrition: Secondary | ICD-10-CM | POA: Diagnosis present

## 2016-10-12 DIAGNOSIS — I951 Orthostatic hypotension: Secondary | ICD-10-CM | POA: Diagnosis not present

## 2016-10-12 DIAGNOSIS — R799 Abnormal finding of blood chemistry, unspecified: Secondary | ICD-10-CM | POA: Diagnosis not present

## 2016-10-12 DIAGNOSIS — I10 Essential (primary) hypertension: Secondary | ICD-10-CM | POA: Diagnosis present

## 2016-10-12 DIAGNOSIS — Z6828 Body mass index (BMI) 28.0-28.9, adult: Secondary | ICD-10-CM

## 2016-10-12 DIAGNOSIS — S82831D Other fracture of upper and lower end of right fibula, subsequent encounter for closed fracture with routine healing: Secondary | ICD-10-CM | POA: Diagnosis not present

## 2016-10-12 DIAGNOSIS — S82871A Displaced pilon fracture of right tibia, initial encounter for closed fracture: Secondary | ICD-10-CM

## 2016-10-12 DIAGNOSIS — S82101D Unspecified fracture of upper end of right tibia, subsequent encounter for closed fracture with routine healing: Secondary | ICD-10-CM | POA: Diagnosis not present

## 2016-10-12 DIAGNOSIS — K5900 Constipation, unspecified: Secondary | ICD-10-CM | POA: Diagnosis present

## 2016-10-12 DIAGNOSIS — S82251S Displaced comminuted fracture of shaft of right tibia, sequela: Secondary | ICD-10-CM

## 2016-10-12 DIAGNOSIS — S82871S Displaced pilon fracture of right tibia, sequela: Secondary | ICD-10-CM | POA: Diagnosis not present

## 2016-10-12 DIAGNOSIS — S82871D Displaced pilon fracture of right tibia, subsequent encounter for closed fracture with routine healing: Secondary | ICD-10-CM

## 2016-10-12 DIAGNOSIS — N179 Acute kidney failure, unspecified: Secondary | ICD-10-CM | POA: Diagnosis not present

## 2016-10-12 DIAGNOSIS — Z87891 Personal history of nicotine dependence: Secondary | ICD-10-CM

## 2016-10-12 DIAGNOSIS — S82831A Other fracture of upper and lower end of right fibula, initial encounter for closed fracture: Secondary | ICD-10-CM | POA: Diagnosis present

## 2016-10-12 DIAGNOSIS — R7303 Prediabetes: Secondary | ICD-10-CM | POA: Diagnosis not present

## 2016-10-12 DIAGNOSIS — Z79899 Other long term (current) drug therapy: Secondary | ICD-10-CM | POA: Diagnosis not present

## 2016-10-12 DIAGNOSIS — S82201S Unspecified fracture of shaft of right tibia, sequela: Secondary | ICD-10-CM

## 2016-10-12 DIAGNOSIS — S82831S Other fracture of upper and lower end of right fibula, sequela: Secondary | ICD-10-CM | POA: Diagnosis not present

## 2016-10-12 HISTORY — DX: Vitamin D deficiency, unspecified: E55.9

## 2016-10-12 LAB — CBC
HCT: 28.9 % — ABNORMAL LOW (ref 39.0–52.0)
HEMOGLOBIN: 9.6 g/dL — AB (ref 13.0–17.0)
MCH: 30.7 pg (ref 26.0–34.0)
MCHC: 33.2 g/dL (ref 30.0–36.0)
MCV: 92.3 fL (ref 78.0–100.0)
PLATELETS: 134 10*3/uL — AB (ref 150–400)
RBC: 3.13 MIL/uL — ABNORMAL LOW (ref 4.22–5.81)
RDW: 13.5 % (ref 11.5–15.5)
WBC: 6 10*3/uL (ref 4.0–10.5)

## 2016-10-12 LAB — RENAL FUNCTION PANEL
ALBUMIN: 2.7 g/dL — AB (ref 3.5–5.0)
ANION GAP: 6 (ref 5–15)
BUN: 23 mg/dL — AB (ref 6–20)
CO2: 28 mmol/L (ref 22–32)
CREATININE: 1.03 mg/dL (ref 0.61–1.24)
Calcium: 8.2 mg/dL — ABNORMAL LOW (ref 8.9–10.3)
Chloride: 106 mmol/L (ref 101–111)
GFR calc Af Amer: >60 mL/min (ref >60)
GFR calc non Af Amer: >60 mL/min (ref >60)
GLUCOSE: 114 mg/dL — AB (ref 65–99)
PHOSPHORUS: 2.5 mg/dL (ref 2.5–4.6)
Potassium: 3.8 mmol/L (ref 3.5–5.1)
Sodium: 140 mmol/L (ref 135–145)

## 2016-10-12 MED ORDER — TRAZODONE HCL 50 MG PO TABS: 25.0000 mg | ORAL_TABLET | Freq: Every evening | ORAL | Status: DC | PRN

## 2016-10-12 MED ORDER — POLYETHYLENE GLYCOL 3350 17 G PO PACK
17.0000 g | PACK | Freq: Every day | ORAL | Status: DC
  Administered 2016-10-12 – 2016-10-16 (×5): 17 g via ORAL
  Filled 2016-10-12 (×5): qty 1

## 2016-10-12 MED ORDER — LATANOPROST 0.005 % OP SOLN
1.0000 [drp] | Freq: Every day | OPHTHALMIC | Status: DC
  Administered 2016-10-12 – 2016-10-16 (×5): 1 [drp] via OPHTHALMIC
  Filled 2016-10-12 (×2): qty 2.5

## 2016-10-12 MED ORDER — PROCHLORPERAZINE MALEATE 5 MG PO TABS: 5.0000 mg | ORAL_TABLET | Freq: Four times a day (QID) | ORAL | Status: DC | PRN

## 2016-10-12 MED ORDER — FLEET ENEMA 7-19 GM/118ML RE ENEM: 1.0000 | ENEMA | Freq: Once | RECTAL | Status: DC | PRN

## 2016-10-12 MED ORDER — BISACODYL 10 MG RE SUPP: 10.0000 mg | Freq: Every day | RECTAL | Status: DC | PRN

## 2016-10-12 MED ORDER — POLYETHYLENE GLYCOL 3350 17 G PO PACK: 17.0000 g | PACK | Freq: Every day | ORAL | Status: DC | PRN

## 2016-10-12 MED ORDER — PROCHLORPERAZINE EDISYLATE 5 MG/ML IJ SOLN: 5.0000 mg | Freq: Four times a day (QID) | INTRAMUSCULAR | Status: DC | PRN

## 2016-10-12 MED ORDER — GUAIFENESIN-DM 100-10 MG/5ML PO SYRP
5.0000 mL | ORAL_SOLUTION | Freq: Four times a day (QID) | ORAL | Status: DC | PRN
  Administered 2016-10-16: 5 mL via ORAL
  Filled 2016-10-12: qty 5

## 2016-10-12 MED ORDER — OXYCODONE HCL 5 MG PO TABS
5.0000 mg | ORAL_TABLET | ORAL | Status: DC | PRN
  Administered 2016-10-13 – 2016-10-16 (×3): 5 mg via ORAL
  Filled 2016-10-12 (×5): qty 1

## 2016-10-12 MED ORDER — ACETAMINOPHEN 325 MG PO TABS
325.0000 mg | ORAL_TABLET | ORAL | Status: DC | PRN
  Administered 2016-10-13 – 2016-10-17 (×5): 650 mg via ORAL
  Filled 2016-10-12 (×5): qty 2

## 2016-10-12 MED ORDER — PROCHLORPERAZINE 25 MG RE SUPP: 12.5000 mg | Freq: Four times a day (QID) | RECTAL | Status: DC | PRN

## 2016-10-12 MED ORDER — POLYETHYLENE GLYCOL 3350 17 G PO PACK
17.0000 g | PACK | Freq: Two times a day (BID) | ORAL | Status: DC
  Administered 2016-10-13 – 2016-10-16 (×6): 17 g via ORAL
  Filled 2016-10-12 (×8): qty 1

## 2016-10-12 MED ORDER — DIPHENHYDRAMINE HCL 12.5 MG/5ML PO ELIX: 12.5000 mg | ORAL_SOLUTION | Freq: Four times a day (QID) | ORAL | Status: DC | PRN

## 2016-10-12 MED ORDER — ENOXAPARIN SODIUM 40 MG/0.4ML ~~LOC~~ SOLN
40.0000 mg | SUBCUTANEOUS | Status: DC
  Administered 2016-10-13 – 2016-10-16 (×4): 40 mg via SUBCUTANEOUS
  Filled 2016-10-12 (×5): qty 0.4

## 2016-10-12 MED ORDER — TRAMADOL HCL 50 MG PO TABS
50.0000 mg | ORAL_TABLET | Freq: Four times a day (QID) | ORAL | Status: DC | PRN
  Administered 2016-10-14 – 2016-10-17 (×4): 50 mg via ORAL
  Filled 2016-10-12 (×4): qty 1

## 2016-10-12 MED ORDER — CYCLOBENZAPRINE HCL 5 MG PO TABS
5.0000 mg | ORAL_TABLET | Freq: Three times a day (TID) | ORAL | Status: DC | PRN
  Administered 2016-10-15 – 2016-10-16 (×3): 5 mg via ORAL
  Filled 2016-10-12 (×3): qty 1

## 2016-10-12 MED ORDER — VITAMIN D 1000 UNITS PO TABS
2000.0000 [IU] | ORAL_TABLET | Freq: Every day | ORAL | Status: DC
  Administered 2016-10-12: 2000 [IU] via ORAL
  Filled 2016-10-12: qty 2

## 2016-10-12 MED ORDER — ADULT MULTIVITAMIN W/MINERALS CH
1.0000 | ORAL_TABLET | Freq: Every day | ORAL | Status: DC
  Administered 2016-10-13 – 2016-10-17 (×5): 1 via ORAL
  Filled 2016-10-12 (×5): qty 1

## 2016-10-12 MED ORDER — GLUCERNA SHAKE PO LIQD
237.0000 mL | Freq: Three times a day (TID) | ORAL | Status: DC
  Administered 2016-10-12 – 2016-10-17 (×11): 237 mL via ORAL

## 2016-10-12 MED ORDER — VITAMIN D 1000 UNITS PO TABS
2000.0000 [IU] | ORAL_TABLET | Freq: Every day | ORAL | Status: DC
  Administered 2016-10-13 – 2016-10-17 (×5): 2000 [IU] via ORAL
  Filled 2016-10-12 (×5): qty 2

## 2016-10-12 MED ORDER — ALUM & MAG HYDROXIDE-SIMETH 200-200-20 MG/5ML PO SUSP: 30.0000 mL | ORAL | Status: DC | PRN

## 2016-10-12 NOTE — Progress Notes (Signed)
Orthopedic Trauma Service Progress Note    Subjective:  Doing better Pain improved No specific concerns or complaints Would like to go to CIR    Review of Systems  Constitutional: Negative for chills and fever.  Respiratory: Negative for shortness of breath and wheezing.   Cardiovascular: Negative for chest pain and palpitations.  Gastrointestinal: Negative for abdominal pain, nausea and vomiting.  Neurological: Negative for tingling and sensory change.    Objective:   VITALS:   Vitals:   10/11/16 1016 10/11/16 1840 10/11/16 2006 10/12/16 0500  BP: (!) 98/49 103/60 113/61 109/64  Pulse: 79 80 74 72  Resp: 17 17 17 18   Temp: 98.7 F (37.1 C) 98.9 F (37.2 C) 97.9 F (36.6 C) 98.2 F (36.8 C)  TempSrc: Oral Oral Oral Oral  SpO2: 96% 98% 98% 97%  Weight:      Height:        Intake/Output      06/10 0701 - 06/11 0700 06/11 0701 - 06/12 0700   P.O.  240   Total Intake(mL/kg)  240 (2.1)   Urine (mL/kg/hr) 1100 (0.4)    Stool     Total Output 1100     Net -1100 +240        Urine Occurrence 2 x      LABS  Results for orders placed or performed during the hospital encounter of 10/06/16 (from the past 24 hour(s))  Renal function panel     Status: Abnormal   Collection Time: 10/12/16  2:34 AM  Result Value Ref Range   Sodium 140 135 - 145 mmol/L   Potassium 3.8 3.5 - 5.1 mmol/L   Chloride 106 101 - 111 mmol/L   CO2 28 22 - 32 mmol/L   Glucose, Bld 114 (H) 65 - 99 mg/dL   BUN 23 (H) 6 - 20 mg/dL   Creatinine, Ser 1.03 0.61 - 1.24 mg/dL   Calcium 8.2 (L) 8.9 - 10.3 mg/dL   Phosphorus 2.5 2.5 - 4.6 mg/dL   Albumin 2.7 (L) 3.5 - 5.0 g/dL   GFR calc non Af Amer >60 >60 mL/min   GFR calc Af Amer >60 >60 mL/min   Anion gap 6 5 - 15  CBC     Status: Abnormal   Collection Time: 10/12/16  2:34 AM  Result Value Ref Range   WBC 6.0 4.0 - 10.5 K/uL   RBC 3.13 (L) 4.22 - 5.81 MIL/uL   Hemoglobin 9.6 (L) 13.0 - 17.0 g/dL   HCT 28.9 (L) 39.0 -  52.0 %   MCV 92.3 78.0 - 100.0 fL   MCH 30.7 26.0 - 34.0 pg   MCHC 33.2 30.0 - 36.0 g/dL   RDW 13.5 11.5 - 15.5 %   Platelets 134 (L) 150 - 400 K/uL   Results for MAIKOL, GRASSIA (MRN [id number]) as of 10/12/2016 09:42  Ref. Range 10/09/2016 11:33  Vitamin D, 25-Hydroxy Latest Ref Range: 30.0 - 100.0 ng/mL 27.6 (L)    PHYSICAL EXAM:   Gen: sitting on EOB shaving, NAD, appears well Lungs: breathing unlabored Cardiac: regular  Ext:       Right Lower Extremity   Splint c/d/i  Ext warm   Brisk cap refill  Swelling well controlled  EHL, FHL, lesser toe motor intact  DPN, SPN, TN sensation intact   No pain with passive stretch   Assessment/Plan: 3 Days Post-Op   Principal Problem:   Fracture of tibial shaft, right, closed Active Problems:   Essential (primary)  hypertension   Closed fracture of right tibial plafond with fibula involvement   Renal insufficiency   Tibia/fibula fracture   Post-operative pain   Constipation due to pain medication   Orthostasis   Arterial hypotension   AKI (acute kidney injury) ([place])   Syncope   Acute blood loss anemia   Prediabetes   Anti-infectives    Start     Dose/Rate Route Frequency Ordered Stop   10/09/16 1430  ceFAZolin (ANCEF) IVPB 1 g/50 mL premix     1 g 100 mL/hr over 30 Minutes Intravenous Every 6 hours 10/09/16 1247 10/09/16 2023   10/09/16 1245  ceFAZolin (ANCEF) IVPB 1 g/50 mL premix  Status:  Discontinued     1 g 100 mL/hr over 30 Minutes Intravenous Every 6 hours 10/09/16 1243 10/09/16 1247   10/09/16 0745  ceFAZolin (ANCEF) IVPB 2g/100 mL premix     2 g 200 mL/hr over 30 Minutes Intravenous To Short Stay 10/08/16 1144 10/09/16 0835    .  POD/HD#: 94  76 year old white male fall with right tibial shaft and right tibial plafond fracture   - Fall             Likely due to orthostasis   -Right tibial shaft and right tibial plafond fracture s/p IMN and ORIF  NWB x 8 weeks  Splint x 2 weeks then transition to CAM  and begin ankle ROM  Unrestricted ROM R knee  Ice and elevate  Toe motion as tolerated   CIR eval and hopeful admission today               - Right knee internal derangement: Medial and lateral meniscal tears, partial tear LCL, degenerative changes medial and lateral compartments  Non-op management   No knee pain other than that associated with IMN   Pt had no knee pain pre-op   Symptomatic management    - Pain management:           continue with current regimen     - ABL anemia/Hemodynamics             stable                Blood pressure and heart rate stable   - Medical issues              Orthostasis                         Hypertensive medications are being held                         Working assumption is that concomitant use of his antihypertensive medications along with a 30+ weight loss over the last several months had precipitated orthostatic event                         Patient's blood pressures have been fantastic during his hospital stay    - DVT/PE prophylaxis:             Lovenox x 4 weeks  - ID:              Perioperative antibiotics completed    - Metabolic Bone Disease:             Vitamin D insufficiency    Supplement with vitamin d 3 2000 IU daily    - Activity:  Up with assistance             Nonweightbearing right leg   - FEN/GI prophylaxis/Foley/Lines:             Diet as tolerated                - Dispo:             hopeful for dc to CIR this afternoon      Jari Pigg, PA-C [facility] [phone] (O) 10/12/2016, 9:41 AM

## 2016-10-12 NOTE — Progress Notes (Signed)
Patrick Arn, MD Physician Signed Physical Medicine and Rehabilitation  Consult Note Date of Service: 10/08/2016 11:48 AM  Related encounter: ED to Hosp-Admission (Current) from 10/06/2016 in [place] All Collapse All   [] Hide copied text [] Hover for attribution information      Physical Medicine and Rehabilitation Consult   Reason for Consult: right tibial shaft fracture and knee effusion.  Referring Physician: Dr. Marcelino Good   HPI: Patrick Good is a 76 y.o. male with history of HTN, prediabetes who sustained a fall due to episode of dizziness followed by brief syncopal episode while mowing his lawn. History taken from chart review, patient, and wife. 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 He felt a snap of his ankle and sustained complex right tibial fracture with intraarticular extension. Hospitalist following for management of dehydration due to acute on chronic renal insufficiency and issues with hypotension. Syncope felt to be due to orthostasis. Dr. Marcelino Good consulted and right knee effusion aspirated with plans for surgery. IM nail in right tibia with ORIF of ankle performed 10/09/16.  Hospital course complicated by pain, constipation.   Review of Systems  Constitutional: Negative for chills and fever.  Eyes: Negative for blurred vision and double vision.  Musculoskeletal: Positive for falls and joint pain.  Neurological: Positive for weakness. Negative for sensory change and speech change.  All other systems reviewed and are negative.         Past Medical History:  Diagnosis Date  . History of ankle fracture 1982   spiral break; left ankle  . History of chicken pox   . History of measles   . Hypertension     Past Surgical History:  Procedure Laterality Date  . HERNIA REPAIR  1946 and 1952  . KNEE SURGERY  12/2011   Left meniscus by Dr. Wylene Good in [place]  . Myocardial Perfusion Scan  07/29/2011   Normal  . VASECTOMY  1972         Family History  Problem Relation Age of Onset  . Heart Problems Sister   . Diabetes type II Sister   . Breast cancer Sister   . Multiple sclerosis Sister      Social History:  reports that he quit smoking about 24 years ago. His smoking use included Cigarettes. He has a 12.50 pack-year smoking history. He has never used smokeless tobacco. He reports that he does not drink alcohol or use drugs.    Allergies: No Known Allergies          Medications Prior to Admission  Medication Sig Dispense Refill  . aspirin 81 MG tablet Take 1 tablet by mouth daily.    Marland Kitchen ibuprofen (ADVIL,MOTRIN) 200 MG tablet Take 400 mg by mouth as needed for headache.    . latanoprost (XALATAN) 0.005 % ophthalmic solution Place 1 drop into both eyes at bedtime.     Marland Kitchen lisinopril-hydrochlorothiazide (PRINZIDE,ZESTORETIC) 10-12.5 MG tablet TAKE 1 TABLET DAILY 90 tablet 4  . Multiple Vitamins-Minerals (MULTIVITAMIN ADULT PO) Take 1 tablet by mouth daily.    . Multiple Vitamins-Minerals (VISION VITAMINS PO) Take by mouth.    . nystatin cream (MYCOSTATIN) Apply 1 application topically 2 (two) times daily. (Patient taking differently: Apply 1 application topically as needed (rash). ) 15 g 0  . triamcinolone ointment (KENALOG) 0.5 % Apply 1 application topically 2 (two) times daily. To rash on  forearm 15 g 0    Home: Home Living Family/patient expects to be discharged to:: Private residence Living Arrangements: Spouse/significant other Available Help at Discharge: Family, Available 24 hours/day (wife can't help) Type of Home: House Home Access: Stairs to enter [employer] of Steps: 2 (6") Entrance Stairs-Rails: None Home Layout: Two level, Able to live on main level with bedroom/bathroom Bathroom Shower/Tub: Multimedia programmer: Handicapped height Home Equipment:  [place] - single point, Crutches  Functional History: Prior Function Level of Independence: Independent Functional Status:  Mobility: Bed Mobility Overal bed mobility: Needs Assistance Bed Mobility: Supine to Sit Supine to sit: HOB elevated, Min assist General bed mobility comments: for R LE Transfers Overall transfer level: Needs assistance Equipment used: Rolling walker (2 wheeled) Transfers: Sit to/from Stand, [employer] Transfers Sit to Stand: Min assist Stand pivot transfers: Min assist General transfer comment: cues for hand placement, assist for balance, able to maintain NWB in standing (not in sitting); assist for balance to pivot on L foot to recliner with RW Ambulation/Gait General Gait Details: NT due to pain, anxiety  ADL:  Cognition: Cognition Overall Cognitive Status: Within Functional Limits for tasks assessed Cognition Arousal/Alertness: Awake/alert Behavior During Therapy: WFL for tasks assessed/performed Overall Cognitive Status: Within Functional Limits for tasks assessed  Blood pressure 120/66, pulse 72, temperature 98.8 F (37.1 C), temperature source Oral, resp. rate 18, height 6\' 3"  (1.905 m), weight 114.8 kg (253 lb), SpO2 99 %. Physical Exam  Vitals reviewed. Constitutional: He is oriented to person, place, and time. He appears well-developed and well-nourished.  HENT:  Head: Normocephalic and atraumatic.  Eyes: EOM are normal. Right eye exhibits no discharge. Left eye exhibits no discharge.  Neck: Normal range of motion. Neck supple.  Cardiovascular: Normal rate and regular rhythm.   Respiratory: Effort normal and breath sounds normal.  GI: Soft. Bowel sounds are normal.  Musculoskeletal: He exhibits edema and tenderness.  Neurological: He is alert and oriented to person, place, and time.  Motor: B/l UE 5/5 proximal to distal LLE: 4+/5 proximal to distal RLE: HF 2/5, knee and ankle wrapped, wiggles toes Sensation intact to light touch  Skin:  Skin is warm and dry.  RLE dressed  Psychiatric: He has a normal mood and affect. His behavior is normal. Thought content normal.    Lab Results Last 24 Hours       Results for orders placed or performed during the hospital encounter of 10/06/16 (from the past 24 hour(s))  Glucose, capillary     Status: None   Collection Time: 10/07/16  5:17 PM  Result Value Ref Range   Glucose-Capillary 94 65 - 99 mg/dL   Comment 1 Notify RN    Comment 2 Document in Chart   Glucose, capillary     Status: Abnormal   Collection Time: 10/07/16  9:35 PM  Result Value Ref Range   Glucose-Capillary 121 (H) 65 - 99 mg/dL  Renal function panel     Status: Abnormal   Collection Time: 10/08/16  5:25 AM  Result Value Ref Range   Sodium 137 135 - 145 mmol/L   Potassium 4.3 3.5 - 5.1 mmol/L   Chloride 102 101 - 111 mmol/L   CO2 27 22 - 32 mmol/L   Glucose, Bld 114 (H) 65 - 99 mg/dL   BUN 18 6 - 20 mg/dL   Creatinine, Ser 1.06 0.61 - 1.24 mg/dL   Calcium 8.6 (L) 8.9 - 10.3 mg/dL   Phosphorus 2.6 2.5 - 4.6  mg/dL   Albumin 3.4 (L) 3.5 - 5.0 g/dL   GFR calc non Af Amer >60 >60 mL/min   GFR calc Af Amer >60 >60 mL/min   Anion gap 8 5 - 15  Glucose, capillary     Status: Abnormal   Collection Time: 10/08/16  6:18 AM  Result Value Ref Range   Glucose-Capillary 112 (H) 65 - 99 mg/dL      Imaging Results (Last 48 hours)  Dg Chest 1 View  Result Date: 10/06/2016 CLINICAL DATA:  Leg bathroom EXAM: CHEST 1 VIEW COMPARISON:  None. FINDINGS: No acute consolidation or effusion. Normal cardiomediastinal silhouette with atherosclerosis. No pneumothorax. IMPRESSION: No active disease. Electronically Signed   By: Donavan Foil M.D.   On: 10/06/2016 18:44   Dg Tibia/fibula Right  Result Date: 10/06/2016 CLINICAL DATA:  Lower leg fracture EXAM: RIGHT TIBIA AND FIBULA - 2 VIEW COMPARISON:  None. FINDINGS: Comminuted fracture involving the distal shaft of the tibia with fracture lucencies  extending to the metaphysis of the distal tibia. Approximately 1/2 shaft diameter of lateral displacement of distal fracture fragments and about 1 cm of separation of bony fragments. Additional comminuted fracture involving the proximal metaphysis and shaft of the fibula with a bowel 1/4 shaft diameter of anterior displacement of distal fracture fragment. Degenerative changes at the right knee. IMPRESSION: 1. Comminuted displaced and slightly separated distal tibial fracture 2. Comminuted and mildly displaced proximal fibular fracture Electronically Signed   By: Donavan Foil M.D.   On: 10/06/2016 18:46   Ct Tibia Fibula Right Wo Contrast  Result Date: 10/06/2016 CLINICAL DATA:  Patient fell at home today.  Right leg pain. EXAM: CT OF THE LOWER RIGHT EXTREMITY WITHOUT CONTRAST TECHNIQUE: Multidetector CT imaging of the right lower extremity was performed according to the standard protocol. COMPARISON:  Radiographs from earlier on the same day. FINDINGS: Bones/Joint/Cartilage 1. Acute, closed comminuted fibular neck and head fracture with slight anterior displacement of fibular shaft by 2 mm. 2. Acute, closed comminuted fracture at the junction of the middle and distal third of the tibial shaft with 6 mm of lateral and 4 mm of dorsal displacement of the main fracture fragment. 3. Coronally oriented posterior malleolar fracture without displacement. 4. Anterolateral distal tibial epiphyseal fracture extending into the ankle joint with negligible lateral displacement of the fracture fragment. No dislocation at the knee or ankle. Subtalar and midfoot articulations to the extent visualized are intact. Ligaments Suboptimally assessed by CT. Muscles and Tendons Negative Soft tissues Soft tissue swelling and subcutaneous edema about the distal leg and ankle. IMPRESSION: 1. Acute, closed comminuted fibular neck and head fracture with slight anterior displacement of fibular shaft by 2 mm. 2. Acute, closed comminuted  fracture at the junction of the middle and distal third of the tibial shaft with 6 mm of lateral and 4 mm of dorsal displacement of the main fracture fragment. 3. Coronally oriented posterior malleolar fracture without displacement. 4. Anterolateral distal tibial epiphyseal fracture extending into the ankle joint with negligible lateral displacement of the fracture fragment. 5. No dislocation at the knee or ankle. Subtalar and midfoot articulations to the extent visualized are intact. Electronically Signed   By: Ashley Royalty M.D.   On: 10/06/2016 21:43   Mr Knee Right Wo Contrast  Result Date: 10/08/2016 CLINICAL DATA:  Status post fall. Right leg injury. Right knee hemarthrosis. EXAM: MRI OF THE RIGHT KNEE WITHOUT CONTRAST TECHNIQUE: Multiplanar, multisequence MR imaging of the knee was performed. No intravenous contrast was  administered. COMPARISON:  None. FINDINGS: MENISCI Medial meniscus: Small radial tear of the free edge of the body of the medial meniscus. Lateral meniscus: Radial tear of the free edge of the posterior horn of the lateral meniscus. Horizontal tear of the body of the lateral meniscus extending to the free edge. LIGAMENTS Cruciates:  Intact ACL and PCL. Collaterals: Medial collateral ligament is intact. Fibular collateral ligament is attenuated distally concerning for a partial tear. Iliotibial band and biceps femoris tendon are intact. CARTILAGE Patellofemoral:  No chondral defect. Medial: Partial-thickness cartilage loss of medial femorotibial compartment. Lateral: Partial-thickness cartilage loss of the lateral femorotibial compartment. Joint: Large hemarthrosis. Normal Hoffa's fat. No plical thickening. Popliteal Fossa:  No Baker cyst.  Intact popliteus tendon. Extensor Mechanism: Intact quadriceps tendon and patellar tendon. Intact medial and lateral patellar retinaculum. Intact MPFL. Bones: Comminuted fracture of the fibular head. No other marrow signal abnormality. No other fracture  or dislocation. Other: Severe edema in the proximal soleus muscle and tibia [place] anterior muscle likely reflecting muscle strain. IMPRESSION: 1. Large hemarthrosis. 2. Comminuted fracture of the fibular head. 3. Small radial tear of the free edge of the body of the medial meniscus. 4. Radial tear of the free edge of the posterior horn of the lateral meniscus. Horizontal tear of the body of the lateral meniscus extending to the free edge. 5. Fibular collateral ligament is attenuated distally concerning for a partial tear. Iliotibial band and biceps femoris tendon are intact. Electronically Signed   By: Kathreen Devoid   On: 10/08/2016 08:14     Assessment/Plan: Diagnosis: right tibial shaft fracture and knee effusion Labs independently reviewed.  Records reviewed and summated above.  1. Does the need for close, 24 hr/day medical supervision in concert with the patient's rehab needs make it unreasonable for this patient to be served in a less intensive setting? Yes 2. Co-Morbidities requiring supervision/potential complications:  Post-op pain (Biofeedback training with therapies to help reduce reliance on opiate pain medications, particularly IV dilaudid, monitor pain control during therapies, and sedation at rest and titrate to maximum efficacy to ensure participation and gains in therapies), constipation (adjust bowel reg as necessary), orthostasis (ensure appropriate fluid intake, may consider abd binder), hypotension (see previous), acute on chronic renal failure (avoid nephrotoxic meds), syncope (monitor), HTN (monitor and provide prns in accordance with increased physical exertion and pain), prediabetes  (Monitor in accordance with exercise and adjust meds as necessary), ABLA (transfuse if necessary to ensure appropriate perfusion for increased activity tolerance) 3. Due to bladder management, bowel management, safety, skin/wound care, disease management, pain management and patient education, does the  patient require 24 hr/day rehab nursing? Yes 4. Does the patient require coordinated care of a physician, rehab nurse, PT (1-2 hrs/day, 5 days/week) and OT (1-2 hrs/day, 5 days/week) to address physical and functional deficits in the context of the above medical diagnosis(es)? Yes Addressing deficits in the following areas: balance, endurance, locomotion, strength, transferring, bowel/bladder control, bathing, dressing, toileting and psychosocial support 5. Can the patient actively participate in an intensive therapy program of at least 3 hrs of therapy per day at least 5 days per week? Yes 6. The potential for patient to make measurable gains while on inpatient rehab is excellent 7. Anticipated functional outcomes upon discharge from inpatient rehab are supervision  with PT, supervision with OT, n/a with SLP. 8. Estimated rehab length of stay to reach the above functional goals is: 8-12 days. 9. Anticipated D/C setting: Home 10. Anticipated post D/C treatments:  HH therapy and Home excercise program 11. Overall Rehab/Functional Prognosis: excellent  RECOMMENDATIONS: This patient's condition is appropriate for continued rehabilitative care in the following setting: Will need post-op therapy evals.  Likely CIR when patient able to tolerate 3 hours of therapy/day. Patient has agreed to participate in recommended program. Yes Note that insurance prior authorization may be required for reimbursement for recommended care.  Comment: Rehab Admissions Coordinator to follow up.  Delice Lesch, MD, Mellody Drown 10/10/16 Bary Leriche, PA-C 10/08/2016    Revision History                        Routing History

## 2016-10-12 NOTE — Care Management Important Message (Signed)
Important Message  Patient Details  Name: Patrick Good MRN: [id number] Date of Birth: [birth year]   Medicare Important Message Given:  Yes    [name] 10/12/2016, 12:10 PM

## 2016-10-12 NOTE — Discharge Summary (Signed)
Orthopaedic Trauma Service (OTS)  Patient ID: Patrick Good MRN: [id number] DOB/AGE: [birth year] 76 y.o.  Admit date: 10/06/2016 Discharge date: 10/12/2016  Admission Diagnoses:  Fall Closed right tibial shaft fracture Closed right tibial plafond fracture and closed fibula fracture Acute renal injury Prediabetes Orthostasis Hypertension  Discharge Diagnoses:  Principal Problem:   Fracture of tibial shaft, right, closed Active Problems:   Essential (primary) hypertension   Closed fracture of right tibial plafond with fibula involvement   Renal insufficiency   Post-operative pain   Constipation due to pain medication   Orthostasis   Arterial hypotension   AKI (acute kidney injury) ([place])   Syncope   Acute blood loss anemia   Prediabetes   Vitamin D insufficiency   Fall   Procedures Performed:  10/09/2016- Dr. Marcelino Scot 1. INTRAMEDULLARY (IM) NAIL TIBIAL (Right) WITH BIOMET [place] 35m x 407mstatically locked 2. OPEN REDUCTION INTERNAL FIXATION (ORIF) ANKLE FRACTURE (Right) 3. STRESS FLOURO OF SYNDESMOSIS   Discharged Condition: good  Hospital Course:   76ear old white male admitted on 10/06/2016 after sustaining a fall. It is believed that the patient had an orthostatic event resulting in his fall. This resulted in a right tibia fracture along with associated right tibial plafond fracture. Patient was brought to [place] for evaluation. He was found to have these fractures. Patient was admitted to orthopedics with medicine consult. Due to the complexity of the injury the orthopedic trauma service was consult and for definitive treatment of his fractures. Patient was seen and evaluated by the orthopedic trauma service on 10/07/2016. He was taken to the operating room on 10/09/2016 for the procedures noted above. Patient tolerated the procedures well. After surgery he was transferred to the PACU for recovery from anesthesia and transferred back to the orthopedic  floor for observation, pain control and therapies. Patient's hospital stay was relatively unremarkable. No perioperative concerns were noted. Patient participated with therapies during his hospital stay. His pain was controlled with IV and oral pain medications. He was on perioperative antibiotics. He was on Lovenox for DVT and PE prophylaxis perioperatively and this will be continued for 4 weeks postoperatively as well. Patient was seen and evaluated by the PM&R team and deemed to be an appropriate candidate for inpatient rehabilitation. On postoperative day #3 patient was stable for discharge to inpatient rehabilitation.  Consults: rehabilitation medicine  Significant Diagnostic Studies: labs:  Results for Patrick Good, Patrick Good [id number] of 10/12/2016 11:28  Ref. Range 10/12/2016 02:34  Sodium Latest Ref Range: 135 - 145 mmol/L 140  Potassium Latest Ref Range: 3.5 - 5.1 mmol/L 3.8  Chloride Latest Ref Range: 101 - 111 mmol/L 106  CO2 Latest Ref Range: 22 - 32 mmol/L 28  Glucose Latest Ref Range: 65 - 99 mg/dL 114 (H)  BUN Latest Ref Range: 6 - 20 mg/dL 23 (H)  Creatinine Latest Ref Range: 0.61 - 1.24 mg/dL 1.03  Calcium Latest Ref Range: 8.9 - 10.3 mg/dL 8.2 (L)  Anion gap Latest Ref Range: 5 - 15  6  Phosphorus Latest Ref Range: 2.5 - 4.6 mg/dL 2.5  Albumin Latest Ref Range: 3.5 - 5.0 g/dL 2.7 (L)  EGFR (African American) Latest Ref Range: >60 mL/min >60  EGFR (Non-African Amer.) Latest Ref Range: >60 mL/min >60  WBC Latest Ref Range: 4.0 - 10.5 K/uL 6.0  RBC Latest Ref Range: 4.22 - 5.81 MIL/uL 3.13 (L)  Hemoglobin Latest Ref Range: 13.0 - 17.0 g/dL 9.6 (L)  HCT Latest Ref Range: 39.0 - 52.0 %  28.9 (L)  MCV Latest Ref Range: 78.0 - 100.0 fL 92.3  MCH Latest Ref Range: 26.0 - 34.0 pg 30.7  MCHC Latest Ref Range: 30.0 - 36.0 g/dL 33.2  RDW Latest Ref Range: 11.5 - 15.5 % 13.5  Platelets Latest Ref Range: 150 - 400 K/uL 134 (L)    Results for Patrick Good, Patrick Good (MRN [id number]) as of  10/12/2016 11:28  Ref. Range 10/09/2016 11:33  Vitamin D, 25-Hydroxy Latest Ref Range: 30.0 - 100.0 ng/mL 27.6 (L)   Treatments: IV hydration, antibiotics: Ancef, analgesia: Dilaudid, OxyIR and Tylenol, anticoagulation: LMW heparin, therapies: PT, OT and RN and surgery: As above  Discharge Exam:  Orthopedic Trauma Service Progress Note      Subjective:   Doing better Pain improved No specific concerns or complaints Would like to go to CIR      Review of Systems  Constitutional: Negative for chills and fever.  Respiratory: Negative for shortness of breath and wheezing.   Cardiovascular: Negative for chest pain and palpitations.  Gastrointestinal: Negative for abdominal pain, nausea and vomiting.  Neurological: Negative for tingling and sensory change.      Objective:    VITALS:         Vitals:    10/11/16 1016 10/11/16 1840 10/11/16 2006 10/12/16 0500  BP: (!) 98/49 103/60 113/61 109/64  Pulse: 79 80 74 72  Resp: _0 Temp: 98.7 F (37.1 C) 98.9 F (37.2 C) 97.9 F (36.6 C) 98.2 F (36.8 C)  TempSrc: Oral Oral Oral Oral  SpO2: 96% 98% 98% 97%  Weight:          Height:              Intake/Output      06/10 0701 - 06/11 0700 06/11 0701 - 06/12 0700   P.O.  240   Total Intake(mL/kg)  240 (2.1)   Urine (mL/kg/hr) 1100 (0.4)    Stool     Total Output 1100     Net -1100 +240        Urine Occurrence 2 x       LABS   Lab Results Last 24 Hours       Results for orders placed or performed during the hospital encounter of 10/06/16 (from the past 24 hour(s))  Renal function panel     Status: Abnormal    Collection Time: 10/12/16  2:34 AM  Result Value Ref Range    Sodium 140 135 - 145 mmol/L    Potassium 3.8 3.5 - 5.1 mmol/L    Chloride 106 101 - 111 mmol/L    CO2 28 22 - 32 mmol/L    Glucose, Bld 114 (H) 65 - 99 mg/dL    BUN 23 (H) 6 - 20 mg/dL    Creatinine, Ser 1.03 0.61 - 1.24 mg/dL    Calcium 8.2 (L) 8.9 - 10.3 mg/dL    Phosphorus 2.5 2.5 - 4.6  mg/dL    Albumin 2.7 (L) 3.5 - 5.0 g/dL    GFR calc non Af Amer >60 >60 mL/min    GFR calc Af Amer >60 >60 mL/min    Anion gap 6 5 - 15  CBC     Status: Abnormal    Collection Time: 10/12/16  2:34 AM  Result Value Ref Range    WBC 6.0 4.0 - 10.5 K/uL    RBC 3.13 (L) 4.22 - 5.81 MIL/uL    Hemoglobin 9.6 (L) 13.0 - 17.0 g/dL  HCT 28.9 (L) 39.0 - 52.0 %    MCV 92.3 78.0 - 100.0 fL    MCH 30.7 26.0 - 34.0 pg    MCHC 33.2 30.0 - 36.0 g/dL    RDW 13.5 11.5 - 15.5 %    Platelets 134 (L) 150 - 400 K/uL      Results for Patrick Good, Patrick Good (MRN [id number]) as of 10/12/2016 09:42   Ref. Range 10/09/2016 11:33  Vitamin D, 25-Hydroxy Latest Ref Range: 30.0 - 100.0 ng/mL 27.6 (L)      PHYSICAL EXAM:    Gen: sitting on EOB shaving, NAD, appears well Lungs: breathing unlabored Cardiac: regular  Ext:       Right Lower Extremity              Splint c/d/i             Ext warm              Brisk cap refill             Swelling well controlled             EHL, FHL, lesser toe motor intact             DPN, SPN, TN sensation intact              No pain with passive stretch    Assessment/Plan: 3 Days Post-Op    Principal Problem:   Fracture of tibial shaft, right, closed Active Problems:   Essential (primary) hypertension   Closed fracture of right tibial plafond with fibula involvement   Renal insufficiency   Tibia/fibula fracture   Post-operative pain   Constipation due to pain medication   Orthostasis   Arterial hypotension   AKI (acute kidney injury) ([place])   Syncope   Acute blood loss anemia   Prediabetes               Anti-infectives     Start     Dose/Rate Route Frequency Ordered Stop    10/09/16 1430   ceFAZolin (ANCEF) IVPB 1 g/50 mL premix     1 g 100 mL/hr over 30 Minutes Intravenous Every 6 hours 10/09/16 1247 10/09/16 2023    10/09/16 1245   ceFAZolin (ANCEF) IVPB 1 g/50 mL premix  Status:  Discontinued     1 g 100 mL/hr over 30 Minutes Intravenous Every 6 hours  10/09/16 1243 10/09/16 1247    10/09/16 0745   ceFAZolin (ANCEF) IVPB 2g/100 mL premix     2 g 200 mL/hr over 30 Minutes Intravenous To Short Stay 10/08/16 1144 10/09/16 0835     .   POD/HD#: 48   76 year old white male fall with right tibial shaft and right tibial plafond fracture   - Fall             Likely due to orthostasis   -Right tibial shaft and right tibial plafond fracture s/p IMN and ORIF             NWB x 8 weeks             Splint x 2 weeks then transition to CAM and begin ankle ROM             Unrestricted ROM R knee             Ice and elevate             Toe motion as tolerated  CIR eval and hopeful admission today      - Right knee internal derangement: Medial and lateral meniscal tears, partial tear LCL, degenerative changes medial and lateral compartments             Non-op management              No knee pain other than that associated with IMN              Pt had no knee pain pre-op              Symptomatic management    - Pain management:           continue with current regimen      - ABL anemia/Hemodynamics             stable                Blood pressure and heart rate stable   - Medical issues              Orthostasis                         Hypertensive medications are being held                         Working assumption is that concomitant use of his antihypertensive medications along with a 30+ weight loss over the last several months had precipitated orthostatic event                         Patient's blood pressures have been fantastic during his hospital stay    - DVT/PE prophylaxis:             Lovenox x 4 weeks  - ID:              Perioperative antibiotics completed    - Metabolic Bone Disease:             Vitamin D insufficiency                          Supplement with vitamin d 3 2000 IU daily    - Activity:             Up with assistance             Nonweightbearing right leg   - FEN/GI  prophylaxis/Foley/Lines:             Diet as tolerated     - Dispo:             hopeful for dc to CIR this afternoon        Disposition: Inpatient rehabilitation   Follow-up Information    Altamese Falmouth, MD. Call in 10 day(s).   Specialty:  Orthopedic Surgery Contact information: [address] [place] [address] [phone]           Discharge Instructions and Plan:    76 year old white male fall with right tibial shaft and right tibial plafond fracture   - Fall             Likely due to orthostasis   -Right tibial shaft and right tibial plafond fracture s/p IMN and ORIF             NWB x 8 weeks  Splint x 2 weeks then transition to CAM and begin ankle ROM             Unrestricted ROM R knee   PT- please teach HEP for R knee ROM- AROM, PROM. Prone exercises as well. No ROM restrictions.  Quad sets, SLR, LAQ, SAQ, heel slides, stretching, prone flexion and extension No pillows under bend of knee when at rest, ok to place under heel to help work on extension             Ice and elevate             Toe motion as tolerated      - Right knee internal derangement: Medial and lateral meniscal tears, partial tear LCL, degenerative changes medial and lateral compartments             Non-op management              No knee pain other than that associated with IMN              Pt had no knee pain pre-op              Symptomatic management    - Pain management:           continue with current regimen      - ABL anemia/Hemodynamics             stable                Blood pressure and heart rate stable   - Medical issues              Orthostasis                         Hypertensive medications are being held                         Working assumption is that concomitant use of his antihypertensive medications along with a 30+ weight loss over the last several months had precipitated orthostatic event                         Patient's  blood pressures have been fantastic during his hospital stay    - DVT/PE prophylaxis:             Lovenox x 4 weeks  - ID:              Perioperative antibiotics completed    - Metabolic Bone Disease:             Vitamin D insufficiency                          Supplement with vitamin d 3 2000 IU daily    - Activity:             Up with assistance             Nonweightbearing right leg   - FEN/GI prophylaxis/Foley/Lines:             Diet as tolerated     - Dispo:            CIR this afternoon   Follow-up with orthopedics in 10 days     Signed:  Jari Pigg, PA-C [facility] [phone] (P) 10/12/2016, 11:24 AM

## 2016-10-12 NOTE — PMR Pre-admission (Signed)
PMR Admission Coordinator Pre-Admission Assessment  Patient: Patrick Good is an 76 y.o., male MRN: [id number] DOB: 1940-09-16 Height: 6\' 3"  (190.5 cm) Weight: 114.8 kg (253 lb)              Insurance Information HMO:     PPO:      PCP:      IPA:      80/20: yes     OTHER: no HMO PRIMARY: Medicare a and b      Policy#: [id number]      Subscriber: pt Benefits:  Phone #: passport one online     Name: 10/12/2016 Eff. Date: 03/04/2008     Deduct: $1340      Out of Pocket Max: none      Life Max: none CIR: 100%      SNF: 20 full days Outpatient: 80%     Co-Pay: 20% Home Health: 100%      Co-Pay: none DME: 80%     Co-Pay: 20% Providers: pt choice  SECONDARY: Tricare for Life      Policy#: [id number]      Subscriber: pt  Medicaid Application Date:       Case Manager:  Disability Application Date:       Case Worker:   Emergency Contact Information Contact Information    Name Relation Home Work Mobile   [place] Spouse [phone]     Silverio,Stephanine Daughter [phone]       Current Medical History  Patient Admitting Diagnosis: right tibial shaft fracture and knee effusion  History of Present Illness: HPI: Patrick Good is a 76 y.o. male with history of HTN, prediabetes who sustained a fall due to episode of dizziness followed by brief syncopal episode while mowing his lawn. History taken from chart review, patient, and wife. 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 He felt a snap of his ankle and sustained complex right tibial fracture with intraarticular extension. Hospitalist following for management of dehydration due to acute on chronic renal insufficiency and issues with hypotension. Syncope felt to be due to orthostasis. Dr. Marcelino Scot consulted and right knee effusion aspirated with plans for surgery. IM nail in right tibia with ORIF of ankle performed 10/09/16.  Hospital course complicated by  pain, constipation.   Past Medical History  Past Medical History:  Diagnosis Date  . Closed fracture of right tibial plafond with fibula involvement 10/06/2016  . Fracture of tibial shaft, right, closed 10/08/2016  . History of ankle fracture 1982   spiral break; left ankle  . History of chicken pox   . History of measles   . Hypertension   . Vitamin D insufficiency 10/12/2016    Family History  family history includes Breast cancer in his sister; Diabetes type II in his sister; Heart Problems in his sister; Multiple sclerosis in his sister.  Prior Rehab/Hospitalizations:  Has the patient had major surgery during 100 days prior to admission? No  Current Medications   Current Facility-Administered Medications:  .  0.9 %  sodium chloride infusion, , Intravenous, Continuous, Netta Cedars, MD, Last Rate: 75 mL/hr at 10/09/16 1301 .  acetaminophen (TYLENOL) tablet 650 mg, 650 mg, Oral, Q6H PRN **OR** acetaminophen (TYLENOL) suppository 650 mg, 650 mg, Rectal, Q6H PRN, Ainsley Spinner, PA-C .  acetaminophen (TYLENOL) tablet 1,000 mg, 1,000 mg, Oral, Q6H, Ainsley Spinner, PA-C, 1,000 mg at 10/12/16 0932 .  bisacodyl (DULCOLAX) suppository  10 mg, 10 mg, Rectal, Daily PRN, Ainsley Spinner, PA-C .  cholecalciferol (VITAMIN D) tablet 2,000 Units, 2,000 Units, Oral, Daily, Ainsley Spinner, PA-C, 2,000 Units at 10/12/16 1104 .  docusate sodium (COLACE) capsule 100 mg, 100 mg, Oral, BID, Netta Cedars, MD, 100 mg at 10/12/16 0815 .  enoxaparin (LOVENOX) injection 40 mg, 40 mg, Subcutaneous, Q24H, Netta Cedars, MD, 40 mg at 10/12/16 0816 .  HYDROmorphone (DILAUDID) injection 0.5-1 mg, 0.5-1 mg, Intravenous, Q2H PRN, Ainsley Spinner, PA-C, 1 mg at 10/10/16 6712 .  latanoprost (XALATAN) 0.005 % ophthalmic solution 1 drop, 1 drop, Both Eyes, QHS, Netta Cedars, MD, 1 drop at 10/11/16 2157 .  methocarbamol (ROBAXIN) tablet 500 mg, 500 mg, Oral, Q6H PRN, 500 mg at 10/10/16 0213 **OR** methocarbamol (ROBAXIN) 500 mg in  dextrose 5 % 50 mL IVPB, 500 mg, Intravenous, Q6H PRN, Netta Cedars, MD .  methocarbamol (ROBAXIN) tablet 500 mg, 500 mg, Oral, TID, Lavina Hamman, MD, 500 mg at 10/12/16 0815 .  metoCLOPramide (REGLAN) tablet 5-10 mg, 5-10 mg, Oral, Q8H PRN **OR** metoCLOPramide (REGLAN) injection 5-10 mg, 5-10 mg, Intravenous, Q8H PRN, Ainsley Spinner, PA-C .  multivitamin with minerals tablet 1 tablet, 1 tablet, Oral, Daily, Netta Cedars, MD, 1 tablet at 10/12/16 [phone] .  ondansetron (ZOFRAN) tablet 4 mg, 4 mg, Oral, Q6H PRN **OR** ondansetron (ZOFRAN) injection 4 mg, 4 mg, Intravenous, Q6H PRN, Ainsley Spinner, PA-C .  oxyCODONE (Oxy IR/ROXICODONE) immediate release tablet 5-15 mg, 5-15 mg, Oral, Q3H PRN, Shepperson, Kirstin, PA-C, 10 mg at 10/11/16 1306 .  polyethylene glycol (MIRALAX / GLYCOLAX) packet 17 g, 17 g, Oral, BID, Shepperson, Kirstin, PA-C, 17 g at 10/10/16 0950  Patients Current Diet: Diet regular Room service appropriate? Yes; Fluid consistency: Thin  Precautions / Restrictions Precautions Precautions: Fall Other Brace/Splint: R ankle splint Restrictions Weight Bearing Restrictions: Yes RLE Weight Bearing: Non weight bearing   Has the patient had 2 or more falls or a fall with injury in the past year?No  Prior Activity Level Community (5-7x/wk): independent, working, driving, Nurse, adult / [facility] Devices/Equipment: None Home Equipment: Cane - single point, Crutches, Hand held shower head  Prior Device Use: Indicate devices/aids used by the patient prior to current illness, exacerbation or injury? None of the above  Prior Functional Level Prior Function Level of Independence: Independent  Self Care: Did the patient need help bathing, dressing, using the toilet or eating?  Independent  Indoor Mobility: Did the patient need assistance with walking from room to room (with or without device)? Independent  Stairs: Did the patient need assistance with  internal or external stairs (with or without device)? Independent  Functional Cognition: Did the patient need help planning regular tasks such as shopping or remembering to take medications? Independent  Current Functional Level Cognition  Overall Cognitive Status: Within Functional Limits for tasks assessed Orientation Level: Oriented X4    Extremity Assessment (includes Sensation/Coordination)  Upper Extremity Assessment: Overall WFL for tasks assessed  Lower Extremity Assessment: RLE deficits/detail RLE Deficits / Details: heavy splint and ace wrap to leg, unable to lift unaided, able to wiggle toes, severe pain at times when elevating leg, reports meniscal injury as well in knee, able to perform quad set, ROM NT due to ankle pian    ADLs  Overall ADL's : Needs assistance/impaired Eating/Feeding: Set up, Sitting Grooming: Set up, Sitting Upper Body Bathing: Set up, Sitting, Supervision/ safety Lower Body Bathing: Moderate assistance, Sit to/from stand Upper Body Dressing :  Set up, Supervision/safety, Sitting Lower Body Dressing: Moderate assistance, Sit to/from stand Lower Body Dressing Details (indicate cue type and reason): Reviewed dressing technique Toilet Transfer: Minimal assistance, BSC, Ambulation, Cueing for sequencing, Cueing for safety, RW Toilet Transfer Details (indicate cue type and reason): Educated on 3N1 set up at home Tub/ Shower Transfer: Walk-in shower, 3 in 1, Ambulation, Rolling walker, Min Chief Executive Officer Details (indicate cue type and reason): Min guard for safety. Discussed different positioning for the 3N1 to accomidate home set up Functional mobility during ADLs: Rolling walker, Min guard General ADL Comments: Demonstrating progress. Discussed with pt dc options and car transfer.    Mobility  Overal bed mobility: Needs Assistance Bed Mobility: Supine to Sit Supine to sit: Supervision, HOB elevated General bed mobility comments: sitting EOB  when PT arrives    Transfers  Overall transfer level: Needs assistance Equipment used: Rolling walker (2 wheeled) Transfers: Sit to/from Stand Sit to Stand: Min guard Stand pivot transfers: Min guard General transfer comment: Min guard for safety    Ambulation / Gait / Stairs / Wheelchair Mobility  Ambulation/Gait Ambulation/Gait assistance: Museum/gallery curator (Feet): 20 Feet Assistive device: Rolling walker (2 wheeled) Gait Pattern/deviations: Step-to pattern General Gait Details: good sequencing and tolerance for gait with RW Gait velocity: decreased Gait velocity interpretation: Below normal speed for age/gender Stairs: Yes Stairs assistance: Min assist Stair Management: No rails, Step to pattern, Forwards, With walker Number of Stairs: 1 General stair comments: practice 1 step with RW. Pt able to maintain NWB and perform safely with Min A for safety    Posture / Balance Dynamic Sitting Balance Sitting balance - Comments: donned plants with no LOB in sitting Balance Overall balance assessment: Needs assistance Sitting-balance support: No upper extremity supported, Feet supported Sitting balance-Leahy Scale: Good Sitting balance - Comments: donned plants with no LOB in sitting Standing balance support: Bilateral upper extremity supported, During functional activity Standing balance-Leahy Scale: Poor Standing balance comment: reliant on RW and physical assist for support for balance    Special needs/care consideration BiPAP/CPAP  N/a CPM  N/a Continuous Drip IV  N/a Dialysis n/a Life Vest  N/a Oxygen  N/a Special Bed  N/a Trach Size  N/a Wound Vac n/a Skin surgical incision with ace wrap Bowel mgmt: 10/12/2016 some diarrhea Bladder mgmt: continent Diabetic mgmt prediabetes dx    Previous Home Environment Living Arrangements: Spouse/significant other  Lives With: Spouse Available Help at Discharge: Family, Available 24 hours/day Type of Home:  House Home Layout: Two level, Able to live on main level with bedroom/bathroom Home Access: Stairs to enter Entrance Stairs-Rails: None Entrance Stairs-Number of Steps: 2 (6") Bathroom Shower/Tub: Multimedia programmer: Handicapped height Bathroom Accessibility: Yes How Accessible: Accessible via walker Home Care Services: No  Discharge Living Setting Plans for Discharge Living Setting: Patient's home, Lives with (comment) (wife) Type of Home at Discharge: House Discharge Home Layout: Two level, Able to live on main level with bedroom/bathroom Discharge Home Access: Stairs to enter Entrance Stairs-Rails: None Entrance Stairs-Number of Steps: 2 Discharge Bathroom Shower/Tub: Walk-in shower Discharge Bathroom Toilet: Handicapped height Discharge Bathroom Accessibility: Yes How Accessible: Accessible via walker Does the patient have any problems obtaining your medications?: No  Social/Family/Support Systems Patient Roles: Spouse, Parent, 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: Earlie Server, wife and daughter, [place] Anticipated Caregiver: wife and daughter Anticipated Caregiver's Contact Information: see above Ability/Limitations of Caregiver: wife has bad knees, daughter works Building control surveyor Availability: 24/7 Discharge Plan Discussed with Primary  Caregiver: Yes Is Caregiver In Agreement with Plan?: Yes Does Caregiver/Family have Issues with Lodging/Transportation while Pt is in Rehab?: No  Goals/Additional Needs Patient/Family Goal for Rehab: supervision with PT and OT Expected length of stay: [facility] 8- 12 days, but pt anticipates 3-4 Pt/Family Agrees to Admission and willing to participate: Yes Program Orientation Provided & Reviewed with Pt/Caregiver Including Roles  & Responsibilities: Yes  Decrease burden of Care through IP rehab admission: n/a  Possible need for SNF placement upon discharge:not anticipated  Patient Condition: This patient's condition  remains as documented in the consult dated 10/10/2016, in which the Rehabilitation Physician determined and documented that the patient's condition is appropriate for intensive rehabilitative care in an inpatient rehabilitation facility. Will admit to inpatient rehab today.  Preadmission Screen Completed By:  Cleatrice Burke, 10/12/2016 11:53 AM ______________________________________________________________________   Discussed status with Dr. Posey Pronto on 10/12/2016 at  1152 and received telephone approval for admission today.  Admission Coordinator:  Cleatrice Burke, time 9480 Date 10/12/2016

## 2016-10-12 NOTE — Progress Notes (Signed)
I met with pt , his wife, and daughter at bedside to discuss an inpt rehab admission. All are in agreement to admit today. I will make the arrangements. Ainsley Spinner, PA made aware as well as RN CM and SW. [phone]

## 2016-10-12 NOTE — H&P (Addendum)
Physical Medicine and Rehabilitation Admission H&P    Chief Complaint  Patient presents with  . Fall with tibia and ankle fractures.     HPI:  Patrick Good is a 76 y.o. male with history of HTN, prediabetes who sustained a fall due to episode of dizziness followed by brief syncopal episode while mowing his lawn. History taken from chart review, patient, and wife. 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 He felt a snap of his ankle and sustained complex right tibial fracture with intraarticular extension. Hospitalist following for management of dehydration due to acute on chronic renal insufficiency and issues with hypotension. Syncope felt to be due to orthostasis. Dr. Marcelino Scot consulted and right knee effusion aspirated 6/6 and patient underwent IM nailing of right tibia and ORIF right ankle on 6/8 by Dr. Marcelino Scot. Post op NWB RLE with ROM of knee as tolerated. He has had issue hypotension, ABLA as well as pain control. CIR recommended due to deficits in mobility as well as decreased ability to carry out ADL tasks.     Review of Systems  Constitutional: Negative for chills, fever and malaise/fatigue.  HENT: Positive for tinnitus (occasionally). Negative for hearing loss.   Eyes: Negative for blurred vision and double vision.  Respiratory: Negative for cough, hemoptysis and stridor.   Cardiovascular: Negative for chest pain and palpitations.  Gastrointestinal: Negative for abdominal pain, constipation, heartburn and nausea.  Genitourinary: Negative for dysuria and urgency.  Musculoskeletal: Positive for joint pain.  Skin: Negative for itching and rash.  Neurological: Positive for dizziness (with certain movements) and focal weakness. Negative for headaches.  Psychiatric/Behavioral: Negative for depression. The patient is not nervous/anxious and does not have insomnia.   All other systems reviewed and negative.    Past Medical  History:  Diagnosis Date  . Closed fracture of right tibial plafond with fibula involvement 10/06/2016  . Fracture of tibial shaft, right, closed 10/08/2016  . History of ankle fracture 1982   spiral break; left ankle  . History of chicken pox   . History of measles   . Hypertension   . Vitamin D insufficiency 10/12/2016    Past Surgical History:  Procedure Laterality Date  . HERNIA REPAIR  1946 and 1952  . KNEE SURGERY  12/2011   Left meniscus by Dr. Wylene Simmer in [place]  . Myocardial Perfusion Scan  07/29/2011   Normal  . VASECTOMY  1972    Family History  Problem Relation Age of Onset  . Heart Problems Sister   . Diabetes type II Sister   . Breast cancer Sister   . Multiple sclerosis Sister     Social History:  Married. 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 He reports that he quit smoking about 24 years ago. His smoking use included Cigarettes. He has a 12.50 pack-year smoking history. He has never used smokeless tobacco. He reports that he does not drink alcohol or use drugs.    Allergies: No Active Allergies    Medications Prior to Admission  Medication Sig Dispense Refill  . aspirin 81 MG tablet Take 1 tablet by mouth daily.    Marland Kitchen ibuprofen (ADVIL,MOTRIN) 200 MG tablet Take 400 mg by mouth as needed for headache.    . latanoprost (XALATAN) 0.005 % ophthalmic solution Place 1 drop into both eyes at bedtime.     Marland Kitchen lisinopril-hydrochlorothiazide (PRINZIDE,ZESTORETIC) 10-12.5 MG tablet TAKE 1 TABLET DAILY 90  tablet 4  . Multiple Vitamins-Minerals (MULTIVITAMIN ADULT PO) Take 1 tablet by mouth daily.    . Multiple Vitamins-Minerals (VISION VITAMINS PO) Take by mouth.    . nystatin cream (MYCOSTATIN) Apply 1 application topically 2 (two) times daily. (Patient taking differently: Apply 1 application topically as needed (rash). ) 15 g 0  . triamcinolone ointment (KENALOG) 0.5 % Apply 1 application topically 2 (two) times daily. To rash on forearm 15  g 0    Home: Home Living Family/patient expects to be discharged to:: Private residence Living Arrangements: Spouse/significant other Available Help at Discharge: Family, Available 24 hours/day Type of Home: House Home Access: Stairs to enter [employer] of Steps: 2 (6") Entrance Stairs-Rails: None Home Layout: Two level, Able to live on main level with bedroom/bathroom Bathroom Shower/Tub: Multimedia programmer: Handicapped height Bathroom Accessibility: Yes Home Equipment: [place] - single point, Crutches, Hand held shower head  Lives With: Spouse   Functional History: Prior Function Level of Independence: Independent  Functional Status:  Mobility: Bed Mobility Overal bed mobility: Needs Assistance Bed Mobility: Supine to Sit Supine to sit: Supervision, HOB elevated General bed mobility comments: sitting EOB when PT arrives Transfers Overall transfer level: Needs assistance Equipment used: Rolling walker (2 wheeled) Transfers: Sit to/from Stand Sit to Stand: Min guard Stand pivot transfers: Min guard General transfer comment: Min guard for safety Ambulation/Gait Ambulation/Gait assistance: Min assist Ambulation Distance (Feet): 20 Feet Assistive device: Rolling walker (2 wheeled) Gait Pattern/deviations: Step-to pattern General Gait Details: good sequencing and tolerance for gait with RW Gait velocity: decreased Gait velocity interpretation: Below normal speed for age/gender Stairs: Yes Stairs assistance: Min assist Stair Management: No rails, Step to pattern, Forwards, With walker Number of Stairs: 1 General stair comments: practice 1 step with RW. Pt able to maintain NWB and perform safely with Min A for safety    ADL: ADL Overall ADL's : Needs assistance/impaired Eating/Feeding: Set up, Sitting Grooming: Set up, Sitting Upper Body Bathing: Set up, Sitting, Supervision/ safety Lower Body Bathing: Moderate assistance, Sit to/from  stand Upper Body Dressing : Set up, Supervision/safety, Sitting Lower Body Dressing: Moderate assistance, Sit to/from stand Lower Body Dressing Details (indicate cue type and reason): Reviewed dressing technique Toilet Transfer: Minimal assistance, BSC, Ambulation, Cueing for sequencing, Cueing for safety, RW Toilet Transfer Details (indicate cue type and reason): Educated on 3N1 set up at home Tub/ Shower Transfer: Walk-in shower, 3 in 1, Ambulation, Rolling walker, Min Chief Executive Officer Details (indicate cue type and reason): Min guard for safety. Discussed different positioning for the 3N1 to accomidate home set up Functional mobility during ADLs: Rolling walker, Min guard General ADL Comments: Demonstrating progress. Discussed with pt dc options and car transfer.  Cognition: Cognition Overall Cognitive Status: Within Functional Limits for tasks assessed Orientation Level: Oriented X4 Cognition Arousal/Alertness: Awake/alert Behavior During Therapy: WFL for tasks assessed/performed, Anxious Overall Cognitive Status: Within Functional Limits for tasks assessed     Blood pressure 109/64, pulse 72, temperature 98.2 F (36.8 C), temperature source Oral, resp. rate 18, height '6\' 3"'$  (1.905 m), weight 114.8 kg (253 lb), SpO2 97 %. Physical Exam  Nursing note and vitals reviewed. Constitutional: He is oriented to person, place, and time. He appears well-developed and well-nourished.  HENT:  Head: Normocephalic and atraumatic.  Eyes: Conjunctivae and EOM are normal. Pupils are equal, round, and reactive to light.  Neck: Normal range of motion. Neck supple.  Cardiovascular: Normal rate and rhythm.   Respiratory: Effort normal and  breath sounds normal. No stridor.  GI: Soft. Bowel sounds are normal. He exhibits no distension. There is no tenderness.  Musculoskeletal: He exhibits edema and tenderness.  Neurological: He is alert and oriented to person, place, and time.  Sensation  intact to light touch Motor: B/l UE, LLE 5/5 proximal to distal LLE: HF 4+/5, knee and ankle dressed. Wiggles toes.  Skin: Skin is warm and dry. RLE dressing c/d/i   Results for orders placed or performed during the hospital encounter of 10/06/16 (from the past 48 hour(s))  Renal function panel     Status: Abnormal   Collection Time: 10/11/16  2:36 AM  Result Value Ref Range   Sodium 136 135 - 145 mmol/L   Potassium 4.1 3.5 - 5.1 mmol/L   Chloride 102 101 - 111 mmol/L   CO2 29 22 - 32 mmol/L   Glucose, Bld 119 (H) 65 - 99 mg/dL   BUN 24 (H) 6 - 20 mg/dL   Creatinine, Ser 1.24 0.61 - 1.24 mg/dL   Calcium 8.4 (L) 8.9 - 10.3 mg/dL   Phosphorus 3.3 2.5 - 4.6 mg/dL   Albumin 2.9 (L) 3.5 - 5.0 g/dL   GFR calc non Af Amer 56 (L) >60 mL/min   GFR calc Af Amer >60 >60 mL/min    Comment: (NOTE) The eGFR has been calculated using the CKD EPI equation. This calculation has not been validated in all clinical situations. eGFR's persistently <60 mL/min signify possible Chronic Kidney Disease.    Anion gap 5 5 - 15  CBC     Status: Abnormal   Collection Time: 10/11/16  2:36 AM  Result Value Ref Range   WBC 6.4 4.0 - 10.5 K/uL   RBC 3.29 (L) 4.22 - 5.81 MIL/uL   Hemoglobin 9.9 (L) 13.0 - 17.0 g/dL   HCT 30.4 (L) 39.0 - 52.0 %   MCV 92.4 78.0 - 100.0 fL   MCH 30.1 26.0 - 34.0 pg   MCHC 32.6 30.0 - 36.0 g/dL   RDW 13.4 11.5 - 15.5 %   Platelets 120 (L) 150 - 400 K/uL  Basic metabolic panel     Status: Abnormal   Collection Time: 10/11/16  2:36 AM  Result Value Ref Range   Sodium 138 135 - 145 mmol/L   Potassium 4.3 3.5 - 5.1 mmol/L   Chloride 103 101 - 111 mmol/L   CO2 29 22 - 32 mmol/L   Glucose, Bld 122 (H) 65 - 99 mg/dL   BUN 24 (H) 6 - 20 mg/dL   Creatinine, Ser 1.23 0.61 - 1.24 mg/dL   Calcium 8.4 (L) 8.9 - 10.3 mg/dL   GFR calc non Af Amer 56 (L) >60 mL/min   GFR calc Af Amer >60 >60 mL/min    Comment: (NOTE) The eGFR has been calculated using the CKD EPI equation. This  calculation has not been validated in all clinical situations. eGFR's persistently <60 mL/min signify possible Chronic Kidney Disease.    Anion gap 6 5 - 15  Renal function panel     Status: Abnormal   Collection Time: 10/12/16  2:34 AM  Result Value Ref Range   Sodium 140 135 - 145 mmol/L   Potassium 3.8 3.5 - 5.1 mmol/L   Chloride 106 101 - 111 mmol/L   CO2 28 22 - 32 mmol/L   Glucose, Bld 114 (H) 65 - 99 mg/dL   BUN 23 (H) 6 - 20 mg/dL   Creatinine, Ser 1.03 0.61 - 1.24 mg/dL  Calcium 8.2 (L) 8.9 - 10.3 mg/dL   Phosphorus 2.5 2.5 - 4.6 mg/dL   Albumin 2.7 (L) 3.5 - 5.0 g/dL   GFR calc non Af Amer >60 >60 mL/min   GFR calc Af Amer >60 >60 mL/min    Comment: (NOTE) The eGFR has been calculated using the CKD EPI equation. This calculation has not been validated in all clinical situations. eGFR's persistently <60 mL/min signify possible Chronic Kidney Disease.    Anion gap 6 5 - 15  CBC     Status: Abnormal   Collection Time: 10/12/16  2:34 AM  Result Value Ref Range   WBC 6.0 4.0 - 10.5 K/uL   RBC 3.13 (L) 4.22 - 5.81 MIL/uL   Hemoglobin 9.6 (L) 13.0 - 17.0 g/dL   HCT 28.9 (L) 39.0 - 52.0 %   MCV 92.3 78.0 - 100.0 fL   MCH 30.7 26.0 - 34.0 pg   MCHC 33.2 30.0 - 36.0 g/dL   RDW 13.5 11.5 - 15.5 %   Platelets 134 (L) 150 - 400 K/uL   No results found.     Medical Problem List and Plan: 1.  Deficits in mobility as well as decreased ability to carry out ADL tasks secondary to right tibial shaft fracture and knee effusion. 2.  DVT Prophylaxis/Anticoagulation: Pharmaceutical: Lovenox 3. Pain Management: Continue oxycodone prn 4. Mood: LCSW to follow for evaluation and support.  5. Neuropsych: This patient is capable of making decisions on his own behalf. 6. Skin/Wound Care: Monitor wound for healing. Maintain adequate and hydration status. Add supplements to promote wound healing and as intake poor 7. Fluids/Electrolytes/Nutrition: Monitor I/O. Check lytes in am 8.  Dizziness due to hypotension: check orthostatic vitals. Off 9. Pre-diabetes: Hgb A1c- 5.7. Has lost 40 lbs in past few months. Will have dietitian educate on CM diet and moderation.  10. AKI: BUN/SCr 37/1.65 at admission.  Improved with hydration but trending back upwards--encourage fluid intake. May need IVF for hydration.  11. ABLA: recheck CBC in am. Add iron supplement. 12. Constipation: Continue miralax--may need enema. Discontinue colace due to liquid stools.  13. Thrombocytopenia: Monitor for signs of bleeding.  14. Protein calorie malnutrition: Supplements added.     Post Admission Physician Evaluation: 1. Preadmission assessment reviewed and changes made below. 2. Functional deficits secondary to right tibial shaft fracture and knee effusion. 3. Patient is admitted to receive collaborative, interdisciplinary care between the physiatrist, rehab nursing staff, and therapy team. 4. Patient's level of medical complexity and substantial therapy needs in context of that medical necessity cannot be provided at a lesser intensity of care such as a SNF. 5. Patient has experienced substantial functional loss from his/her baseline which was documented above under the "Functional History" and "Functional Status" headings.  Judging by the patient's diagnosis, physical exam, and functional history, the patient has potential for functional progress which will result in measurable gains while on inpatient rehab.  These gains will be of substantial and practical use upon discharge  in facilitating mobility and self-care at the household level. 6. Physiatrist will provide 24 hour management of medical needs as well as oversight of the therapy plan/treatment and provide guidance as appropriate regarding the interaction of the two. 7. 24 hour rehab nursing will assist with bladder management, bowel management, safety, skin/wound care, disease management, pain management and patient education and help integrate  therapy concepts, techniques,education, etc. 8. PT will assess and treat for/with: Lower extremity strength, range of motion, stamina, balance, functional mobility,  safety, adaptive techniques and equipment, woundcare, coping skills, pain control, education.   Goals are: Supervision/Mod I. 9. OT will assess and treat for/with: ADL's, functional mobility, safety, upper extremity strength, adaptive techniques and equipment, wound mgt, ego support, and community reintegration.   Goals are: Supervision/Mod I. Therapy may not proceed with showering this patient. 10. Case Management and Social Worker will assess and treat for psychological issues and discharge planning. 11. Team conference will be held weekly to assess progress toward goals and to determine barriers to discharge. 12. Patient will receive at least 3 hours of therapy per day at least 5 days per week. 13. ELOS: 10-14 days.       14. Prognosis:  excellent  Delice Lesch, MD, [address] [place] 10/12/2016

## 2016-10-12 NOTE — Progress Notes (Signed)
Cristina Gong, RN Rehab Admission Coordinator Signed Physical Medicine and Rehabilitation  PMR Pre-admission Date of Service: 10/12/2016 11:45 AM  Related encounter: ED to Hosp-Admission (Current) from 10/06/2016 in [place]       [] Hide copied text PMR Admission Coordinator Pre-Admission Assessment  Patient: Patrick Good is an 76 y.o., male MRN: [id number] DOB: 04/13/1941 Height: 6\' 3"  (190.5 cm) Weight: 114.8 kg (253 lb)                                                                                                                                                  Insurance Information HMO:     PPO:      PCP:      IPA:      80/20: yes     OTHER: no HMO PRIMARY: Medicare a and b      Policy#: [id number]      Subscriber: pt Benefits:  Phone #: passport one online     Name: 10/12/2016 Eff. Date: 03/04/2008     Deduct: $1340      Out of Pocket Max: none      Life Max: none CIR: 100%      SNF: 20 full days Outpatient: 80%     Co-Pay: 20% Home Health: 100%      Co-Pay: none DME: 80%     Co-Pay: 20% Providers: pt choice  SECONDARY: Tricare for Life      Policy#: [id number]      Subscriber: pt  Medicaid Application Date:       Case Manager:  Disability Application Date:       Case Worker:   Emergency Contact Information        Contact Information    Name Relation Home Work Mobile   Matewan K Spouse [phone]     Sausedo,Stephanine Daughter [phone]       Current Medical History  Patient Admitting Diagnosis: right tibial shaft fracture and knee effusion  History of Present Illness: HPI: Kinney Sackmann Bourlandis a 76 y.o.malewith history of HTN, prediabetes who sustained a fall due to episode of dizziness followed by brief syncopal episode while mowing his lawn. History taken from chart review, patient, and wife. 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 He felt a snap of his ankle and sustained complex right tibial fracture with intraarticular extension. Hospitalist following for management of dehydration due to acute on chronic renal insufficiency and issues with hypotension. Syncope felt to be due to orthostasis. Dr. Marcelino Scot consulted and right knee effusion aspirated with plans for surgery. IM nail in right tibia with ORIF of ankle performed 10/09/16. Hospital course complicated by pain, constipation.   Past Medical History  Past Medical History:  Diagnosis Date  .  Closed fracture of right tibial plafond with fibula involvement 10/06/2016  . Fracture of tibial shaft, right, closed 10/08/2016  . History of ankle fracture 1982   spiral break; left ankle  . History of chicken pox   . History of measles   . Hypertension   . Vitamin D insufficiency 10/12/2016    Family History  family history includes Breast cancer in his sister; Diabetes type II in his sister; Heart Problems in his sister; Multiple sclerosis in his sister.  Prior Rehab/Hospitalizations:  Has the patient had major surgery during 100 days prior to admission? No  Current Medications   Current Facility-Administered Medications:  .  0.9 %  sodium chloride infusion, , Intravenous, Continuous, Netta Cedars, MD, Last Rate: 75 mL/hr at 10/09/16 1301 .  acetaminophen (TYLENOL) tablet 650 mg, 650 mg, Oral, Q6H PRN **OR** acetaminophen (TYLENOL) suppository 650 mg, 650 mg, Rectal, Q6H PRN, Ainsley Spinner, PA-C .  acetaminophen (TYLENOL) tablet 1,000 mg, 1,000 mg, Oral, Q6H, Ainsley Spinner, PA-C, 1,000 mg at 10/12/16 3295 .  bisacodyl (DULCOLAX) suppository 10 mg, 10 mg, Rectal, Daily PRN, Ainsley Spinner, PA-C .  cholecalciferol (VITAMIN D) tablet 2,000 Units, 2,000 Units, Oral, Daily, Ainsley Spinner, PA-C, 2,000 Units at 10/12/16 1104 .  docusate sodium (COLACE) capsule 100 mg, 100 mg, Oral, BID, Netta Cedars, MD, 100 mg at 10/12/16 0815 .  enoxaparin (LOVENOX) injection 40 mg,  40 mg, Subcutaneous, Q24H, Netta Cedars, MD, 40 mg at 10/12/16 0816 .  HYDROmorphone (DILAUDID) injection 0.5-1 mg, 0.5-1 mg, Intravenous, Q2H PRN, Ainsley Spinner, PA-C, 1 mg at 10/10/16 1884 .  latanoprost (XALATAN) 0.005 % ophthalmic solution 1 drop, 1 drop, Both Eyes, QHS, Netta Cedars, MD, 1 drop at 10/11/16 2157 .  methocarbamol (ROBAXIN) tablet 500 mg, 500 mg, Oral, Q6H PRN, 500 mg at 10/10/16 0213 **OR** methocarbamol (ROBAXIN) 500 mg in dextrose 5 % 50 mL IVPB, 500 mg, Intravenous, Q6H PRN, Netta Cedars, MD .  methocarbamol (ROBAXIN) tablet 500 mg, 500 mg, Oral, TID, Lavina Hamman, MD, 500 mg at 10/12/16 0815 .  metoCLOPramide (REGLAN) tablet 5-10 mg, 5-10 mg, Oral, Q8H PRN **OR** metoCLOPramide (REGLAN) injection 5-10 mg, 5-10 mg, Intravenous, Q8H PRN, Ainsley Spinner, PA-C .  multivitamin with minerals tablet 1 tablet, 1 tablet, Oral, Daily, Netta Cedars, MD, 1 tablet at 10/12/16 [phone] .  ondansetron (ZOFRAN) tablet 4 mg, 4 mg, Oral, Q6H PRN **OR** ondansetron (ZOFRAN) injection 4 mg, 4 mg, Intravenous, Q6H PRN, Ainsley Spinner, PA-C .  oxyCODONE (Oxy IR/ROXICODONE) immediate release tablet 5-15 mg, 5-15 mg, Oral, Q3H PRN, Shepperson, Kirstin, PA-C, 10 mg at 10/11/16 1306 .  polyethylene glycol (MIRALAX / GLYCOLAX) packet 17 g, 17 g, Oral, BID, Shepperson, Kirstin, PA-C, 17 g at 10/10/16 0950  Patients Current Diet: Diet regular Room service appropriate? Yes; Fluid consistency: Thin  Precautions / Restrictions Precautions Precautions: Fall Other Brace/Splint: R ankle splint Restrictions Weight Bearing Restrictions: Yes RLE Weight Bearing: Non weight bearing   Has the patient had 2 or more falls or a fall with injury in the past year?No  Prior Activity Level Community (5-7x/wk): independent, working, driving, Nurse, adult / [place] Devices/Equipment: None Home Equipment: Cane - single point, Crutches, Hand held shower head  Prior Device Use:  Indicate devices/aids used by the patient prior to current illness, exacerbation or injury? None of the above  Prior Functional Level Prior Function Level of Independence: Independent  Self Care: Did the patient need help bathing, dressing, using the toilet  or eating?  Independent  Indoor Mobility: Did the patient need assistance with walking from room to room (with or without device)? Independent  Stairs: Did the patient need assistance with internal or external stairs (with or without device)? Independent  Functional Cognition: Did the patient need help planning regular tasks such as shopping or remembering to take medications? Independent  Current Functional Level Cognition  Overall Cognitive Status: Within Functional Limits for tasks assessed Orientation Level: Oriented X4    Extremity Assessment (includes Sensation/Coordination)  Upper Extremity Assessment: Overall WFL for tasks assessed  Lower Extremity Assessment: RLE deficits/detail RLE Deficits / Details: heavy splint and ace wrap to leg, unable to lift unaided, able to wiggle toes, severe pain at times when elevating leg, reports meniscal injury as well in knee, able to perform quad set, ROM NT due to ankle pian    ADLs  Overall ADL's : Needs assistance/impaired Eating/Feeding: Set up, Sitting Grooming: Set up, Sitting Upper Body Bathing: Set up, Sitting, Supervision/ safety Lower Body Bathing: Moderate assistance, Sit to/from stand Upper Body Dressing : Set up, Supervision/safety, Sitting Lower Body Dressing: Moderate assistance, Sit to/from stand Lower Body Dressing Details (indicate cue type and reason): Reviewed dressing technique Toilet Transfer: Minimal assistance, BSC, Ambulation, Cueing for sequencing, Cueing for safety, RW Toilet Transfer Details (indicate cue type and reason): Educated on 3N1 set up at home Tub/ Shower Transfer: Walk-in shower, 3 in 1, Ambulation, Rolling walker, Min  Chief Executive Officer Details (indicate cue type and reason): Min guard for safety. Discussed different positioning for the 3N1 to accomidate home set up Functional mobility during ADLs: Rolling walker, Min guard General ADL Comments: Demonstrating progress. Discussed with pt dc options and car transfer.    Mobility  Overal bed mobility: Needs Assistance Bed Mobility: Supine to Sit Supine to sit: Supervision, HOB elevated General bed mobility comments: sitting EOB when PT arrives    Transfers  Overall transfer level: Needs assistance Equipment used: Rolling walker (2 wheeled) Transfers: Sit to/from Stand Sit to Stand: Min guard Stand pivot transfers: Min guard General transfer comment: Min guard for safety    Ambulation / Gait / Stairs / Wheelchair Mobility  Ambulation/Gait Ambulation/Gait assistance: Museum/gallery curator (Feet): 20 Feet Assistive device: Rolling walker (2 wheeled) Gait Pattern/deviations: Step-to pattern General Gait Details: good sequencing and tolerance for gait with RW Gait velocity: decreased Gait velocity interpretation: Below normal speed for age/gender Stairs: Yes Stairs assistance: Min assist Stair Management: No rails, Step to pattern, Forwards, With walker Number of Stairs: 1 General stair comments: practice 1 step with RW. Pt able to maintain NWB and perform safely with Min A for safety    Posture / Balance Dynamic Sitting Balance Sitting balance - Comments: donned plants with no LOB in sitting Balance Overall balance assessment: Needs assistance Sitting-balance support: No upper extremity supported, Feet supported Sitting balance-Leahy Scale: Good Sitting balance - Comments: donned plants with no LOB in sitting Standing balance support: Bilateral upper extremity supported, During functional activity Standing balance-Leahy Scale: Poor Standing balance comment: reliant on RW and physical assist for support for balance     Special needs/care consideration BiPAP/CPAP  N/a CPM  N/a Continuous Drip IV  N/a Dialysis n/a Life Vest  N/a Oxygen  N/a Special Bed  N/a Trach Size  N/a Wound Vac n/a Skin surgical incision with ace wrap Bowel mgmt: 10/12/2016 some diarrhea Bladder mgmt: continent Diabetic mgmt prediabetes dx    Previous Home Environment Living Arrangements: Spouse/significant other  Lives With:  Spouse Available Help at Discharge: Family, Available 24 hours/day Type of Home: House Home Layout: Two level, Able to live on main level with bedroom/bathroom Home Access: Stairs to enter Entrance Stairs-Rails: None Entrance Stairs-Number of Steps: 2 (6") Bathroom Shower/Tub: Multimedia programmer: Handicapped height Bathroom Accessibility: Yes How Accessible: Accessible via walker [place]: No  Discharge Living Setting Plans for Discharge Living Setting: Patient's home, Lives with (comment) (wife) Type of Home at Discharge: House Discharge Home Layout: Two level, Able to live on main level with bedroom/bathroom Discharge Home Access: Stairs to enter Entrance Stairs-Rails: None Entrance Stairs-Number of Steps: 2 Discharge Bathroom Shower/Tub: Walk-in shower Discharge Bathroom Toilet: Handicapped height Discharge Bathroom Accessibility: Yes How Accessible: Accessible via walker Does the patient have any problems obtaining your medications?: No  Social/Family/Support Systems Patient Roles: Spouse, Parent, 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: Earlie Server, wife and daughter, [place] Anticipated Caregiver: wife and daughter Anticipated Ambulance person Information: see above Ability/Limitations of Caregiver: wife has bad knees, daughter works Careers adviser: 24/7 Discharge Plan Discussed with Primary Caregiver: Yes Is Caregiver In Agreement with Plan?: Yes Does Caregiver/Family have Issues with Lodging/Transportation while Pt is in Rehab?:  No  Goals/Additional Needs Patient/Family Goal for Rehab: supervision with PT and OT Expected length of stay: [facility] 8- 12 days, but pt anticipates 3-4 Pt/Family Agrees to Admission and willing to participate: Yes Program Orientation Provided & Reviewed with Pt/Caregiver Including Roles  & Responsibilities: Yes  Decrease burden of Care through IP rehab admission: n/a  Possible need for SNF placement upon discharge:not anticipated  Patient Condition: This patient's condition remains as documented in the consult dated 10/10/2016, in which the Rehabilitation Physician determined and documented that the patient's condition is appropriate for intensive rehabilitative care in an inpatient rehabilitation facility. Will admit to inpatient rehab today.  Preadmission Screen Completed By:  Cleatrice Burke, 10/12/2016 11:53 AM ______________________________________________________________________   Discussed status with Dr. Posey Pronto on 10/12/2016 at  1152 and received telephone approval for admission today.  Admission Coordinator:  Cleatrice Burke, time 8333 Date 10/12/2016       Cosigned by: [name], MD at 10/12/2016 11:54 AM  Revision History

## 2016-10-12 NOTE — Progress Notes (Signed)
Pt arrived to unit with personal belongings, wife and daughter at bedside. Pt is A+ O x 4, able to make needs known, lungs clear, no cough noted, BS x 4 hyperactive, states last bm was 10/10/16 very loose after agents administered to have BM. SR up x 3, call bell in reach.

## 2016-10-12 NOTE — Discharge Instructions (Signed)
Orthopaedic Trauma Service Discharge Instructions   General Discharge Instructions  WEIGHT BEARING STATUS: Nonweightbearing Right Leg   RANGE OF MOTION/ACTIVITY: unrestricted motion of Right knee. No ankle motion yet as you are in a splint   Home exercise program as taught by therapy while in the hospital   HEP for R knee ROM- AROM, PROM. Prone exercises as well. No ROM restrictions.  Quad sets, SLR, LAQ, SAQ, heel slides, stretching, prone flexion and extension No pillows under bend of knee when at rest, ok to place under heel to help work on extension  Wound Care: do not remove splint. We will remove at first follow up visit. Do not get dressing or splint wet   PAIN MEDICATION USE AND EXPECTATIONS  You have likely been given narcotic medications to help control your pain.  After a traumatic event that results in an fracture (broken bone) with or without surgery, it is ok to use narcotic pain medications to help control one's pain.  We understand that everyone responds to pain differently and each individual patient will be evaluated on a regular basis for the continued need for narcotic medications. Ideally, narcotic medication use should last no more than 6-8 weeks (coinciding with fracture healing).   As a patient it is your responsibility as well to monitor narcotic medication use and report the amount and frequency you use these medications when you come to your office visit.   We would also advise that if you are using narcotic medications, you should take a dose prior to therapy to maximize you participation.  IF YOU ARE ON NARCOTIC MEDICATIONS IT IS NOT PERMISSIBLE TO OPERATE A MOTOR VEHICLE (MOTORCYCLE/CAR/TRUCK/MOPED) OR HEAVY MACHINERY DO NOT MIX NARCOTICS WITH OTHER CNS (CENTRAL NERVOUS SYSTEM) DEPRESSANTS SUCH AS ALCOHOL  Diet: as you were eating previously.  Can use over the counter stool softeners and bowel preparations, such as Miralax, to help with bowel movements.   Narcotics can be constipating.  Be sure to drink plenty of fluids    STOP SMOKING OR USING NICOTINE PRODUCTS!!!!  As discussed nicotine severely impairs your body's ability to heal surgical and traumatic wounds but also impairs bone healing.  Wounds and bone heal by forming microscopic blood vessels (angiogenesis) and nicotine is a vasoconstrictor (essentially, shrinks blood vessels).  Therefore, if vasoconstriction occurs to these microscopic blood vessels they essentially disappear and are unable to deliver necessary nutrients to the healing tissue.  This is one modifiable factor that you can do to dramatically increase your chances of healing your injury.    (This means no smoking, no nicotine gum, patches, etc)  DO NOT USE NONSTEROIDAL ANTI-INFLAMMATORY DRUGS (NSAID'S)  Using products such as Advil (ibuprofen), Aleve (naproxen), Motrin (ibuprofen) for additional pain control during fracture healing can delay and/or prevent the healing response.  If you would like to take over the counter (OTC) medication, Tylenol (acetaminophen) is ok.  However, some narcotic medications that are given for pain control contain acetaminophen as well. Therefore, you should not exceed more than 4000 mg of tylenol in a day if you do not have liver disease.  Also note that there are may OTC medicines, such as cold medicines and allergy medicines that my contain tylenol as well.  If you have any questions about medications and/or interactions please ask your doctor/PA or your pharmacist.      ICE AND ELEVATE INJURED/OPERATIVE EXTREMITY  Using ice and elevating the injured extremity above your heart can help with swelling and pain control.  Icing in a pulsatile fashion, such as 20 minutes on and 20 minutes off, can be followed.    Do not place ice directly on skin. Make sure there is a barrier between to skin and the ice pack.    Using frozen items such as frozen peas works well as the conform nicely to the are that  needs to be iced.  USE AN ACE WRAP OR TED HOSE FOR SWELLING CONTROL  In addition to icing and elevation, Ace wraps or TED hose are used to help limit and resolve swelling.  It is recommended to use Ace wraps or TED hose until you are informed to stop.    When using Ace Wraps start the wrapping distally (farthest away from the body) and wrap proximally (closer to the body)   Example: If you had surgery on your leg or thing and you do not have a splint on, start the ace wrap at the toes and work your way up to the thigh        If you had surgery on your upper extremity and do not have a splint on, start the ace wrap at your fingers and work your way up to the upper arm  IF YOU ARE IN A SPLINT OR CAST DO NOT [place]   If your splint gets wet for any reason please contact the office immediately. You may shower in your splint or cast as long as you keep it dry.  This can be done by wrapping in a cast cover or garbage back (or similar)  Do Not stick any thing down your splint or cast such as pencils, money, or hangers to try and scratch yourself with.  If you feel itchy take benadryl as prescribed on the bottle for itching  IF YOU ARE IN A CAM BOOT (BLACK BOOT)  You may remove boot periodically. Perform daily dressing changes as noted below.  Wash the liner of the boot regularly and wear a sock when wearing the boot. It is recommended that you sleep in the boot until told otherwise  CALL THE OFFICE WITH ANY QUESTIONS OR CONCERNS: [phone]

## 2016-10-12 NOTE — Progress Notes (Signed)
Occupational Therapy Treatment Patient Details Name: Patrick Good MRN: [id number] DOB: 03/26/1941 Today's Date: 10/12/2016    History of present illness Patrick Good is an 76 y.o. male with past medical hx of HTN, glaucoma and pre-diabetes and has been working on intentional weight loss and has lost about 30 pounds with diet, who presents to the ED with chief complaint of syncopal episode resulting in right leg injury.  Found to have R tib/fib fracture, underwent surgical fixation 10/09/16.  PMH L ankle fx 1980's, meniscal repair, HTN, hernia repair.    OT comments  Pt progressing towards established goals. Provided education on LB dressing with AE. Pt performed dressing with Min guard A for standing balance. Pt planning to dc to CIR later today. Answered all pt and family questions. Defer further OT services to CIR. Will sign off. Thank you.    Follow Up Recommendations  CIR    Equipment Recommendations  Other (comment) (Pending pt progress)    Recommendations for Other Services PT consult;Rehab consult    Precautions / Restrictions Precautions Precautions: Fall Required Braces or Orthoses: Other Brace/Splint Other Brace/Splint: R ankle splint Restrictions Weight Bearing Restrictions: Yes RLE Weight Bearing: Non weight bearing       Mobility Bed Mobility Overal bed mobility: Needs Assistance Bed Mobility: Supine to Sit     Supine to sit: Supervision;HOB elevated     General bed mobility comments: demonstrating good movement of RLE  Transfers Overall transfer level: Needs assistance Equipment used: Rolling walker (2 wheeled) Transfers: Sit to/from Stand Sit to Stand: Min guard Stand pivot transfers: Min guard       General transfer comment: Min guard for safety    Balance Overall balance assessment: Needs assistance Sitting-balance support: No upper extremity supported;Feet supported Sitting balance-Leahy Scale: Good Sitting balance - Comments: donned  plants with no LOB in sitting   Standing balance support: Bilateral upper extremity supported;During functional activity Standing balance-Leahy Scale: Fair Standing balance comment: Able to perform LB dressing in standing and maintain static standing balance                           ADL either performed or assessed with clinical judgement   ADL Overall ADL's : Needs assistance/impaired                 Upper Body Dressing : Set up;Supervision/safety;Sitting Upper Body Dressing Details (indicate cue type and reason): Pt donned shirt Lower Body Dressing: Sit to/from stand;Min guard;With adaptive equipment Lower Body Dressing Details (indicate cue type and reason): Pt donned/doffed underwear and pants with Min guard for safety in standing. Educated pt on use of AE (reacher) to donned pants. Pt demonstrated good understanding.              Functional mobility during ADLs: Rolling walker;Min guard General ADL Comments: Demonstrating. Educated pt on use of AE for dressing     Vision       Perception     Praxis      Cognition Arousal/Alertness: Awake/alert Behavior During Therapy: WFL for tasks assessed/performed;Anxious Overall Cognitive Status: Within Functional Limits for tasks assessed                                          Exercises     Shoulder Instructions       General Comments Maintained  good adherance to WB status    Pertinent Vitals/ Pain       Pain Assessment: Faces Faces Pain Scale: Hurts a little bit Pain Location: right leg Pain Descriptors / Indicators: Aching;Dull Pain Intervention(s): Monitored during session  Home Living                                   Prior Functioning/Environment              Frequency  Min 3X/week        Progress Toward Goals  OT Goals(current goals can now be found in the care plan section)  Progress towards OT goals: Progressing toward goals  Acute Rehab  OT Goals Patient Stated Goal: To return to independent OT Goal Formulation: With patient Time For Goal Achievement: 10/24/16 Potential to Achieve Goals: Good ADL Goals Pt Will Perform Grooming: with set-up;with supervision;standing Pt Will Perform Lower Body Bathing: with caregiver independent in assisting;with adaptive equipment;sit to/from stand;with min guard assist Pt Will Perform Lower Body Dressing: with min guard assist;with caregiver independent in assisting;with adaptive equipment;sit to/from stand Pt Will Transfer to Toilet: with set-up;with supervision;bedside commode;ambulating Pt Will Perform Toileting - Clothing Manipulation and hygiene: with set-up;with supervision;sit to/from stand Pt Will Perform Tub/Shower Transfer: Shower transfer;ambulating;rolling walker;with min guard assist;3 in 1  Plan Discharge plan remains appropriate    Co-evaluation                 AM-PAC PT "6 Clicks" Daily Activity     Outcome Measure   Help from another person eating meals?: None Help from another person taking care of personal grooming?: A Little Help from another person toileting, which includes using toliet, bedpan, or urinal?: A Little Help from another person bathing (including washing, rinsing, drying)?: A Lot Help from another person to put on and taking off regular upper body clothing?: A Little Help from another person to put on and taking off regular lower body clothing?: None 6 Click Score: 19    End of Session Equipment Utilized During Treatment: Rolling walker  OT Visit Diagnosis: Unsteadiness on feet (R26.81);Other abnormalities of gait and mobility (R26.89);Muscle weakness (generalized) (M62.81);Pain;History of falling (Z91.81) Pain - Right/Left: Right Pain - part of body: Leg   Activity Tolerance Patient tolerated treatment well   Patient Left with call bell/phone within reach;with family/visitor present;in chair   Nurse Communication Mobility status;Weight  bearing status;Precautions        Time: 8546-2703 OT Time Calculation (min): 15 min  Charges: OT General Charges $OT Visit: 1 Procedure OT Treatments $Self Care/Home Management : 8-22 mins  [place], OTR/L Acute Rehab Pager: [phone] Office: [place] 10/12/2016, 12:42 PM

## 2016-10-13 ENCOUNTER — Encounter (HOSPITAL_COMMUNITY): Payer: Self-pay

## 2016-10-13 ENCOUNTER — Inpatient Hospital Stay (HOSPITAL_COMMUNITY): Payer: Medicare Other | Admitting: Physical Therapy

## 2016-10-13 ENCOUNTER — Inpatient Hospital Stay (HOSPITAL_COMMUNITY): Payer: Medicare Other | Admitting: Occupational Therapy

## 2016-10-13 DIAGNOSIS — I951 Orthostatic hypotension: Secondary | ICD-10-CM

## 2016-10-13 DIAGNOSIS — S82831S Other fracture of upper and lower end of right fibula, sequela: Secondary | ICD-10-CM

## 2016-10-13 DIAGNOSIS — R7303 Prediabetes: Secondary | ICD-10-CM

## 2016-10-13 DIAGNOSIS — D62 Acute posthemorrhagic anemia: Secondary | ICD-10-CM

## 2016-10-13 DIAGNOSIS — R269 Unspecified abnormalities of gait and mobility: Secondary | ICD-10-CM

## 2016-10-13 LAB — CBC WITH DIFFERENTIAL/PLATELET
BASOS ABS: 0 10*3/uL (ref 0.0–0.1)
BASOS PCT: 0 %
EOS PCT: 5 %
Eosinophils Absolute: 0.3 10*3/uL (ref 0.0–0.7)
HCT: 30.5 % — ABNORMAL LOW (ref 39.0–52.0)
Hemoglobin: 10 g/dL — ABNORMAL LOW (ref 13.0–17.0)
LYMPHS PCT: 28 %
Lymphs Abs: 1.6 10*3/uL (ref 0.7–4.0)
MCH: 30.3 pg (ref 26.0–34.0)
MCHC: 32.8 g/dL (ref 30.0–36.0)
MCV: 92.4 fL (ref 78.0–100.0)
Monocytes Absolute: 0.4 10*3/uL (ref 0.1–1.0)
Monocytes Relative: 6 %
NEUTROS ABS: 3.4 10*3/uL (ref 1.7–7.7)
Neutrophils Relative %: 61 %
PLATELETS: 157 10*3/uL (ref 150–400)
RBC: 3.3 MIL/uL — AB (ref 4.22–5.81)
RDW: 13.5 % (ref 11.5–15.5)
WBC: 5.6 10*3/uL (ref 4.0–10.5)

## 2016-10-13 LAB — COMPREHENSIVE METABOLIC PANEL
ALBUMIN: 2.8 g/dL — AB (ref 3.5–5.0)
ALT: 20 U/L (ref 17–63)
AST: 21 U/L (ref 15–41)
Alkaline Phosphatase: 36 U/L — ABNORMAL LOW (ref 38–126)
Anion gap: 8 (ref 5–15)
BUN: 19 mg/dL (ref 6–20)
CHLORIDE: 103 mmol/L (ref 101–111)
CO2: 28 mmol/L (ref 22–32)
CREATININE: 0.94 mg/dL (ref 0.61–1.24)
Calcium: 8.4 mg/dL — ABNORMAL LOW (ref 8.9–10.3)
GFR calc Af Amer: >60 mL/min (ref >60)
GLUCOSE: 108 mg/dL — AB (ref 65–99)
POTASSIUM: 4.2 mmol/L (ref 3.5–5.1)
Sodium: 139 mmol/L (ref 135–145)
Total Bilirubin: 0.8 mg/dL (ref 0.3–1.2)
Total Protein: 5.4 g/dL — ABNORMAL LOW (ref 6.5–8.1)

## 2016-10-13 LAB — PHOSPHORUS: Phosphorus: 2.9 mg/dL (ref 2.5–4.6)

## 2016-10-13 NOTE — Progress Notes (Signed)
Occupational Therapy Session Note  Patient Details  Name: Patrick Good MRN: [id number] Date of Birth: [birth year]  Today's Date: 10/13/2016 OT Individual Time: 2493-2419 OT Individual Time Calculation (min): 27 min    Short Term Goals: Week 1:  OT Short Term Goal 1 (Week 1): Short term goals equal to LTGs based on ELOS.  Skilled Therapeutic Interventions/Progress Updates:    Pt ambulated from his room down to the nurses station with supervision adhering to the [place] precautions.  Transitioned to practicing a walk-in shower transfer with supervision, with pt hopping backwards over the edge of the simulated shower.  Discussed need for shower seat vs 3:1.  Pt will have to have his spouse or daughter measure the door opening as he brought his walker in slightly to reach the shower seat to sit.  Supervision for hopping forward out of the shower to complete session.  Pt left with PT in wheelchair.    Therapy Documentation Precautions:  Precautions Precautions: Fall Required Braces or Orthoses: Other Brace/Splint Other Brace/Splint: R ankle splint Restrictions Weight Bearing Restrictions: Yes RLE Weight Bearing: Non weight bearing  Pain: Pain Assessment Pain Assessment: 0-10 Pain Score: 2  Pain Type: Acute pain Pain Location: Leg Pain Orientation: Right Pain Descriptors / Indicators: Discomfort Pain Intervention(s): Repositioned  See Function Navigator for Current Functional Status.   Therapy/Group: Individual Therapy  Jeanna Giuffre OTR/L 10/13/2016, 4:25 PM

## 2016-10-13 NOTE — IPOC Note (Signed)
Overall Plan of Care [facility]) Patient Details Name: Patrick Good MRN: [id number] DOB: 09/22/1940  Admitting Diagnosis: Rt Tibia Shaft Fx  Hospital Problems: Active Problems:   Fibula upper end fracture   Abnormality of gait    Functional Problem List: Nursing Bowel, Edema, Endurance, Medication Management, Pain, Safety, Skin Integrity  PT Balance, Endurance, Motor, Pain  OT Balance, Pain  SLP    TR         Basic ADL's: OT Grooming, Bathing, Dressing, Toileting     Advanced  ADL's: OT Full Meal Preparation, Light Housekeeping     Transfers: PT Bed Mobility, Bed to Chair, Car, Floor, Manufacturing systems engineer, Metallurgist: PT Ambulation, Emergency planning/management officer, Stairs     Additional Impairments: OT None  SLP        TR      Anticipated Outcomes Item Anticipated Outcome  Self Feeding independent  Swallowing      Basic self-care  modified independent  Toileting  modified independent   Bathroom Transfers modified independent  Bowel/Bladder  reg bowel movements Cont x 2 LBM 10/10/2016  Transfers  mod I with LRAD  Locomotion  ambulatory at mod I level with LRAD  Communication     Cognition     Pain  less than 3/10  Safety/Judgment  remain fall free   Therapy Plan: PT Intensity: Minimum of 1-2 x/day ,45 to 90 minutes PT Frequency: 5 out of 7 days PT Duration Estimated Length of Stay: 4-7 days OT Intensity: Minimum of 1-2 x/day, 45 to 90 minutes OT Frequency: 5 out of 7 days OT Duration/Estimated Length of Stay: 5- 7 days         Team Interventions: Nursing Interventions Bowel Management, Pain Management, Medication Management, Skin Care/Wound Management  PT interventions Ambulation/gait training, Training and development officer, Community reintegration, Discharge planning, DME/adaptive equipment instruction, Functional mobility training, Pain management, Patient/family education, Psychosocial support, Stair training, Therapeutic Activities,  Therapeutic Exercise, UE/LE Strength taining/ROM, UE/LE Coordination activities, Wheelchair propulsion/positioning  OT Interventions Training and development officer, Academic librarian, Engineer, drilling, Discharge planning, Pain management, Patient/family education, Functional mobility training, Therapeutic Activities, UE/LE Coordination activities, Self Care/advanced ADL retraining, UE/LE Strength taining/ROM  SLP Interventions    TR Interventions    SW/CM Interventions Discharge Planning, Psychosocial Support, Patient/Family Education    Team Discharge Planning: Destination: PT-Home ,OT- Home , SLP-  Projected Follow-up: PT-Home health PT, OT-  None, SLP-  Projected Equipment Needs: PT-Rolling walker with 5" wheels, Wheelchair (measurements), Wheelchair cushion (measurements), OT- Tub/shower seat, 3 in 1 bedside comode, SLP-  Equipment Details: PT- , OT-  Patient/family involved in discharge planning: PT- Patient,  OT-Patient, SLP-   MD ELOS: 4-6 days. Medical Rehab Prognosis:  Excellent Assessment: 76 y.o. male with history of HTN, prediabetes who sustained a fall due to episode of dizziness followed by brief syncopal episode while mowing his lawn. He felt a snap of his ankle and sustained complex right tibial fracture with intraarticular extension. Hospitalist following for management of dehydration due to acute on chronic renal insufficiency and issues with hypotension. Syncope felt to be due to orthostasis. Dr. Marcelino Scot consulted and right knee effusion aspirated 6/6 and patient underwent IM nailing of right tibia and ORIF right ankle on 6/8 by Dr. Marcelino Scot. Post op NWB RLE with ROM of knee as tolerated. He has had issue hypotension, ABLA as well as pain control. CIR recommended due to deficits in mobility as well as decreased ability to carry out ADL tasks. Will set  goals for Mod I with PT/OT.  See Team Conference Notes for weekly updates to the plan of care

## 2016-10-13 NOTE — Progress Notes (Signed)
Physical Therapy Assessment and Plan  Patient Details  Name: Patrick Good MRN: [id number] Date of Birth: [birth year]  PT Diagnosis: Abnormality of gait, Difficulty walking, Muscle weakness and Pain in Rt lower extremity.  Rehab Potential: Excellent ELOS: 4-7 days   Today's Date: 10/13/2016 PT Individual Time: 0900-1000, 7673-4193 PT Individual Time Calculation (min): 60 min, 46 min  Problem List:  Patient Active Problem List   Diagnosis Date Noted  . Abnormality of gait   . Vitamin D insufficiency 10/12/2016  . Fall 10/12/2016  . Fibula upper end fracture 10/12/2016  . Renal insufficiency   . Post-operative pain   . Constipation due to pain medication   . Orthostasis   . Arterial hypotension   . AKI (acute kidney injury) ([place])   . Syncope   . Acute blood loss anemia   . Prediabetes   . Fracture of tibial shaft, right, closed 10/08/2016  . Closed fracture of right tibial plafond with fibula involvement 10/06/2016  . History of colonic polyps 05/14/2015  . Cramps of lower extremity 05/14/2015  . Elevated IOP 05/14/2015  . Hemorrhoid 05/14/2015  . 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 Essential (primary) hypertension 09/13/2007    Past Medical History:  Past Medical History:  Diagnosis Date  . Closed fracture of right tibial plafond with fibula involvement 10/06/2016  . 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 Fracture of tibial shaft, right, closed 10/08/2016  . History of ankle fracture 76   spiral break; left ankle  . History of chicken pox   . History of measles   . Hypertension   . Vitamin D insufficiency 10/12/2016   Past Surgical History:  Past Surgical History:  Procedure Laterality Date  . HERNIA REPAIR  1946 and 1952  . KNEE SURGERY  12/2011   Left meniscus by Dr. Wylene Simmer in [place]  . Myocardial Perfusion Scan  07/29/2011   Normal  . ORIF ANKLE FRACTURE Left 1982  . ORIF ANKLE FRACTURE Right 10/09/2016   Procedure: OPEN  REDUCTION INTERNAL FIXATION (ORIF) ANKLE FRACTURE;  Surgeon: Altamese Pekin, MD;  Location: [place];  Service: Orthopedics;  Laterality: Right;  . TIBIA IM NAIL INSERTION Right 10/09/2016   Procedure: INTRAMEDULLARY (IM) NAIL TIBIAL;  Surgeon: Altamese Plains, MD;  Location: [place];  Service: Orthopedics;  Laterality: Right;  Marland Kitchen VASECTOMY  1972    Assessment & Plan Clinical Impression: Patient is a 76 y.o.malewith history of HTN, prediabetes who sustained a fall due to episode of dizziness followed by brief syncopal episode while mowing his lawn. History taken from chart review, patient, and wife. 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 He felt a snap of his ankle and sustained complex right tibial fracture with intraarticular extension. Hospitalist following for management of dehydration due to acute on chronic renal insufficiency and issues with hypotension. Syncope felt to be due to orthostasis. Dr. Marcelino Scot consulted and right knee effusion aspirated 6/6 and patient underwent IM nailing of right tibia and ORIF right ankle on 6/8 by Dr. Marcelino Scot. Post op NWB RLE with ROM of knee as tolerated. He has had issue hypotension, ABLA as well as pain control. CIR recommended due to deficits in mobility as well as decreased ability to carry out ADL tasks. Patient transferred to CIR on 10/12/2016 .   Patient currently requires min with mobility secondary to muscle weakness and muscle joint tightness and decreased standing balance.  Prior to  hospitalization, patient was independent  with mobility and lived with Spouse in a House home.  Home access is 1Stairs to enter.  Patient will benefit from skilled PT intervention to maximize safe functional mobility and minimize fall risk for planned discharge home with intermittent assist.  Anticipate patient will benefit from follow up [facility] at discharge.  PT - End of Session Activity Tolerance: Tolerates 30+ min activity with  multiple rests Endurance Deficit: No PT Assessment Rehab Potential (ACUTE/IP ONLY): Excellent PT Patient demonstrates impairments in the following area(s): Balance;Endurance;Motor;Pain PT Transfers Functional Problem(s): Bed Mobility;Bed to Chair;Car;Floor;Furniture PT Locomotion Functional Problem(s): Ambulation;Wheelchair Mobility;Stairs PT Plan PT Intensity: Minimum of 1-2 x/day ,45 to 90 minutes PT Frequency: 5 out of 7 days PT Duration Estimated Length of Stay: 4-7 days PT Treatment/Interventions: Ambulation/gait training;Balance/vestibular training;Community reintegration;Discharge planning;DME/adaptive equipment instruction;Functional mobility training;Pain management;Patient/family education;Psychosocial support;Stair training;Therapeutic Activities;Therapeutic Exercise;UE/LE Strength taining/ROM;UE/LE Coordination activities;Wheelchair propulsion/positioning PT Transfers Anticipated Outcome(s): mod I with LRAD PT Locomotion Anticipated Outcome(s): ambulatory at mod I level with LRAD PT Recommendation Follow Up Recommendations: Home health PT Patient destination: Home Equipment Recommended: Rolling walker with 5" wheels;Wheelchair (measurements);Wheelchair cushion (measurements)  Skilled Therapeutic Intervention Session 1: Pt seen for initial evaluation and treatment. Pt sitting EOB with OT upon arrival and agreeable to PT session. Ambulation performed X 12 ft with min guard assist. Working on technique with transfers and reinforcing NWB status. Pt educated on PT goal as well as overall plan for transition to home. Pt setup with 18X20 w/c with Rt elevating leg rest during session. Following session, pt up in w/c with all needs in reach.  Session 2:  Pt up in w/c upon arrival, just finishing with OT and agreeable to PT session. Transfers: sit<>stand CGA-supervision from w/c, mat table. Car transfer performed with CGA. Pt ambulating with rw, consistent with NWB status on level and uneven  surface. Increased guard needed on uneven surface. Exercise: on mat table, working on quad sets with multimodal cues for quad activation vs glutes. Heel slides for knee flexion with max of 90 degrees by end of session. Following session, pt returned to room propelling self in w/c. Up with family present and all needs in reach.   PT Evaluation Precautions/Restrictions Precautions Precautions: Fall Required Braces or Orthoses: Other Brace/Splint Other Brace/Splint: R ankle splint Restrictions Weight Bearing Restrictions: Yes RLE Weight Bearing: Non weight bearing Pain Pain Assessment Pain Assessment: 0-10 Pain Score: 1  Pain Type: Acute pain Pain Location: Leg Pain Orientation: Right Pain Descriptors / Indicators: Discomfort Pain Onset: On-going Pain Intervention(s): Repositioned;Rest Home Living/Prior Functioning Home Living Available Help at Discharge: Family;Available 24 hours/day Type of Home: House Home Access: Stairs to enter [employer] of Steps: 1 Entrance Stairs-Rails: None Home Layout: Two level;Able to live on main level with bedroom/bathroom Alternate Level Stairs-Number of Steps: flight Bathroom Shower/Tub: Multimedia programmer: Handicapped height Additional Comments: Spouse will not be able to provide physical assistance.   Lives With: Spouse Prior Function Level of Independence: Independent with homemaking with ambulation  Able to Take Stairs?: Reciprically Driving: Yes Vocation: Part time employment Vocation Requirements: works as Clinical biochemist, primarily Higher education careers adviser. Leisure: Hobbies-no Vision/Perception  Vision - History Baseline Vision: Wears glasses only for reading Patient Visual Report: No change from baseline Vision - Assessment Eye Alignment: Within Functional Limits Perception Perception: Within Functional Limits Praxis Praxis: Intact  Cognition Overall Cognitive Status: Within Functional Limits for tasks  assessed Arousal/Alertness: Awake/alert Orientation Level: Oriented X4 Memory: Appears intact Awareness: Appears intact Problem Solving:  Appears intact Safety/Judgment: Appears intact Sensation Sensation Light Touch: Appears Intact Stereognosis: Appears Intact Proprioception: Appears Intact Coordination Gross Motor Movements are Fluid and Coordinated: Yes Fine Motor Movements are Fluid and Coordinated: Yes Motor  Motor Motor: Within Functional Limits  Mobility Transfers Sit to Stand: 5: Supervision;With upper extremity assist;With armrests;From toilet Stand to Sit: 5: Supervision;With upper extremity assist;With armrests    Trunk/Postural Assessment  Cervical Assessment Cervical Assessment: Within Functional Limits Thoracic Assessment Thoracic Assessment: Within Functional Limits Lumbar Assessment Lumbar Assessment: Within Functional Limits Postural Control Postural Control: Within Functional Limits  Balance Balance Balance Assessed: Yes Static Sitting Balance Static Sitting - Balance Support: No upper extremity supported Static Sitting - Level of Assistance: 7: Independent Dynamic Sitting Balance Dynamic Sitting - Balance Support: No upper extremity supported Dynamic Sitting - Level of Assistance: 7: Independent Static Standing Balance Static Standing - Balance Support: Bilateral upper extremity supported Static Standing - Level of Assistance: 5: Stand by assistance Dynamic Standing Balance Dynamic Standing - Balance Support: Bilateral upper extremity supported Dynamic Standing - Level of Assistance: 5: Stand by assistance Extremity Assessment  RUE Assessment RUE Assessment: Within Functional Limits LUE Assessment LUE Assessment: Within Functional Limits RLE Assessment RLE Assessment: Exceptions to [facility] RLE Strength RLE Overall Strength Comments: able to perform SLR, Rt knee flexion 30 degrees, extension 5 degrees.  LLE Assessment LLE Assessment:  (grossly  5/5)   See Function Navigator for Current Functional Status.   Refer to Care Plan for Long Term Goals  Recommendations for other services: None   Discharge Criteria: Patient will be discharged from PT if patient refuses treatment 3 consecutive times without medical reason, if treatment goals not met, if there is a change in medical status, if patient makes no progress towards goals or if patient is discharged from hospital.  The above assessment, treatment plan, treatment alternatives and goals were discussed and mutually agreed upon: by patient  Linard Millers, PT 10/13/2016, 12:43 PM

## 2016-10-13 NOTE — Evaluation (Signed)
Occupational Therapy Assessment and Plan  Patient Details  Name: Patrick Good MRN: [id number] Date of Birth: Jan 27, 1941  OT Diagnosis: acute pain and muscle weakness (generalized) Rehab Potential: Rehab Potential (ACUTE ONLY): Excellent ELOS: 5- 7 days   Today's Date: 10/13/2016 OT Individual Time: 0800-0905 OT Individual Time Calculation (min): 65 min     Problem List:  Patient Active Problem List   Diagnosis Date Noted  . Vitamin D insufficiency 10/12/2016  . Fall 10/12/2016  . Fibula upper end fracture 10/12/2016  . Renal insufficiency   . Post-operative pain   . Constipation due to pain medication   . Orthostasis   . Arterial hypotension   . AKI (acute kidney injury) ([place])   . Syncope   . Acute blood loss anemia   . Prediabetes   . Fracture of tibial shaft, right, closed 10/08/2016  . Closed fracture of right tibial plafond with fibula involvement 10/06/2016  . History of colonic polyps 05/14/2015  . Cramps of lower extremity 05/14/2015  . Elevated IOP 05/14/2015  . Hemorrhoid 05/14/2015  . 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 Essential (primary) hypertension 09/13/2007    Past Medical History:  Past Medical History:  Diagnosis Date  . Closed fracture of right tibial plafond with fibula involvement 10/06/2016  . 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 Fracture of tibial shaft, right, closed 10/08/2016  . History of ankle fracture 1982   spiral break; left ankle  . History of chicken pox   . History of measles   . Hypertension   . Vitamin D insufficiency 10/12/2016   Past Surgical History:  Past Surgical History:  Procedure Laterality Date  . HERNIA REPAIR  1946 and 1952  . KNEE SURGERY  12/2011   Left meniscus by Dr. Wylene Simmer in [place]  . Myocardial Perfusion Scan  07/29/2011   Normal  . ORIF ANKLE FRACTURE Left 1982  . ORIF ANKLE FRACTURE Right 10/09/2016   Procedure: OPEN REDUCTION INTERNAL FIXATION (ORIF) ANKLE FRACTURE;   Surgeon: Altamese Summerdale, MD;  Location: [facility];  Service: Orthopedics;  Laterality: Right;  . TIBIA IM NAIL INSERTION Right 10/09/2016   Procedure: INTRAMEDULLARY (IM) NAIL TIBIAL;  Surgeon: Altamese Olivia Lopez de Gutierrez, MD;  Location: [facility];  Service: Orthopedics;  Laterality: Right;  Marland Kitchen VASECTOMY  1972    Assessment & Plan Clinical Impression: Patient is a 76 y.o. year old male with recent admission to the hospital on  Pt admitted 6/5 with sustained a fall due to episode of dizziness followed by brief syncopal episode while mowing his lawn. History taken from chart review, patient, and wife. 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 He felt a snap of his ankle and sustained complex right tibial fracture with intraarticular extension. Hospitalist following for management of dehydration due to acute on chronic renal insufficiency and issues with hypotension. Syncope felt to be due to orthostasis. Dr. Marcelino Scot consulted and right knee effusion aspirated 6/6 and patient underwent IM nailing of right tibia and ORIF right ankle on 6/8 by Dr. Marcelino Scot Patient transferred to [facility] on 10/12/2016 .    Patient currently requires supervision with basic self-care skills secondary to muscle weakness and decreased standing balance and decreased balance strategies.  Prior to hospitalization, patient could complete ADLs with independent .  Patient will benefit from skilled intervention to decrease level of assist with basic self-care skills and increase independence with basic self-care skills prior to discharge  home with care partner.  Anticipate patient will reach modified independent level.  OT - End of Session Activity Tolerance: Improving Endurance Deficit: No OT Assessment Rehab Potential (ACUTE ONLY): Excellent OT Patient demonstrates impairments in the following area(s): Balance;Pain OT Basic ADL's Functional Problem(s): Grooming;Bathing;Dressing;Toileting OT Advanced ADL's  Functional Problem(s): Full Meal Preparation;Light Housekeeping OT Transfers Functional Problem(s): Toilet;Tub/Shower OT Additional Impairment(s): None OT Plan OT Intensity: Minimum of 1-2 x/day, 45 to 90 minutes OT Frequency: 5 out of 7 days OT Duration/Estimated Length of Stay: 5- 7 days OT Treatment/Interventions: Balance/vestibular training;Community reintegration;DME/adaptive equipment instruction;Discharge planning;Pain management;Patient/family education;Functional mobility training;Therapeutic Activities;UE/LE Coordination activities;Self Care/advanced ADL retraining;UE/LE Strength taining/ROM OT Self Feeding Anticipated Outcome(s): independent OT Basic Self-Care Anticipated Outcome(s): modified independent OT Toileting Anticipated Outcome(s): modified independent OT Bathroom Transfers Anticipated Outcome(s): modified independent OT Recommendation Patient destination: Home Follow Up Recommendations: None Equipment Recommended: Tub/shower seat;3 in 1 bedside comode   Skilled Therapeutic Intervention Pt began education of selfcare retraining sit to stand on the EOB while maintaining NWBing precautions.  He was able to complete all bathing and dressing sit to stand with supervision.  Toilet transfers from bed to 3:1 with supervision.  Pt with report of pain at 2/10 throughout session.  Discussed ELOS and goal expectations at end of session.  Pt left sitting EOB with pt present for next session.   OT Evaluation Precautions/Restrictions  Precautions Precautions: Fall Required Braces or Orthoses: Other Brace/Splint Other Brace/Splint: R ankle splint Restrictions Weight Bearing Restrictions: Yes RLE Weight Bearing: Non weight bearing  Pain Pain Assessment Pain Assessment: 0-10 Pain Score: 2  Pain Type: Acute pain Pain Location: Leg Pain Orientation: Right Pain Descriptors / Indicators: Discomfort Pain Onset: On-going Pain Intervention(s): Repositioned;Emotional support Home  Living/Prior Functioning Home Living Family/patient expects to be discharged to:: Private residence Living Arrangements: Spouse/significant other Available Help at Discharge: Family, Available 24 hours/day Type of Home: House Home Access: Stairs to enter [employer] of Steps: 1 Entrance Stairs-Rails: None Home Layout: Two level, Able to live on main level with bedroom/bathroom Alternate Level Stairs-Number of Steps: flight Bathroom Shower/Tub: Multimedia programmer: Handicapped height Additional Comments: Spouse will not be able to provide physical assistance.   Lives With: Spouse IADL History Homemaking Responsibilities: Yes Cleaning Responsibility: Primary Current License: Yes Mode of Transportation: Car Occupation: Part time employment Prior Function Level of Independence: Independent with basic ADLs  Able to Take Stairs?: Reciprically Driving: Yes Vocation: Part time employment Vocation Requirements: works as Clinical biochemist, primarily Higher education careers adviser. Leisure: Hobbies-no ADL  See Function Section of chart for details  Vision Baseline Vision/History: Wears glasses Wears Glasses: Reading only Patient Visual Report: No change from baseline Vision Assessment?: No apparent visual deficits Eye Alignment: Within Functional Limits Perception  Perception: Within Functional Limits Praxis Praxis: Intact Cognition Overall Cognitive Status: Within Functional Limits for tasks assessed Arousal/Alertness: Awake/alert Orientation Level: Person;Place;Situation Person: Oriented Place: Oriented Situation: Oriented Year: 2018 Month: June Day of Week: Correct Memory: Appears intact Immediate Memory Recall: Sock;Blue;Bed Memory Recall: Sock;Blue;Bed Memory Recall Sock: With Cue Memory Recall Blue: With Cue Memory Recall Bed: With Cue Awareness: Appears intact Problem Solving: Appears intact Safety/Judgment: Appears intact Sensation Sensation Light  Touch: Appears Intact Stereognosis: Appears Intact Proprioception: Appears Intact Coordination Gross Motor Movements are Fluid and Coordinated: Yes Fine Motor Movements are Fluid and Coordinated: Yes Motor  Motor Motor: Within Functional Limits Mobility  Transfers Transfers: Sit to Stand;Stand to Sit Sit to Stand: 5: Supervision;With upper extremity assist;With armrests;From toilet Stand to Sit:  5: Supervision;With upper extremity assist;With armrests  Trunk/Postural Assessment  Cervical Assessment Cervical Assessment: Within Functional Limits Thoracic Assessment Thoracic Assessment: Within Functional Limits Lumbar Assessment Lumbar Assessment: Within Functional Limits Postural Control Postural Control: Within Functional Limits  Balance Balance Balance Assessed: Yes Static Sitting Balance Static Sitting - Balance Support: No upper extremity supported Static Sitting - Level of Assistance: 7: Independent Dynamic Sitting Balance Dynamic Sitting - Balance Support: No upper extremity supported Dynamic Sitting - Level of Assistance: 7: Independent Static Standing Balance Static Standing - Balance Support: Bilateral upper extremity supported Static Standing - Level of Assistance: 5: Stand by assistance Dynamic Standing Balance Dynamic Standing - Balance Support: Bilateral upper extremity supported;During functional activity Dynamic Standing - Level of Assistance: 5: Stand by assistance Extremity/Trunk Assessment RUE Assessment RUE Assessment: Within Functional Limits LUE Assessment LUE Assessment: Within Functional Limits   See Function Navigator for Current Functional Status.   Refer to Care Plan for Long Term Goals  Recommendations for other services: None    Discharge Criteria: Patient will be discharged from OT if patient refuses treatment 3 consecutive times without medical reason, if treatment goals not met, if there is a change in medical status, if patient makes  no progress towards goals or if patient is discharged from hospital.  The above assessment, treatment plan, treatment alternatives and goals were discussed and mutually agreed upon: by patient  Legacy Lacivita OTR/L 10/13/2016, 9:48 AM

## 2016-10-13 NOTE — Progress Notes (Signed)
Patient information reviewed and entered into eRehab system by Naomee Nowland, RN, CRRN, PPS Coordinator.  Information including medical coding and functional independence measure will be reviewed and updated through discharge.     Per nursing patient was given "Data Collection Information Summary for Patients in Inpatient Rehabilitation Facilities with attached "Privacy Act Statement-Health Care Records" upon admission.  

## 2016-10-13 NOTE — Progress Notes (Signed)
[facility] PHYSICAL MEDICINE & REHABILITATION     PROGRESS NOTE  Subjective/Complaints:  Pt seen laying in bed this AM.  He slept well overnight, "except for everyone coming in and out of (my) room".  He is upset this AM because he did not get his schedule from the previous day and what appears to be lack of communication prior to rehab.  ROS: Denies CP, SOB, N/V/D.  Objective: Vital Signs: Blood pressure 133/67, pulse 77, temperature 98.9 F (37.2 C), temperature source Oral, resp. rate 17, height 6\' 3"  (1.905 m), weight 110.1 kg (242 lb 12.8 oz), SpO2 99 %. No results found.  Recent Labs  10/12/16 0234 10/13/16 0514  WBC 6.0 5.6  HGB 9.6* 10.0*  HCT 28.9* 30.5*  PLT 134* 157    Recent Labs  10/12/16 0234 10/13/16 0514  NA 140 139  K 3.8 4.2  CL 106 103  GLUCOSE 114* 108*  BUN 23* 19  CREATININE 1.03 0.94  CALCIUM 8.2* 8.4*   CBG (last 3)  No results for input(s): GLUCAP in the last 72 hours.  Wt Readings from Last 3 Encounters:  10/12/16 110.1 kg (242 lb 12.8 oz)  10/06/16 114.8 kg (253 lb)  10/02/16 114.8 kg (253 lb)    Physical Exam:  BP 133/67 (BP Location: Left Arm)   Pulse 77   Temp 98.9 F (37.2 C) (Oral)   Resp 17   Ht 6\' 3"  (1.905 m)   Wt 110.1 kg (242 lb 12.8 oz)   SpO2 99%   BMI 30.35 kg/m  Constitutional: He appears well-developed and well-nourished. NAD.  HENT: Normocephalic and atraumatic.  Eyes: EOMI. No discharge.  Cardiovascular: Normal rate and rhythm.  No JVD. Respiratory: Effort normal and breath sounds normal.  GI: Soft. Bowel sounds are normal.  Musculoskeletal: He exhibits edema and tenderness.  Neurological: He is alert and oriented.  Sensation intact to light touch Motor: B/l UE, LLE 5/5 proximal to distal LLE: HF 4+/5, knee and ankle dressed. Wiggles toes.  Skin: Skin is warm and dry. RLE dressing c/d/i  Assessment/Plan: 1. Functional deficits secondary to right tibial shaft fracture and knee effusion which require 3+  hours per day of interdisciplinary therapy in a comprehensive inpatient rehab setting. Physiatrist is providing close team supervision and 24 hour management of active medical problems listed below. Physiatrist and rehab team continue to assess barriers to discharge/monitor patient progress toward functional and medical goals.  Function:  Bathing Bathing position   Position: Sitting EOB  Bathing parts Body parts bathed by patient: Right arm, Left arm, Chest, Abdomen, Front perineal area, Buttocks, Right upper leg, Left upper leg, Left lower leg, Back    Bathing assist        Upper Body Dressing/Undressing Upper body dressing   What is the patient wearing?: Pull over shirt/dress     Pull over shirt/dress - Perfomed by patient: Thread/unthread right sleeve, Thread/unthread left sleeve, Put head through opening, Pull shirt over trunk          Upper body assist Assist Level: Supervision or verbal cues      Lower Body Dressing/Undressing Lower body dressing   What is the patient wearing?: Non-skid slipper socks, Pants, Underwear Underwear - Performed by patient: Thread/unthread right underwear leg, Thread/unthread left underwear leg, Pull underwear up/down   Pants- Performed by patient: Thread/unthread left pants leg, Pull pants up/down   Non-skid slipper socks- Performed by patient: Don/doff left sock  Lower body assist Assist for lower body dressing: Supervision or verbal cues      Toileting Toileting   Toileting steps completed by patient: Adjust clothing prior to toileting, Performs perineal hygiene, Adjust clothing after toileting      Toileting assist Assist level: Supervision or verbal cues   Transfers Chair/bed transfer   Chair/bed transfer method: Stand pivot Chair/bed transfer assist level: Supervision or verbal cues       Locomotion Ambulation           Wheelchair          Cognition Comprehension Comprehension assist  level: Follows complex conversation/direction with no assist  Expression Expression assist level: Expresses complex ideas: With no assist  Social Interaction Social Interaction assist level: Interacts appropriately with others - No medications needed.  Problem Solving Problem solving assist level: Solves complex problems: Recognizes & self-corrects  Memory Memory assist level: Complete Independence: No helper    Medical Problem List and Plan: 1.  Deficits in mobility as well as decreased ability to carry out ADL tasks secondary to right tibial shaft fracture and knee effusion.  Begin CIR 2.  DVT Prophylaxis/Anticoagulation: Pharmaceutical: Lovenox 3. Pain Management: Continue oxycodone prn 4. Mood: LCSW to follow for evaluation and support.  5. Neuropsych: This patient is capable of making decisions on his own behalf. 6. Skin/Wound Care: Monitor wound for healing. Maintain adequate and hydration status. Added supplements to promote wound healing and as intake poor 7. Fluids/Electrolytes/Nutrition: Monitor I/Os  BMP within acceptable range on 6/12 8. Dizziness due to hypotension:   Orthostasis  Encourage fluids, TEDs, abdominal binder as needed 9. Pre-diabetes: Hgb A1c- 5.7. Has lost 40 lbs in past few months. Will have dietitian educate on CM diet and moderation.   Monitor with increased mobility 10. AKI: Resolved  BUN/SCr 37/1.65 at admission.   May need IVF for hydration.   Cr 0.94 on 6/12  Cont to monitor 11. ABLA: Added iron supplement.  Hb 10.0 on 6/12  Cont to monitor 12. Constipation: Continue miralax--may need enema. Discontinued colace due to liquid stools.  13. Thrombocytopenia: Resolved  Monitor for signs of bleeding.   Plts 157 on 6/12 14. Protein calorie malnutrition: Supplements added.    LOS (Days) 1 A FACE TO FACE EVALUATION WAS PERFORMED  Ankit Lorie Phenix 10/13/2016 10:08 AM

## 2016-10-13 NOTE — Plan of Care (Signed)
Problem: Food- and Nutrition-Related Knowledge Deficit (NB-1.1) Goal: Nutrition education Formal process to instruct or train a patient/client in a skill or to impart knowledge to help patients/clients voluntarily manage or modify food choices and eating behavior to maintain or improve health. Outcome: Completed/Met Date Met: 10/13/16  RD consulted for nutrition education regarding Pre-diabetes.   Lab Results  Component Value Date   HGBA1C 5.7 (H) 10/08/2016    RD provided "Carbohydrate Counting for People with Diabetes" handout from the Academy of Nutrition and Dietetics. Pt reports he was following a ketogenic diet at home with consuming very little to no carbohydrates. Pt does report he has been feeling dizzy when getting up frequently. Discussed different food groups and their effects on blood sugar, emphasizing carbohydrate-containing foods. Provided list of carbohydrates and recommended serving sizes of common foods. Recommended consuming ~120 grams of carbohydrates a day for adequate nutrition and to decrease bouts of dizziness.  Discussed importance of controlled and consistent carbohydrate intake throughout the day. Provided examples of ways to balance meals/snacks and encouraged intake of high-fiber, whole grain complex carbohydrates. Teach back method used.  Expect good compliance.  Body mass index is 30.35 kg/m. Pt meets criteria for class I obesity based on current BMI.  Current diet order is regular, patient is consuming approximately 75-100% of meals at this time. Labs and medications reviewed. Pt currently has Glucerna shake ordered and would like to continue with them. No further nutrition interventions warranted at this time. RD contact information provided. If additional nutrition issues arise, please re-consult RD.  Corrin Parker, MS, RD, LDN Pager # [phone] After hours/ weekend pager # [phone]

## 2016-10-14 ENCOUNTER — Inpatient Hospital Stay (HOSPITAL_COMMUNITY): Payer: Medicare Other | Admitting: Physical Therapy

## 2016-10-14 ENCOUNTER — Inpatient Hospital Stay (HOSPITAL_COMMUNITY): Payer: Medicare Other | Admitting: Occupational Therapy

## 2016-10-14 DIAGNOSIS — S82871D Displaced pilon fracture of right tibia, subsequent encounter for closed fracture with routine healing: Secondary | ICD-10-CM

## 2016-10-14 DIAGNOSIS — S82201D Unspecified fracture of shaft of right tibia, subsequent encounter for closed fracture with routine healing: Principal | ICD-10-CM

## 2016-10-14 DIAGNOSIS — S82831D Other fracture of upper and lower end of right fibula, subsequent encounter for closed fracture with routine healing: Secondary | ICD-10-CM

## 2016-10-14 LAB — RENAL FUNCTION PANEL
ALBUMIN: 2.9 g/dL — AB (ref 3.5–5.0)
ANION GAP: 7 (ref 5–15)
BUN: 19 mg/dL (ref 6–20)
CALCIUM: 8.2 mg/dL — AB (ref 8.9–10.3)
CO2: 28 mmol/L (ref 22–32)
Chloride: 104 mmol/L (ref 101–111)
Creatinine, Ser: 0.94 mg/dL (ref 0.61–1.24)
GFR calc Af Amer: >60 mL/min (ref >60)
GLUCOSE: 110 mg/dL — AB (ref 65–99)
PHOSPHORUS: 3.3 mg/dL (ref 2.5–4.6)
Potassium: 3.9 mmol/L (ref 3.5–5.1)
SODIUM: 139 mmol/L (ref 135–145)

## 2016-10-14 NOTE — Progress Notes (Signed)
Occupational Therapy Session Note  Patient Details  Name: Patrick Good MRN: [id number] Date of Birth: [birth year]  Today's Date: 10/14/2016 OT Individual Time: 0904-1003 OT Individual Time Calculation (min): 59 min    Short Term Goals: Week 1:  OT Short Term Goal 1 (Week 1): Short term goals equal to LTGs based on ELOS.  Skilled Therapeutic Interventions/Progress Updates:    Pt presented supine in bed ready for OT tx session. Pt completing bed mobility and stand pivot transfer to w/c with RW and close supervision for safety and with Pt adhering to NWB precautions during transfer. Pt completing UB dressing seated EOB with setup, completed grooming ADLs at sink from w/c level with setup. Pt self propelled w/c to/from rehab apartment using UEs. Completed dynamic standing activity working at [employer] in [facility], reaching for and obtaining items and washing dishes using kitchen counter for UE support and with close supervision for safety. Pt then completed additional dynamic standing activity reaching outside BOS to both L and R to obtain and stack cups with single UE support and with supervision/intermittent MinA to steady. Session included discussion on POC and preparation for return home with Pt reporting concerns about spouse getting w/c into the home. Discussed potential options with Pt and education provided on using anti tippers to increase ease of propelling w/c onto step to enter home. Ended session with Pt sitting EOB, NT present in room, call bell and needs within reach.   Therapy Documentation Precautions:  Precautions Precautions: Fall Required Braces or Orthoses: Other Brace/Splint Other Brace/Splint: R ankle splint Restrictions Weight Bearing Restrictions: Yes RLE Weight Bearing: Non weight bearing General:   Vital Signs: Oxygen Therapy O2 Device: Not Delivered Pain: Pain Assessment Pain Assessment: Faces Pain Score: 0-No pain Faces Pain Scale: No hurt  See  Function Navigator for Current Functional Status.   Therapy/Group: Individual Therapy  Raymondo Band 10/14/2016, 2:47 PM

## 2016-10-14 NOTE — Progress Notes (Signed)
Physical Therapy Session Note  Patient Details  Name: Patrick Good MRN: [id number] Date of Birth: April 16, 1941  Today's Date: 10/14/2016 PT Individual Time: 1100-1200 PT Individual Time Calculation (min): 60 min   Short Term Goals: Week 1:  PT Short Term Goal 1 (Week 1): STG=LTG due to ELOS  Skilled Therapeutic Interventions/Progress Updates:   Session 1:  Pt in bed upon arrival, agreeable to PT session. Reviewing w/c parts management for pt and for pt to instruct second person if needed for assistance and loading/unloading. Bed mobility: supervision supine<>sitting, Transfers: sit<>stand supervision from bed, w/c, mat table. Ambulation: 75 ft X1, 60 ft X1 using rw and supervision. Pt requesting decreased distance due to UE soreness after yesterday. Exercise: supine: quad sets, heel slides, hip abd/add, SAQ, SLR, seated LAQ - all 1X10. Pt propelling w/c in hall with supervision and cues for parts management with leg rests. Following session, pt requesting to return to bed to elevate LE. Pt in bed with spouse present and all needs in reach.  Session 2: Pt in bed upon arrival, agreeable to PT session. Working with pt and spouse on education on w/c parts, use, safety, transport, use in community. Spouse educated on parts management as well as reviewed by pt. Pt ambulating 50 ft with rw and supervision, propelling w/c X60 ft with supervision. Standing at counter, working on reaching into cupboards (upper/lower) as well as simulating refrigerator use and loading dishwasher. Following session, pt up in w/c in room with spouse and all needs in reach.   Therapy Documentation Precautions:  Precautions Precautions: Fall Required Braces or Orthoses: Other Brace/Splint Other Brace/Splint: R ankle splint Restrictions Weight Bearing Restrictions: Yes RLE Weight Bearing: Non weight bearing    Pain:  1/10 monitored during session.   See Function Navigator for Current Functional  Status.   Therapy/Group: Individual Therapy  Linard Millers, PT 10/14/2016, 12:12 PM

## 2016-10-14 NOTE — Plan of Care (Signed)
Problem: Consults Goal: [facility] GENERAL PATIENT EDUCATION See Patient Education module for education specifics.  Outcome: Progressing Will need to be taught on Lovenox when administered at 2000

## 2016-10-14 NOTE — Progress Notes (Signed)
[facility] [facility] PHYSICAL MEDICINE & REHABILITATION     PROGRESS NOTE  Subjective/Complaints:  Pt seen laying in bed this AM.  He slept well overnight and states he feels much better this AM.  He states he over exerted himself in therapies yesterday, but learned a lot.   ROS: Denies CP, SOB, N/V/D.  Objective: Vital Signs: Blood pressure (!) 123/55, pulse 72, temperature 97.8 F (36.6 C), temperature source Oral, resp. rate 18, height 6\' 3"  (1.905 m), weight 104.7 kg (230 lb 14.4 oz), SpO2 99 %. No results found.  Recent Labs  10/12/16 0234 10/13/16 0514  WBC 6.0 5.6  HGB 9.6* 10.0*  HCT 28.9* 30.5*  PLT 134* 157    Recent Labs  10/13/16 0514 10/14/16 0538  NA 139 139  K 4.2 3.9  CL 103 104  GLUCOSE 108* 110*  BUN 19 19  CREATININE 0.94 0.94  CALCIUM 8.4* 8.2*   CBG (last 3)  No results for input(s): GLUCAP in the last 72 hours.  Wt Readings from Last 3 Encounters:  10/14/16 104.7 kg (230 lb 14.4 oz)  10/06/16 114.8 kg (253 lb)  10/02/16 114.8 kg (253 lb)    Physical Exam:  BP (!) 123/55 (BP Location: Left Arm)   Pulse 72   Temp 97.8 F (36.6 C) (Oral)   Resp 18   Ht 6\' 3"  (1.905 m)   Wt 104.7 kg (230 lb 14.4 oz)   SpO2 99%   BMI 28.86 kg/m  Constitutional: He appears well-developed and well-nourished. NAD.  HENT: Normocephalic and atraumatic.  Eyes: EOMI. No discharge.  Cardiovascular: RRR.  No JVD. Respiratory: Effort normal and breath sounds normal.  GI: Soft. Bowel sounds are normal.  Musculoskeletal: He exhibits edema and tenderness.  Neurological: He is alert and oriented.  Sensation intact to light touch Motor: B/l UE, LLE 5/5 proximal to distal LLE: HF 4+/5, knee and ankle dressed. Wiggles toes (unchanged)  Skin: Skin is warm and dry. RLE dressing c/d/i  Assessment/Plan: 1. Functional deficits secondary to right tibial shaft fracture and knee effusion which require 3+ hours per day of interdisciplinary therapy in a comprehensive inpatient rehab  setting. Physiatrist is providing close team supervision and 24 hour management of active medical problems listed below. Physiatrist and rehab team continue to assess barriers to discharge/monitor patient progress toward functional and medical goals.  Function:  Bathing Bathing position   Position: Sitting EOB  Bathing parts Body parts bathed by patient: Right arm, Left arm, Chest, Abdomen, Front perineal area, Buttocks, Right upper leg, Left upper leg, Left lower leg, Back    Bathing assist        Upper Body Dressing/Undressing Upper body dressing   What is the patient wearing?: Pull over shirt/dress     Pull over shirt/dress - Perfomed by patient: Thread/unthread right sleeve, Thread/unthread left sleeve, Put head through opening, Pull shirt over trunk          Upper body assist Assist Level: Supervision or verbal cues      Lower Body Dressing/Undressing Lower body dressing   What is the patient wearing?: Non-skid slipper socks, Pants, Underwear Underwear - Performed by patient: Thread/unthread right underwear leg, Thread/unthread left underwear leg, Pull underwear up/down   Pants- Performed by patient: Thread/unthread left pants leg, Pull pants up/down   Non-skid slipper socks- Performed by patient: Don/doff left sock                    Lower body assist Assist for  lower body dressing: Supervision or verbal cues      Toileting Toileting   Toileting steps completed by patient: Adjust clothing prior to toileting, Performs perineal hygiene, Adjust clothing after toileting      Toileting assist Assist level: Supervision or verbal cues   Transfers Chair/bed transfer   Chair/bed transfer method: Ambulatory Chair/bed transfer assist level: Supervision or verbal cues Chair/bed transfer assistive device: Medical sales representative     Max distance: 100 ft Assist level: Supervision or verbal cues   Wheelchair   Type: Manual Max wheelchair distance:  130 ft Assist Level: Supervision or verbal cues  Cognition Comprehension Comprehension assist level: Follows complex conversation/direction with no assist  Expression Expression assist level: Expresses complex ideas: With no assist  Social Interaction Social Interaction assist level: Interacts appropriately with others - No medications needed.  Problem Solving Problem solving assist level: Solves complex problems: Recognizes & self-corrects  Memory Memory assist level: Complete Independence: No helper    Medical Problem List and Plan: 1.  Deficits in mobility as well as decreased ability to carry out ADL tasks secondary to right tibial shaft fracture and knee effusion.  Cont CIR 2.  DVT Prophylaxis/Anticoagulation: Pharmaceutical: Lovenox 3. Pain Management: Continue oxycodone prn 4. Mood: LCSW to follow for evaluation and support.  5. Neuropsych: This patient is capable of making decisions on his own behalf. 6. Skin/Wound Care: Monitor wound for healing. Maintain adequate and hydration status. Added supplements to promote wound healing and as intake poor 7. Fluids/Electrolytes/Nutrition: Monitor I/Os  BMP within acceptable range on 6/13 8. Dizziness due to hypotension:   Orthostasis, but improving  Encourage fluids, TEDs, abdominal binder as needed 9. Pre-diabetes: Hgb A1c- 5.7. Has lost 40 lbs in past few months. Will have dietitian educate on CM diet and moderation.   Overall controlled 6/13 10. AKI: Resolved  Cont to monitor 11. ABLA: Added iron supplement.  Hb 10.0 on 6/12  Cont to monitor 12. Constipation: Continue miralax--may need enema. Discontinued colace due to liquid stools.  13. Thrombocytopenia: Resolved  Monitor for signs of bleeding.   Plts 157 on 6/12 14. Protein calorie malnutrition: Supplements added.    LOS (Days) 2 A FACE TO FACE EVALUATION WAS PERFORMED  Ankit Lorie Phenix 10/14/2016 9:10 AM

## 2016-10-14 NOTE — Progress Notes (Signed)
Occupational Therapy Session Note  Patient Details  Name: Patrick Good MRN: [id number] Date of Birth: 18-Nov-1940  Today's Date: 10/14/2016 OT Individual Time: 3559-7416 OT Individual Time Calculation (min): 53 min    Short Term Goals: Week 1:  OT Short Term Goal 1 (Week 1): Short term goals equal to LTGs based on ELOS.  Skilled Therapeutic Interventions/Progress Updates:    Pt completed functional mobility half way to the ADL apartment with supervision before requesting to finish the rest of the way by pushing the wheelchair.  Once in the apartment, had pt work on bed transfers and home management task of making up the bed.  He demonstrated one LOB posteriorly when attempting to hop backwards to retrieve sheet for making up the bed, requiring min assist to correct his LOB.  Returned to room at end of session.  Pt's spouse present at end of session.  She provided pictures of possible DME needs including shower seat with handles and bars to go around the elevated toilet.       Therapy Documentation Precautions:  Precautions Precautions: Fall Required Braces or Orthoses: Other Brace/Splint Other Brace/Splint: R ankle splint Restrictions Weight Bearing Restrictions: Yes RLE Weight Bearing: Non weight bearing  Pain: Pain Assessment Pain Assessment: Faces Pain Score: 0-No pain Faces Pain Scale: No hurt Pain Type: Acute pain Pain Location: Leg Pain Orientation: Right Pain Onset: With Activity Pain Intervention(s): Repositioned;Emotional support ADL: See Function Navigator for Current Functional Status.   Therapy/Group: Individual Therapy  Alonnah Lampkins OTR/L 10/14/2016, 4:06 PM

## 2016-10-14 NOTE — Discharge Summary (Signed)
Physician Discharge Summary  Patient ID: Patrick Good MRN: [id number] DOB/AGE: 09-Jun-1940 76 y.o.  Admit date: 10/12/2016 Discharge date: 10/17/2016  Discharge Diagnoses:  Principal Problem:   Closed fracture of right tibial plafond with fibula involvement Active Problems:   AKI (acute kidney injury) [facility])   Acute blood loss anemia   Prediabetes   Vitamin D insufficiency   Abnormality of gait   Elevated BUN   Discharged Condition:  Improved   Significant Diagnostic Studies: N/A   Labs:  Basic Metabolic Panel:  Recent Labs Lab 10/10/16 0419 10/11/16 0236 10/12/16 0234 10/13/16 0514 10/14/16 0538 10/15/16 0516 10/16/16 0603  NA 140  139 136  138 140 139 139 139 139  K 4.3  4.2 4.1  4.3 3.8 4.2 3.9 4.2 4.5  CL 101  100* 102  103 106 103 104 102 104  CO2 31  30 29  29 28 28 28 31 28   GLUCOSE 122*  118* 119*  122* 114* 108* 110* 115* 118*  BUN 14  14 24*  24* 23* 19 19 23* 20  CREATININE 1.03  1.01 1.24  1.23 1.03 0.94 0.94 1.01 1.06  CALCIUM 8.6*  8.6* 8.4*  8.4* 8.2* 8.4* 8.2* 8.6* 8.8*  PHOS 3.7 3.3 2.5 2.9 3.3 3.3 3.5    CBC:  Recent Labs Lab 10/11/16 0236 10/12/16 0234 10/13/16 0514  WBC 6.4 6.0 5.6  NEUTROABS  --   --  3.4  HGB 9.9* 9.6* 10.0*  HCT 30.4* 28.9* 30.5*  MCV 92.4 92.3 92.4  PLT 120* 134* 157    CBG: No results for input(s): GLUCAP in the last 168 hours.  Brief HPI:   Patrick Good a 76 y.o.malewith history of HTN, prediabetes who sustained a fall due to episode of dizziness followed by brief syncopal episode while mowing his lawn.  He felt a snap of his ankle and sustained complex right tibial fracture with intraarticular extension. Hospitalist following for management of dehydration due to acute on chronic renal insufficiency and issues with hypotension. Syncope felt to be due to orthostasis. Dr. Marcelino Scot consulted and right knee effusion aspirated 6/6. He underwent IM nailing of right tibia and ORIF right ankle  on 6/8 by Dr. Marcelino Scot. Post op to be NWB RLE with ROM of knee as tolerated. He has had issue hypotension, ABLA as well as pain control. CIR recommended due to deficits in mobility as well as decreased ability to carry out ADL tasks   Hospital Course: Patrick Good was admitted to rehab 10/12/2016 for inpatient therapies to consist of PT and OT at least three hours five days a week. Past admission physiatrist, therapy team and rehab RN have worked together to provide customized collaborative inpatient rehab. Blood pressures have been monitored on bid basis and he was encouraged to push fluids due to evidence of dehydration and orthostatic changes. His renal status has improved and blood pressures have stabilized. ABLA has been monitored and H/H is improving. Dietitian was consulted to educate patient on CM diet/ carb counting. His mood has been stable and pain has been controlled with prn meds.  He has had improvement in activity tolerance and is modified independent at discharge. He will continue to receive follow up HHPT and [place] by Patrick Good after discharge.     Rehab course: During patient's stay team conference was held to monitor patient's progress, set goals and discuss barriers to discharge. At admission, patient required supervision with ADL tasks and min assist with  mobility.  He has had improvement in activity tolerance, balance, postural control, as well as ability to compensate for deficits.    He is modified independent for transfers and is able to ambulate 40- 80 feet with RW and supervision. He is able to complete ADL tasks with supervision. Family education was completed with wife     Disposition: Home  Diet: Carb Modified Medium  Special Instructions: 1. No weight on Right leg. Keep splint dry.  2. Drink plenty of fluids to keep hydrated. Take time with positional changes.Marland Kitchen    Discharge Instructions    Ambulatory referral to Physical Medicine Rehab    Complete by:  As  directed    1-2 weeks transitional care appt     Allergies as of 10/16/2016   No Active Allergies     Medication List    STOP taking these medications   lisinopril-hydrochlorothiazide 10-12.5 MG tablet Commonly known as:  PRINZIDE,ZESTORETIC     TAKE these medications   aspirin 81 MG tablet Take 1 tablet (81 mg total) by mouth daily. Start taking on:  11/08/2016 What changed:  These instructions start on 11/08/2016. If you are unsure what to do until then, ask your doctor or other care provider.   Cholecalciferol 2000 units Tabs Take 1 tablet (2,000 Units total) by mouth daily. Start taking on:  10/17/2016   cyclobenzaprine 5 MG tablet Commonly known as:  FLEXERIL Take 1 tablet (5 mg total) by mouth 2 (two) times daily as needed for muscle spasms.   enoxaparin 40 MG/0.4ML injection Commonly known as:  LOVENOX Inject 0.4 mLs (40 mg total) into the skin daily.   latanoprost 0.005 % ophthalmic solution Commonly known as:  XALATAN Place 1 drop into both eyes at bedtime.   MULTIVITAMIN ADULT PO Take 1 tablet by mouth daily. What changed:  Another medication with the same name was removed. Continue taking this medication, and follow the directions you see here.   nystatin cream Commonly known as:  MYCOSTATIN Apply 1 application topically 2 (two) times daily. What changed:  when to take this  reasons to take this   polyethylene glycol packet Commonly known as:  MIRALAX / GLYCOLAX Take 17 g by mouth 2 (two) times daily. Adjust as needed for constipation   traMADol 50 MG tablet--Rx # 30 pills  Commonly known as:  ULTRAM Take 1 tablet (50 mg total) by mouth every 6 (six) hours as needed for severe pain.   triamcinolone ointment 0.5 % Commonly known as:  KENALOG Apply 1 application topically 2 (two) times daily. To rash on forearm      Follow-up Information    [name], MD Follow up.   Specialty:  Physical Medicine and Rehabilitation Why:  [employer] will call  you with follow up appointment Contact information: [address] [place] [address] [phone]        Altamese Chickasaw, MD. Go on 10/28/2016.   Specialty:  Orthopedic Surgery Why:  be there at 10 am for follow up appointment.  Contact information: [place] [address] [phone]        [name], MD Follow up.   Specialty:  Family Medicine Why:  Need to follow up with Primary care of choice in 1-2 week for post hospital follow up.  Contact information: [address] [place] [address] [phone]           Signed: Bary Leriche 10/16/2016, 5:02  PM

## 2016-10-15 ENCOUNTER — Inpatient Hospital Stay (HOSPITAL_COMMUNITY): Payer: Medicare Other | Admitting: Physical Therapy

## 2016-10-15 ENCOUNTER — Inpatient Hospital Stay (HOSPITAL_COMMUNITY): Payer: Medicare Other | Admitting: Occupational Therapy

## 2016-10-15 DIAGNOSIS — R799 Abnormal finding of blood chemistry, unspecified: Secondary | ICD-10-CM

## 2016-10-15 LAB — RENAL FUNCTION PANEL
ALBUMIN: 3 g/dL — AB (ref 3.5–5.0)
Anion gap: 6 (ref 5–15)
BUN: 23 mg/dL — ABNORMAL HIGH (ref 6–20)
CALCIUM: 8.6 mg/dL — AB (ref 8.9–10.3)
CHLORIDE: 102 mmol/L (ref 101–111)
CO2: 31 mmol/L (ref 22–32)
CREATININE: 1.01 mg/dL (ref 0.61–1.24)
Glucose, Bld: 115 mg/dL — ABNORMAL HIGH (ref 65–99)
Phosphorus: 3.3 mg/dL (ref 2.5–4.6)
Potassium: 4.2 mmol/L (ref 3.5–5.1)
SODIUM: 139 mmol/L (ref 135–145)

## 2016-10-15 NOTE — Progress Notes (Signed)
Physical Therapy Session Note  Patient Details  Name: Patrick Good MRN: [id number] Date of Birth: [birth year]  Today's Date: 10/15/2016 PT Individual Time: 1330-1415 PT Individual Time Calculation (min): 45 min   Short Term Goals: Week 1:  PT Short Term Goal 1 (Week 1): STG=LTG due to ELOS  Skilled Therapeutic Interventions/Progress Updates:    Pt in bed upon arrival, agreeable to PT session. Bed mobility: mod I supine<>sitting from bed and mat table. Transfers: supervision with rw, cues to scoot forward and avoid pressing against sitting surface when standing. Ambulation: 90 ft X1, 50 ft X1, 12 ft X1 using rw and supervision, pt consistent with NWB status. Steps: up/down 1 (6inch) step X4 min assist to supervision. Exercise: supine: quad sets, heel slides (98 degrees), SAQ, SLR, hip abd/add (min resist), Lt LE single leg bridge 1X5. Exercises 1X10 unless otherwise specified. Pt propelling w/c to room following session. Pt sitting in w/c after session with all needs in reach.   Therapy Documentation Precautions:  Precautions Precautions: Fall Required Braces or Orthoses: Other Brace/Splint Other Brace/Splint: R ankle splint Restrictions Weight Bearing Restrictions: Yes RLE Weight Bearing: Non weight bearing  Pain: Reports mild pain at this time, recently medicated.   See Function Navigator for Current Functional Status.   Therapy/Group: Individual Therapy  Linard Millers, PT 10/15/2016, 3:48 PM

## 2016-10-15 NOTE — Progress Notes (Signed)
Physical Therapy Discharge Summary  Patient Details  Name: Patrick Good MRN: [id number] Date of Birth: [birth year]  Today's Date: 10/16/2016 PT Individual Time: 1191-4782 PT Individual Time Calculation (min): 30 min    Patient has met 5 of 6 long term goals due to improved activity tolerance, improved balance, increased strength, increased range of motion and improved coordination.  Patient to discharge at an ambulatory household distances and wheelchair for community distances at level [place].   Patient's care partner requires assistance to provide the necessary physical assistance at discharge. The pt will have his daughter available to assist with needed physical assistance for loading/unloading wheelchair if community access is needed. The patient's spouse is able to assist with walker as needed. Wheelchair management education was performed with the patient's spouse who denies any questions or concerns.   Reasons goals not met: Pt unable to ambulate 173f in controlled environment due to decreased endurance.   Recommendation:  Patient will benefit from ongoing skilled PT services in home health setting to continue to advance safe functional mobility, address ongoing impairments in strength, ROM, endurance, balance, functional mobility to minimize fall risk.  Equipment: wheelchair, rolling walker  Reasons for discharge: treatment goals met  Patient/family agrees with progress made and goals achieved: Yes  PT Discharge Precautions/Restrictions Precautions Precautions: Fall Other Brace/Splint: right ankle split cast Restrictions Weight Bearing Restrictions: Yes RLE Weight Bearing: Non weight bearing Vital Signs Therapy Vitals Temp: 98.4 F (36.9 C) Temp Source: Oral Pulse Rate: 79 Resp: 20 BP: (!) 105/53 Patient Position (if appropriate): Lying Oxygen Therapy SpO2: 100 % O2 Device: Not Delivered Pain Pain Assessment Pain Assessment: No/denies  pain Pain Score: 0-No pain Faces Pain Scale: No hurt Pain Type: Acute pain Pain Location: Head Pain Orientation: Anterior;Other (Comment) Pain Radiating Towards:  (frontal HA) Pain Descriptors / Indicators: Aching;Dull Pain Frequency: Rarely Pain Onset: Gradual Patients Stated Pain Goal: 0 Pain Intervention(s): Medication (See eMAR) Multiple Pain Sites: No Vision/Perception  Vision - Assessment Eye Alignment: Within Functional Limits Perception Perception: Within Functional Limits Praxis Praxis: Intact  Cognition Overall Cognitive Status: Within Functional Limits for tasks assessed Arousal/Alertness: Awake/alert Sensation Sensation Light Touch: Appears Intact Stereognosis: Appears Intact Hot/Cold: Appears Intact Proprioception: Appears Intact Coordination Gross Motor Movements are Fluid and Coordinated: Yes Fine Motor Movements are Fluid and Coordinated: Yes Motor  Motor Motor: Within Functional Limits  Mobility Transfers Sit to Stand: 6: Modified independent (Device/Increase time);With armrests;With upper extremity assist;From chair/3-in-1 Stand to Sit: 6: Modified independent (Device/Increase time);With armrests;With upper extremity assist;To chair/3-in-1 Locomotion  Ambulation Ambulation: Yes Ambulation/Gait Assistance: 6: Modified independent (Device/Increase time) Ambulation Distance (Feet): 80 Feet Assistive device: Rolling walker Gait Gait: Yes Gait Pattern: Impaired Gait Pattern:  (NMB LLE ) Gait velocity: decreased  Stairs / Additional Locomotion Stairs: Yes Stairs Assistance: 5: Supervision Stairs Assistance Details (indicate cue type and reason): supervision Stair Management Technique: With walker Number of Stairs: 1 Wheelchair Mobility Wheelchair Mobility: Yes Wheelchair Assistance: 6: Modified independent (Device/Increase time) WEnvironmental health practitioner Both upper extremities Wheelchair Parts Management: Independent Distance: 150  Trunk/Postural  Assessment  Cervical Assessment Cervical Assessment: Within Functional Limits Thoracic Assessment Thoracic Assessment: Within Functional Limits Lumbar Assessment Lumbar Assessment: Within Functional Limits Postural Control Postural Control: Within Functional Limits  Balance Balance Balance Assessed: Yes Static Sitting Balance Static Sitting - Balance Support: No upper extremity supported Static Sitting - Level of Assistance: 7: Independent Dynamic Sitting Balance Dynamic Sitting - Balance Support: No upper extremity supported Dynamic Sitting - Level of Assistance: 7:  Independent Static Standing Balance Static Standing - Balance Support: Bilateral upper extremity supported Static Standing - Level of Assistance: 6: Modified independent (Device/Increase time) Dynamic Standing Balance Dynamic Standing - Balance Support: During functional activity Dynamic Standing - Level of Assistance: 6: Modified independent (Device/Increase time) Extremity Assessment  RUE Assessment RUE Assessment: Within Functional Limits LUE Assessment LUE Assessment: Within Functional Limits RLE Assessment RLE Assessment: Exceptions to [facility] RLE Strength RLE Overall Strength Comments: SLR with ext lag, knee flexion 103 degrees LLE Assessment LLE Assessment: Within Functional Limits   See Function Navigator for Current Functional Status.  Patrick Good 10/16/2016, 4:28 PM   Patrick Good, PT

## 2016-10-15 NOTE — Progress Notes (Signed)
Occupational Therapy Session Note  Patient Details  Name: MAKO PELFREY MRN: [id number] Date of Birth: 01-22-1941  Today's Date: 10/15/2016 OT Individual Time: 1594-5859 OT Individual Time Calculation (min): 77 min    Short Term Goals: Week 1:  OT Short Term Goal 1 (Week 1): Short term goals equal to LTGs based on ELOS.  Skilled Therapeutic Interventions/Progress Updates:    Pt completed bathing and dressing during session.  Supervision for gathering items for dressing with the RW prior to starting session.  He was then able to ambulate to the shower in his room and transfer to the shower seat for bathing.  Close supervision for all aspects of bathing sit to stand.  He was able to transfer back out to the bed with supervision for dressing.  Had him use the RW for support to complete brushing teeth and combing hair.  Finished session with pt using the RW to clean up towels and place unused towels in the laundry bag.  Pt left in bed with call button and phone in reach.    Therapy Documentation Precautions:  Precautions Precautions: Fall Required Braces or Orthoses: Other Brace/Splint Other Brace/Splint: R ankle splint Restrictions Weight Bearing Restrictions: Yes RLE Weight Bearing: Non weight bearing  Pain: Pain Assessment Pain Assessment: Faces Faces Pain Scale: Hurts a little bit Pain Type: Surgical pain Pain Location: Foot Pain Orientation: Right Pain Descriptors / Indicators: Discomfort Pain Onset: With Activity Pain Intervention(s): Repositioned ADL: See Function Navigator for Current Functional Status.   Therapy/Group: Individual Therapy  Dawayne Ohair OTR/L 10/15/2016, 12:13 PM

## 2016-10-15 NOTE — Progress Notes (Signed)
Physical Therapy Session Note  Patient Details  Name: Patrick Good MRN: [id number] Date of Birth: 03/20/1941  Today's Date: 10/15/2016 PT Individual Time: 0930-1000 PT Individual Time Calculation (min): 30 min   Short Term Goals: Week 1:  PT Short Term Goal 1 (Week 1): STG=LTG due to ELOS  Skilled Therapeutic Interventions/Progress Updates: Pt presented sitting EOB agreeable to therapy. Pt performed ambulatory transfer to w/c with supervision and able to adjust leg rest mod I. Pt propelled to rehab gym with supervision requiring additional time for negotiation through narrower door. Transferred to mat in same manner as prior and performed supine therex with emphasis on R knee flexion. Pt instructed in AA heel slides with sheet with max range up to 103 degrees. Pt performed therex including SAQ, hip abd/add, heel slides, AA>ASLR all to fatigue. Pt returned to room and transferred back to bed in same manner as prior. Pt left sitting at EOB with all current needs met.      Therapy Documentation Precautions:  Precautions Precautions: Fall Required Braces or Orthoses: Other Brace/Splint Other Brace/Splint: R ankle splint Restrictions Weight Bearing Restrictions: Yes RLE Weight Bearing: Non weight bearing General:   Vital Signs: Therapy Vitals Temp: 98.9 F (37.2 C) Temp Source: Oral Pulse Rate: 80 Resp: 18 BP: 120/60 Patient Position (if appropriate): Lying Oxygen Therapy SpO2: 100 % O2 Device: Not Delivered Pain: Pain Assessment Pain Assessment: Faces Faces Pain Scale: Hurts a little bit Pain Type: Surgical pain Pain Location: Foot Pain Orientation: Right Pain Descriptors / Indicators: Discomfort Pain Onset: With Activity Pain Intervention(s): Repositioned    See Function Navigator for Current Functional Status.   Therapy/Group: Individual Therapy  Brion Sossamon  Darivs Lunden, PTA  10/15/2016, 3:39 PM

## 2016-10-15 NOTE — Progress Notes (Signed)
[place] PHYSICAL MEDICINE & REHABILITATION     PROGRESS NOTE  Subjective/Complaints:  Pt seen laying in bed this AM.  He slept well overnight and states he is progressing in therapies.   ROS: Denies CP, SOB, N/V/D.  Objective: Vital Signs: Blood pressure 113/66, pulse 71, temperature 99 F (37.2 C), temperature source Oral, resp. rate 18, height 6\' 3"  (1.905 m), weight 104.7 kg (230 lb 14.4 oz), SpO2 100 %. No results found.  Recent Labs  10/13/16 0514  WBC 5.6  HGB 10.0*  HCT 30.5*  PLT 157    Recent Labs  10/14/16 0538 10/15/16 0516  NA 139 139  K 3.9 4.2  CL 104 102  GLUCOSE 110* 115*  BUN 19 23*  CREATININE 0.94 1.01  CALCIUM 8.2* 8.6*   CBG (last 3)  No results for input(s): GLUCAP in the last 72 hours.  Wt Readings from Last 3 Encounters:  10/14/16 104.7 kg (230 lb 14.4 oz)  10/06/16 114.8 kg (253 lb)  10/02/16 114.8 kg (253 lb)    Physical Exam:  BP 113/66 (BP Location: Right Arm)   Pulse 71   Temp 99 F (37.2 C) (Oral)   Resp 18   Ht 6\' 3"  (1.905 m)   Wt 104.7 kg (230 lb 14.4 oz)   SpO2 100%   BMI 28.86 kg/m  Constitutional: He appears well-developed and well-nourished. NAD.  HENT: Normocephalic and atraumatic.  Eyes: EOMI. No discharge.  Cardiovascular: RRR.  No JVD. Respiratory: Effort normal and breath sounds normal.  GI: Soft. Bowel sounds are normal.  Musculoskeletal: He exhibits edema and tenderness.  Neurological: He is alert and oriented.  Sensation intact to light touch Motor: B/l UE, LLE 5/5 proximal to distal LLE: HF 4+/5, knee and ankle dressed. Wiggles toes (stable)  Skin: Skin is warm and dry. RLE dressing c/d/i  Assessment/Plan: 1. Functional deficits secondary to right tibial shaft fracture and knee effusion which require 3+ hours per day of interdisciplinary therapy in a comprehensive inpatient rehab setting. Physiatrist is providing close team supervision and 24 hour management of active medical problems listed  below. Physiatrist and rehab team continue to assess barriers to discharge/monitor patient progress toward functional and medical goals.  Function:  Bathing Bathing position   Position: Sitting EOB  Bathing parts Body parts bathed by patient: Right arm, Left arm, Chest, Abdomen, Front perineal area, Buttocks, Right upper leg, Left upper leg, Left lower leg, Back    Bathing assist        Upper Body Dressing/Undressing Upper body dressing   What is the patient wearing?: Pull over shirt/dress     Pull over shirt/dress - Perfomed by patient: Thread/unthread right sleeve, Thread/unthread left sleeve, Put head through opening, Pull shirt over trunk          Upper body assist Assist Level: Set up   Set up : To obtain clothing/put away  Lower Body Dressing/Undressing Lower body dressing   What is the patient wearing?: Non-skid slipper socks, Pants, Underwear Underwear - Performed by patient: Thread/unthread right underwear leg, Thread/unthread left underwear leg, Pull underwear up/down   Pants- Performed by patient: Thread/unthread left pants leg, Pull pants up/down   Non-skid slipper socks- Performed by patient: Don/doff left sock                    Lower body assist Assist for lower body dressing: Supervision or verbal cues      Toileting Toileting   Toileting steps completed by  patient: Adjust clothing prior to toileting, Performs perineal hygiene, Adjust clothing after toileting      Toileting assist Assist level: Supervision or verbal cues   Transfers Chair/bed transfer   Chair/bed transfer method: Ambulatory Chair/bed transfer assist level: Supervision or verbal cues Chair/bed transfer assistive device: Walker, Air cabin crew     Max distance: 75 ft Assist level: Supervision or verbal cues   Wheelchair   Type: Manual Max wheelchair distance: 110 ft Assist Level: Supervision or verbal cues  Cognition Comprehension Comprehension  assist level: Follows complex conversation/direction with no assist  Expression Expression assist level: Expresses complex ideas: With no assist  Social Interaction Social Interaction assist level: Interacts appropriately with others - No medications needed.  Problem Solving Problem solving assist level: Solves complex problems: Recognizes & self-corrects  Memory Memory assist level: Complete Independence: No helper    Medical Problem List and Plan: 1.  Deficits in mobility as well as decreased ability to carry out ADL tasks secondary to right tibial shaft fracture and knee effusion.  Cont CIR 2.  DVT Prophylaxis/Anticoagulation: Pharmaceutical: Lovenox 3. Pain Management: Continue oxycodone prn 4. Mood: LCSW to follow for evaluation and support.  5. Neuropsych: This patient is capable of making decisions on his own behalf. 6. Skin/Wound Care: Monitor wound for healing. Maintain adequate and hydration status. Added supplements to promote wound healing and as intake poor 7. Fluids/Electrolytes/Nutrition: Monitor I/Os  BMP within acceptable range on 6/13 8. Dizziness due to hypotension:   Orthostasis, but improving 6/14, asymptomatic   Encourage fluids, TEDs, abdominal binder as needed 9. Pre-diabetes: Hgb A1c- 5.7. Has lost 40 lbs in past few months. Will have dietitian educate on CM diet and moderation.   Overall controlled 6/14 10. AKI: Resolved  Cont to monitor  Encourage fluids, BUN trending up 6/14 11. ABLA: Added iron supplement.  Hb 10.0 on 6/12  Cont to monitor 12. Constipation: Continue miralax. Discontinued colace due to liquid stools.  13. Thrombocytopenia: Resolved  Monitor for signs of bleeding.   Plts 157 on 6/12 14. Protein calorie malnutrition: Supplements added.    LOS (Days) 3 A FACE TO FACE EVALUATION WAS PERFORMED  Ankit Lorie Phenix 10/15/2016 8:13 AM

## 2016-10-15 NOTE — Progress Notes (Signed)
Occupational Therapy Session Note  Patient Details  Name: Patrick Good MRN: [id number] Date of Birth: 09/15/1940  Today's Date: 10/15/2016 OT Individual Time: 1005-1102 OT Individual Time Calculation (min): 57 min    Short Term Goals: Week 1:  OT Short Term Goal 1 (Week 1): Short term goals equal to LTGs based on ELOS.  Skilled Therapeutic Interventions/Progress Updates:    Treatment session with focus on functional mobility and bathroom transfers.  Ambulated into ADL apt bathroom with RW with supervision.  Educated on tub/shower transfer with tub bench as alternative as pt reports they also have tub/shower.  Pt voicing concerns regarding access walk-in shower with spacing.  Completed tub/shower transfer with supervision with plan to further assess and discuss with wife and primary therapist.  Ambulated around therapy apt to navigate household obstacles with supervision.  TR present during session to evaluate and discuss leisure interests.  Ambulated approx 100 feet with RW with supervision prior to requesting to return to w/c and propelled back to room.    Therapy Documentation Precautions:  Precautions Precautions: Fall Required Braces or Orthoses: Other Brace/Splint Other Brace/Splint: R ankle splint Restrictions Weight Bearing Restrictions: Yes RLE Weight Bearing: Non weight bearing Pain: Pain Assessment Pain Assessment: Faces Faces Pain Scale: Hurts a little bit Pain Type: Surgical pain Pain Location: Foot Pain Orientation: Right Pain Descriptors / Indicators: Discomfort Pain Onset: With Activity Pain Intervention(s): Repositioned  See Function Navigator for Current Functional Status.   Therapy/Group: Individual Therapy  Simonne Come 10/15/2016, 12:20 PM

## 2016-10-15 NOTE — Evaluation (Signed)
Recreational Therapy Assessment and Plan  Patient Details  Name: Patrick Good MRN: [id number] Date of Birth: 08-13-1940 Today's Date: 10/15/2016  Rehab Potential: Good ELOS:     Assessment  Problem List:      Patient Active Problem List   Diagnosis Date Noted  . Vitamin D insufficiency 10/12/2016  . Fall 10/12/2016  . Fibula upper end fracture 10/12/2016  . Renal insufficiency   . Post-operative pain   . Constipation due to pain medication   . Orthostasis   . Arterial hypotension   . AKI (acute kidney injury) ([place])   . Syncope   . Acute blood loss anemia   . Prediabetes   . Fracture of tibial shaft, right, closed 10/08/2016  . Closed fracture of right tibial plafond with fibula involvement 10/06/2016  . History of colonic polyps 05/14/2015  . Cramps of lower extremity 05/14/2015  . Elevated IOP 05/14/2015  . Hemorrhoid 05/14/2015  . 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 Essential (primary) hypertension 09/13/2007    Past Medical History:      Past Medical History:  Diagnosis Date  . Closed fracture of right tibial plafond with fibula involvement 10/06/2016  . 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 Fracture of tibial shaft, right, closed 10/08/2016  . History of ankle fracture 1982   spiral break; left ankle  . History of chicken pox   . History of measles   . Hypertension   . Vitamin D insufficiency 10/12/2016   Past Surgical History:       Past Surgical History:  Procedure Laterality Date  . HERNIA REPAIR  1946 and 1952  . KNEE SURGERY  12/2011   Left meniscus by Dr. Wylene Simmer in [place]  . Myocardial Perfusion Scan  07/29/2011   Normal  . ORIF ANKLE FRACTURE Left 1982  . ORIF ANKLE FRACTURE Right 10/09/2016   Procedure: OPEN REDUCTION INTERNAL FIXATION (ORIF) ANKLE FRACTURE;  Surgeon: Altamese [place], MD;  Location: [facility];  Service: Orthopedics;  Laterality: Right;  . TIBIA IM NAIL INSERTION Right  10/09/2016   Procedure: INTRAMEDULLARY (IM) NAIL TIBIAL;  Surgeon: Altamese [place], MD;  Location: [facility];  Service: Orthopedics;  Laterality: Right;  Marland Kitchen VASECTOMY  1972    Assessment & Plan Clinical Impression: Patient is a 76 y.o. year old male with recent admission to the hospital on  Pt admitted 6/5 with sustained a fall due to episode of dizziness followed by brief syncopal episode while mowing his lawn. History taken from chart review, patient, and wife. 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 He felt a snap of his ankle and sustained complex right tibial fracture with intraarticular extension. Hospitalist following for management of dehydration due to acute on chronic renal insufficiency and issues with hypotension. Syncope felt to be due to orthostasis. Dr. Marcelino Scot consulted and right knee effusion aspirated 6/6 and patient underwent IM nailing of right tibia and ORIF right ankle on 6/8 by Dr. Marcelino Scot Patient transferred to [facility] on 10/12/2016.     Pt presents with decreased activity tolerance, decreased functional mobility, decreased balance, decreased leisure awareness Limiting pt's independence with leisure/community pursuits.  Met with pt today to discuss use of leisure time pre and post discharge, potential adaptations, and relaxation techniques.    Plan No further TR as pt is expected to discharge home in the next few days.  Education completed  Recommendations for other services: None  Discharge Criteria: Patient will be discharged from TR if patient refuses treatment 3 consecutive times without medical reason.  If treatment goals not met, if there is a change in medical status, if patient makes no progress towards goals or if patient is discharged from hospital.  The above assessment, treatment plan, treatment alternatives and goals were discussed and mutually agreed upon: by patient  [place] 10/15/2016, 11:18 AM

## 2016-10-15 NOTE — Plan of Care (Signed)
Problem: RH SKIN INTEGRITY Goal: RH STG MAINTAIN SKIN INTEGRITY WITH ASSISTANCE STG Maintain Skin Integrity With Assistance. Outcome: Progressing ACE wrap clean, dry and intact to right leg wound  Problem: RH SAFETY Goal: RH STG ADHERE TO SAFETY PRECAUTIONS W/ASSISTANCE/DEVICE STG Adhere to Safety Precautions With Assistance/Device. Outcome: Progressing No safety issues noted, safety precautions maintained  Problem: RH PAIN MANAGEMENT Goal: RH STG PAIN MANAGED AT OR BELOW PT'S PAIN GOAL Outcome: Progressing Medicated once for pain with full relief

## 2016-10-15 NOTE — Progress Notes (Signed)
Push fluids per Pam Love,PA. Pt instructed on adequate intake. Offered numerous fluids, water, fresh ice, coffee and juice.

## 2016-10-16 ENCOUNTER — Inpatient Hospital Stay (HOSPITAL_COMMUNITY): Payer: Medicare Other | Admitting: Occupational Therapy

## 2016-10-16 ENCOUNTER — Inpatient Hospital Stay (HOSPITAL_COMMUNITY): Payer: Medicare Other | Admitting: Physical Therapy

## 2016-10-16 ENCOUNTER — Inpatient Hospital Stay (HOSPITAL_COMMUNITY): Payer: Medicare Other

## 2016-10-16 DIAGNOSIS — S82101D Unspecified fracture of upper end of right tibia, subsequent encounter for closed fracture with routine healing: Secondary | ICD-10-CM

## 2016-10-16 LAB — RENAL FUNCTION PANEL
ANION GAP: 7 (ref 5–15)
Albumin: 3.1 g/dL — ABNORMAL LOW (ref 3.5–5.0)
BUN: 20 mg/dL (ref 6–20)
CALCIUM: 8.8 mg/dL — AB (ref 8.9–10.3)
CHLORIDE: 104 mmol/L (ref 101–111)
CO2: 28 mmol/L (ref 22–32)
Creatinine, Ser: 1.06 mg/dL (ref 0.61–1.24)
GFR calc non Af Amer: >60 mL/min (ref >60)
Glucose, Bld: 118 mg/dL — ABNORMAL HIGH (ref 65–99)
Phosphorus: 3.5 mg/dL (ref 2.5–4.6)
Potassium: 4.5 mmol/L (ref 3.5–5.1)
Sodium: 139 mmol/L (ref 135–145)

## 2016-10-16 MED ORDER — TRAMADOL HCL 50 MG PO TABS: 50.0000 mg | ORAL_TABLET | Freq: Four times a day (QID) | ORAL | 0 refills | Status: DC | PRN

## 2016-10-16 MED ORDER — CHOLECALCIFEROL 50 MCG (2000 UT) PO TABS: 2000.0000 [IU] | ORAL_TABLET | Freq: Every day | ORAL | 0 refills | Status: DC

## 2016-10-16 MED ORDER — ASPIRIN 81 MG PO TABS
81.0000 mg | ORAL_TABLET | Freq: Every day | ORAL | 0 refills | Status: DC
Start: 2016-11-08 — End: 2016-10-23

## 2016-10-16 MED ORDER — POLYETHYLENE GLYCOL 3350 17 G PO PACK: 17.0000 g | PACK | Freq: Two times a day (BID) | ORAL | 0 refills | Status: DC

## 2016-10-16 MED ORDER — CYCLOBENZAPRINE HCL 5 MG PO TABS: 5.0000 mg | ORAL_TABLET | Freq: Two times a day (BID) | ORAL | 0 refills | Status: DC | PRN

## 2016-10-16 MED ORDER — ENOXAPARIN SODIUM 40 MG/0.4ML ~~LOC~~ SOLN: 40.0000 mg | SUBCUTANEOUS | 0 refills | Status: DC

## 2016-10-16 NOTE — Patient Care Conference (Signed)
Inpatient RehabilitationTeam Conference and Plan of Care Update Date: 10/14/2016   Time: 10:10 AM    Patient Name: Patrick Good      Medical Record Number: [id number]  Date of Birth: Feb 05, 1941 Sex: Male         Room/Bed: 4M11C/4M11C-01 Payor Info: Payor: MEDICARE / Plan: MEDICARE PART A AND B / Product Type: *No Product type* /    Admitting Diagnosis: Rt Tibia Shaft Fx  Admit Date/Time:  10/12/2016  3:36 PM Admission Comments: No comment available   Primary Diagnosis:  <principal problem not specified> Principal Problem: <principal problem not specified>  Patient Active Problem List   Diagnosis Date Noted  . Elevated BUN   . Abnormality of gait   . Vitamin D insufficiency 10/12/2016  . Fall 10/12/2016  . Fibula upper end fracture 10/12/2016  . Renal insufficiency   . Post-operative pain   . Constipation due to pain medication   . Orthostasis   . Arterial hypotension   . AKI (acute kidney injury) ([place])   . Syncope   . Acute blood loss anemia   . Prediabetes   . Fracture of tibial shaft, right, closed 10/08/2016  . Closed fracture of right tibial plafond with fibula involvement 10/06/2016  . History of colonic polyps 05/14/2015  . Cramps of lower extremity 05/14/2015  . Elevated IOP 05/14/2015  . Hemorrhoid 05/14/2015  . 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 Essential (primary) hypertension 09/13/2007    Expected Discharge Date: Expected Discharge Date: 10/17/16  Team Members Present: Physician leading conference: Dr. Delice Lesch Social Worker Present: Alfonse Alpers, LCSW Nurse Present: Rayetta Humphrey, RN PT Present: Other (comment) Leavy Cella, PT) OT Present: Clyda Greener, OT PPS Coordinator present : Daiva Nakayama, RN, CRRN     Current Status/Progress Goal Weekly Team Focus  Medical   Deficits in mobility as well as decreased ability to carry out ADL tasks secondary to right tibial shaft fracture and knee effusion  Improve mobility, transfers, orthostasis,  prediabetes  See above   Bowel/Bladder   continent of bowel and bladder; LBM 06/09 per pt (very small loose BM on 06/11); received miralax X 2 on 06/12  BMs per pt's regular timing at home (every other day) with min assist  assess for changes in B/B q shift and prn   Swallow/Nutrition/ Hydration             ADL's   supervision for all UB and LB selfcare  modified independent level  selfcare retraining, balance retraining, transfer training, DME education, pt family education   Mobility   at min assist-supervision level with bed mob/ ambulation X 12 ft, consistent with NWB.   mod I with LRAD  bed mobility, transfers, DME, pt/family edu, HEP, gait, stairs.    Communication             Safety/Cognition/ Behavioral Observations            Pain   only c/o tingling to RLE without activity; with activity oxy 5-10mg  PRN; soreness to R shoulder post therapy on 5/12 (ibuprofen 650mg  PRN)  <3  assess for pain q shift and prn   Skin   ACE wrap and splint to RLE; less than 3 capillary refill  skin free from infection and breakdown  assess skin q shift and prn    Rehab Goals Patient on target to meet rehab goals: Yes Rehab Goals Revised: none *See Care Plan and progress notes for long and short-term goals.  Barriers to Discharge: Mobility,  transfers, pain, orthostasis, prediabetes    Possible Resolutions to Barriers:  Therapies, otpimize pain meds    Discharge Planning/Teaching Needs:  Pt plans to return to his home where his wife can provide supervision only.  Pt is completely independent to direct his care.   Team Discussion:  Pt doing well medically.  MD still monitoring labs and pt is pre-diabeteic.  MD told RN that heplock could be removed.  Pt is doing well with nursing care and dressing is dry/intact with stable vitals.  Pt was supervision on eval, but team plans to get him to mod I.  Pt has to follow NWB status and will need DME.  Wife can't help physically due to a bad knee and a  former knee replacement.  Dtr can assist some, but works fulltime.  Revisions to Treatment Plan:  none   Continued Need for Acute Rehabilitation Level of Care: The patient requires daily medical management by a physician with specialized training in physical medicine and rehabilitation for the following conditions: Daily direction of a multidisciplinary physical rehabilitation program to ensure safe treatment while eliciting the highest outcome that is of practical value to the patient.: Yes Daily medical management of patient stability for increased activity during participation in an intensive rehabilitation regime.: Yes Daily analysis of laboratory values and/or radiology reports with any subsequent need for medication adjustment of medical intervention for : Post surgical problems;Blood pressure problems;Other  Osmara Drummonds, Silvestre Mesi 10/16/2016, 9:10 AM

## 2016-10-16 NOTE — Discharge Instructions (Signed)
Inpatient Rehab Discharge Instructions  Patrick Good Discharge date and time:  10/17/16  Activities/Precautions/ Functional Status: Activity: no lifting, driving, or strenuous exercise till cleared by MD--No weight on right leg for 8 weeks total.  Diet: diabetic diet Wound Care: keep splint clean and dry   Functional status:  ___ No restrictions     ___ Walk up steps independently ___ 24/7 supervision/assistance   ___ Walk up steps with assistance _X__ Intermittent supervision/assistance  ___ Bathe/dress independently _X__ Walk with walker    ___ Bathe/dress with assistance ___ Walk Independently    ___ Shower independently ___ Walk with assistance    _X__ Shower with supervision.  _X__ No alcohol     ___ Return to work/school ________   COMMUNITY REFERRALS UPON DISCHARGE:   Home Health:   PT     RN     SNA    Agency:  [facility] Phone:  [phone] Medical Equipment/Items Ordered:  Tub transfer bench; lightweight wheelchair with elevating leg rests and cushion; rolling walker; wide drop arm commode  Agency/Supplier:  [facility]         Phone:  [phone]   Special Instructions: 1. Will need to continue lovenox for 4 weeks for DVT prophylaxis (end date 7/8)  2. Contact Dr. Marcelino Scot if you develop drainage, increase in swelling,  numbness/tingling of right leg or have problems with splint.   My questions have been answered and I understand these instructions. I will adhere to these goals and the provided educational materials after my discharge from the hospital.  Patient/Caregiver Signature _______________________________ Date __________  Clinician Signature _______________________________________ Date __________  Please bring this form and your medication list with you to all your follow-up doctor's appointments.

## 2016-10-16 NOTE — Progress Notes (Signed)
Social Work Assessment and Plan  Patient Details  Name: KYLOR VALVERDE MRN: [id number] Date of Birth: 02-04-1941  Today's Date: 10/15/2016  Problem List:  Patient Active Problem List   Diagnosis Date Noted  . Elevated BUN   . Abnormality of gait   . Vitamin D insufficiency 10/12/2016  . Fall 10/12/2016  . Fibula upper end fracture 10/12/2016  . Renal insufficiency   . Post-operative pain   . Constipation due to pain medication   . Orthostasis   . Arterial hypotension   . AKI (acute kidney injury) ([place])   . Syncope   . Acute blood loss anemia   . Prediabetes   . Fracture of tibial shaft, right, closed 10/08/2016  . Closed fracture of right tibial plafond with fibula involvement 10/06/2016  . History of colonic polyps 05/14/2015  . Cramps of lower extremity 05/14/2015  . Elevated IOP 05/14/2015  . Hemorrhoid 05/14/2015  . 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 Essential (primary) hypertension 09/13/2007   Past Medical History:  Past Medical History:  Diagnosis Date  . Closed fracture of right tibial plafond with fibula involvement 10/06/2016  . 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 Fracture of tibial shaft, right, closed 10/08/2016  . History of ankle fracture 1982   spiral break; left ankle  . History of chicken pox   . History of measles   . Hypertension   . Vitamin D insufficiency 10/12/2016   Past Surgical History:  Past Surgical History:  Procedure Laterality Date  . HERNIA REPAIR  1946 and 1952  . KNEE SURGERY  12/2011   Left meniscus by Dr. Wylene Simmer in [place]  . Myocardial Perfusion Scan  07/29/2011   Normal  . ORIF ANKLE FRACTURE Left 1982  . ORIF ANKLE FRACTURE Right 10/09/2016   Procedure: OPEN REDUCTION INTERNAL FIXATION (ORIF) ANKLE FRACTURE;  Surgeon: Altamese Crawfords[place], MD;  Location: [facility];  Service: Orthopedics;  Laterality: Right;  . TIBIA IM NAIL INSERTION Right 10/09/2016   Procedure: INTRAMEDULLARY (IM) NAIL TIBIAL;   Surgeon: Altamese Enon, MD;  Location: [facility];  Service: Orthopedics;  Laterality: Right;  Marland Kitchen VASECTOMY  1972   Social History:  reports that he quit smoking about 24 years ago. His smoking use included Cigarettes. He has a 12.50 pack-year smoking history. He has never used smokeless tobacco. He reports that he does not drink alcohol or use drugs.  Family / Support Systems Marital Status: Married How Long?: 20 years Patient Roles: Spouse, Parent, Other (Comment) (grandparent; neighbor) Spouse/Significant Other: Steadman Prosperi - wife - [phone] Children: Daanish Copes - dtr - [phone] and son in [place], [place] Anticipated Caregiver: wife Ability/Limitations of Caregiver: wife has bad knees, daughter works in [employer] Caregiver Availability: 24/7 Family Dynamics: supportive wife and dtr  Social History Preferred language: English Religion: None Read: Yes Write: Yes Employment Status: Retired (retired Agricultural consultant) Date 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   Abuse/Neglect Physical Abuse: Denies Verbal Abuse: Denies Sexual Abuse: Denies Exploitation of patient/patient's resources: Denies Self-Neglect: Denies  Emotional Status Pt's affect, behavior and adjustment status: Pt reports being in good spirits, but seems anxious about how he will go out in the community for doctors' appts and such, as wife cannot lift w/c in and out of the car.  CSW offered suggestions, most of which pt stated would not work.  Recent Psychosocial Issues: Pt was caring for his wife at home.  She has a bad knee and a knee replacement, but is otherwise in good health. Psychiatric History: none reported Substance Abuse History: none reported  Patient / Family Perceptions, Expectations & Goals Pt/Family understanding of illness &  functional limitations: Pt/wife expressed an understanding of pt's limitations and condition. Premorbid pt/family roles/activities: Pt was very active inside and outside of the home.  was driving and taking many of the neighbors trash cans out to the street for them.  Pt also helps with the caregiving of his 28 y/o granddtr. Anticipated changes in roles/activities/participation: Pt would like to resume his active, independent lifestyle once he is healed. Pt/family expectations/goals: Pt wants to get back to doing all of the actitivies he is used to doing.  Community Resources [employer]s: None Premorbid Home Care/DME Agencies: None Transportation available at discharge: wife  Discharge Planning Living Arrangements: Spouse/significant other Support Systems: Spouse/significant other, Children, Other relatives, Friends/neighbors Type of Residence: Private residence Insurance Resources: [employer], Multimedia programmer (specify) Sports administrator for [employer]) Museum/gallery curator Resources: [place] Referred: No Money Management: Patient Does the patient have any problems obtaining your medications?: No Home Management: Pt's wife will have to do this for a while. Patient/Family Preliminary Plans: Pt plans to return to his home with his wife and occasionally dtr to assist, but goals are mod I. Barriers to Discharge: Steps Social Work Anticipated Follow Up Needs: HH/OP Expected length of stay: 5 to 7 days  Clinical Impression CSW met with pt to introduce self and role of CSW, as well as to complete assessment.  Pt was very talkative and appeared slightly anxious about how he was going to do at home and who would help him get to appointments, as pt's wife could not lift w/c in and out of car due to bad knees.  CSW offered suggestions, but pt was unsure if any of those would work.  Pt's wife is measuring home for equipment.  Pt will need HH for PT, RN, and he wants a bath aide.  Pt's wife has a  handicap placard for the car.  Dtr will come to help on day of d/c.  Pt will need Lovenox and nursing to begin training.  CSW made sure that pt's pharmacy [facility] [place]) will order some to have in stock for pt.  Pt's to be d/c'd on 10-17-16.  CSW will arrange [facility] and order recommended DME.  CSW remains available to assist as needed.  Durand Wittmeyer, Silvestre Mesi 10/15/2016, 11:57 AM

## 2016-10-16 NOTE — Progress Notes (Signed)
[facility] PHYSICAL MEDICINE & REHABILITATION     PROGRESS NOTE  Subjective/Complaints:  No issues overnite, pt worried about bathroom accessiblity  ROS: Denies CP, SOB, N/V/D.  Objective: Vital Signs: Blood pressure 112/70, pulse 67, temperature 98.3 F (36.8 C), temperature source Oral, resp. rate 18, height 6\' 3"  (1.905 m), weight 104.7 kg (230 lb 14.4 oz), SpO2 100 %. No results found. No results for input(s): WBC, HGB, HCT, PLT in the last 72 hours.  Recent Labs  10/15/16 0516 10/16/16 0603  NA 139 139  K 4.2 4.5  CL 102 104  GLUCOSE 115* 118*  BUN 23* 20  CREATININE 1.01 1.06  CALCIUM 8.6* 8.8*   CBG (last 3)  No results for input(s): GLUCAP in the last 72 hours.  Wt Readings from Last 3 Encounters:  10/14/16 104.7 kg (230 lb 14.4 oz)  10/06/16 114.8 kg (253 lb)  10/02/16 114.8 kg (253 lb)    Physical Exam:  BP 112/70 (BP Location: Right Arm)   Pulse 67   Temp 98.3 F (36.8 C) (Oral)   Resp 18   Ht 6\' 3"  (1.905 m)   Wt 104.7 kg (230 lb 14.4 oz)   SpO2 100%   BMI 28.86 kg/m  Constitutional: He appears well-developed and well-nourished. NAD.  HENT: Normocephalic and atraumatic.  Eyes: EOMI. No discharge.  Cardiovascular: RRR.  No JVD. Respiratory: Effort normal and breath sounds normal.  GI: Soft. Bowel sounds are normal.  Musculoskeletal: He exhibits edema and tenderness.  Neurological: He is alert and oriented.  Sensation intact to light touch Motor: B/l UE, LLE 5/5 proximal to distal LLE: HF 4+/5, knee and ankle dressed. Wiggles toes (stable)  Skin: Skin is warm and dry. RLE dressing c/d/i  Assessment/Plan: 1. Functional deficits secondary to right tibial shaft fracture and knee effusion which require 3+ hours per day of interdisciplinary therapy in a comprehensive inpatient rehab setting. Physiatrist is providing close team supervision and 24 hour management of active medical problems listed below. Physiatrist and rehab team continue to assess  barriers to discharge/monitor patient progress toward functional and medical goals.  Function:  Bathing Bathing position   Position: Shower  Bathing parts Body parts bathed by patient: Right arm, Left arm, Chest, Abdomen, Front perineal area, Buttocks, Right upper leg, Left upper leg, Left lower leg, Back    Bathing assist Assist Level: Supervision or verbal cues      Upper Body Dressing/Undressing Upper body dressing   What is the patient wearing?: Pull over shirt/dress     Pull over shirt/dress - Perfomed by patient: Thread/unthread right sleeve, Thread/unthread left sleeve, Put head through opening, Pull shirt over trunk          Upper body assist Assist Level: Supervision or verbal cues   Set up : To obtain clothing/put away  Lower Body Dressing/Undressing Lower body dressing   What is the patient wearing?: Non-skid slipper socks, Pants, Underwear Underwear - Performed by patient: Thread/unthread right underwear leg, Thread/unthread left underwear leg, Pull underwear up/down   Pants- Performed by patient: Thread/unthread right pants leg, Thread/unthread left pants leg, Pull pants up/down   Non-skid slipper socks- Performed by patient: Don/doff left sock                    Lower body assist Assist for lower body dressing: Supervision or verbal cues      Toileting Toileting   Toileting steps completed by patient: Performs perineal hygiene, Adjust clothing prior to toileting, Adjust  clothing after toileting   Toileting Assistive Devices: Grab bar or rail  Toileting assist Assist level: More than reasonable time   Transfers Chair/bed transfer   Chair/bed transfer method: Ambulatory Chair/bed transfer assist level: Supervision or verbal cues Chair/bed transfer assistive device: Walker, Air cabin crew     Max distance: 90 ft Assist level: Supervision or verbal cues   Wheelchair   Type: Manual Max wheelchair distance: 150 ft Assist  Level: Supervision or verbal cues  Cognition Comprehension Comprehension assist level: Follows complex conversation/direction with no assist  Expression Expression assist level: Expresses complex ideas: With no assist  Social Interaction Social Interaction assist level: Interacts appropriately with others - No medications needed.  Problem Solving Problem solving assist level: Solves complex problems: Recognizes & self-corrects  Memory Memory assist level: Complete Independence: No helper    Medical Problem List and Plan: 1.  Deficits in mobility as well as decreased ability to carry out ADL tasks secondary to right tibial shaft fracture and knee effusion.  Cont CIR PT, OT- plan D/C in am   2.  DVT Prophylaxis/Anticoagulation: Pharmaceutical: Lovenox 3. Pain Management: Continue oxycodone prn 4. Mood: LCSW to follow for evaluation and support.  5. Neuropsych: This patient is capable of making decisions on his own behalf. 6. Skin/Wound Care: Monitor wound for healing. Maintain adequate and hydration status. Added supplements to promote wound healing and as intake poor 7. Fluids/Electrolytes/Nutrition: Monitor I/Os  BMP within acceptable range on 6/13 8. Dizziness due to hypotension:   Orthostasis, orhtostatic vitals this am showed no drop in BP from sit to stand  Encourage fluids, TEDs, abdominal binder as needed 9. Pre-diabetes: Hgb A1c- 5.7. Has lost 40 lbs in past few months. Will have dietitian educate on CM diet and moderation.   Overall controlled 6/14 10. AKI: Resolved  Cont to monitor  Encourage fluids, BUN trending up 6/14 11. ABLA: Added iron supplement.  Hb 10.0 on 6/12  Cont to monitor 12. Constipation: Continue miralax. Discontinued colace due to liquid stools.  13. Thrombocytopenia: Resolved  Monitor for signs of bleeding.   Plts 157 on 6/12 14. Protein calorie malnutrition: Supplements added.    LOS (Days) 4 A FACE TO FACE EVALUATION WAS PERFORMED  Patrick Good 10/16/2016 7:46 AM

## 2016-10-16 NOTE — Progress Notes (Signed)
Social Work Patient ID: Patrick Good, male   DOB: 1940/08/19, 76 y.o.   MRN: [id number]    Patrick Good, Patrick Legacy, LCSW Social Worker Signed   Patient Care Conference Date of Service: 10/16/2016 9:07 AM      [] Hide copied text [] Hover for attribution information Inpatient RehabilitationTeam Conference and Plan of Care Update Date: 10/14/2016   Time: 10:10 AM    Patient Name: Patrick Good      Medical Record Number: [id number]  Date of Birth: 04/06/1941 Sex: Male         Room/Bed: 4M11C/4M11C-01 Payor Info: Payor: MEDICARE / Plan: MEDICARE PART A AND B / Product Type: *No Product type* /    Admitting Diagnosis: Rt Tibia Shaft Fx  Admit Date/Time:  10/12/2016  3:36 PM Admission Comments: No comment available   Primary Diagnosis:  <principal problem not specified> Principal Problem: <principal problem not specified>      Patient Active Problem List   Diagnosis Date Noted  . Elevated BUN   . Abnormality of gait   . Vitamin D insufficiency 10/12/2016  . Fall 10/12/2016  . Fibula upper end fracture 10/12/2016  . Renal insufficiency   . Post-operative pain   . Constipation due to pain medication   . Orthostasis   . Arterial hypotension   . AKI (acute kidney injury) ([place])   . Syncope   . Acute blood loss anemia   . Prediabetes   . Fracture of tibial shaft, right, closed 10/08/2016  . Closed fracture of right tibial plafond with fibula involvement 10/06/2016  . History of colonic polyps 05/14/2015  . Cramps of lower extremity 05/14/2015  . Elevated IOP 05/14/2015  . Hemorrhoid 05/14/2015  . 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 Essential (primary) hypertension 09/13/2007    Expected Discharge Date: Expected Discharge Date: 10/17/16  Team Members Present: Physician leading conference: Dr. Delice Lesch Social Worker Present: Patrick Alpers, LCSW Nurse Present: Patrick Humphrey, RN PT Present: Other (comment) Patrick Good, PT) OT Present: Patrick Good,  OT PPS Coordinator present : Patrick Nakayama, RN, CRRN     Current Status/Progress Goal Weekly Team Focus  Medical   Deficits in mobility as well as decreased ability to carry out ADL tasks secondary to right tibial shaft fracture and knee effusion  Improve mobility, transfers, orthostasis, prediabetes  See above   Bowel/Bladder   continent of bowel and bladder; LBM 06/09 per pt (very small loose BM on 06/11); received miralax X 2 on 06/12  BMs per pt's regular timing at home (every other day) with min assist  assess for changes in B/B q shift and prn   Swallow/Nutrition/ Hydration             ADL's   supervision for all UB and LB selfcare  modified independent level  selfcare retraining, balance retraining, transfer training, DME education, pt family education   Mobility   at min assist-supervision level with bed mob/ ambulation X 12 ft, consistent with NWB.   mod I with LRAD  bed mobility, transfers, DME, pt/family edu, HEP, gait, stairs.    Communication             Safety/Cognition/ Behavioral Observations            Pain   only c/o tingling to RLE without activity; with activity oxy 5-10mg  PRN; soreness to R shoulder post therapy on 5/12 (ibuprofen 650mg  PRN)  <3  assess for pain q shift and prn   Skin   ACE wrap and  splint to RLE; less than 3 capillary refill  skin free from infection and breakdown  assess skin q shift and prn    Rehab Goals Patient on target to meet rehab goals: Yes Rehab Goals Revised: none *See Care Plan and progress notes for long and short-term goals.  Barriers to Discharge: Mobility, transfers, pain, orthostasis, prediabetes    Possible Resolutions to Barriers:  Therapies, otpimize pain meds    Discharge Planning/Teaching Needs:  Pt plans to return to his home where his wife can provide supervision only.  Pt is completely independent to direct his care.   Team Discussion:  Pt doing well medically.  MD still monitoring labs  and pt is pre-diabeteic.  MD told RN that heplock could be removed.  Pt is doing well with nursing care and dressing is dry/intact with stable vitals.  Pt was supervision on eval, but team plans to get him to mod I.  Pt has to follow NWB status and will need DME.  Wife can't help physically due to a bad knee and a former knee replacement.  Dtr can assist some, but works fulltime.  Revisions to Treatment Plan:  none   Continued Need for Acute Rehabilitation Level of Care: The patient requires daily medical management by a physician with specialized training in physical medicine and rehabilitation for the following conditions: Daily direction of a multidisciplinary physical rehabilitation program to ensure safe treatment while eliciting the highest outcome that is of practical value to the patient.: Yes Daily medical management of patient stability for increased activity during participation in an intensive rehabilitation regime.: Yes Daily analysis of laboratory values and/or radiology reports with any subsequent need for medication adjustment of medical intervention for : Post surgical problems;Blood pressure problems;Other  Patrick Good, Patrick Good 10/16/2016, 9:10 AM

## 2016-10-16 NOTE — Progress Notes (Signed)
Occupational Therapy Discharge Summary  Patient Details  Name: Patrick Good MRN: [id number] Date of Birth: 1940/11/09  Today's Date: 10/16/2016 OT Individual Time: 1001-1100 OT Individual Time Calculation (min): 45 min    Patient has met 10 of 10 long term goals due to improved balance and ability to compensate for deficits.  Patient to discharge at overall Modified Independent level.  Patient's care partner is independent to provide the necessary physical assistance at discharge.    Reasons goals not met: NA  Recommendation:  Patient does not need any further OT services at this time.    Equipment: Pt's spouse will get tub seat and bars to be placed on top of the elevated toilet  Reasons for discharge: treatment goals met and discharge from hospital  Patient/family agrees with progress made and goals achieved: Yes  OT Discharge Precautions/Restrictions  Precautions Precautions: Fall Other Brace/Splint: right ankle split cast Restrictions Weight Bearing Restrictions: Yes RLE Weight Bearing: Non weight bearing  Pain Pain Assessment Pain Assessment: No/denies pain Pain Score: 0-No pain Faces Pain Scale: No hurt Pain Type: Acute pain Pain Location: Head Pain Orientation: Anterior;Other (Comment) Pain Radiating Towards:  (frontal HA) Pain Descriptors / Indicators: Aching;Dull Pain Frequency: Rarely Pain Onset: Gradual Patients Stated Pain Goal: 0 Pain Intervention(s): Medication (See eMAR) Multiple Pain Sites: No ADL  See Function Section of chart for details  Vision Baseline Vision/History: Wears glasses Wears Glasses: Reading only Patient Visual Report: No change from baseline Vision Assessment?: No apparent visual deficits Eye Alignment: Within Functional Limits Perception  Perception: Within Functional Limits Praxis Praxis: Intact Cognition Overall Cognitive Status: Within Functional Limits for tasks assessed Arousal/Alertness:  Awake/alert Sensation Sensation Light Touch: Appears Intact Stereognosis: Appears Intact Hot/Cold: Appears Intact Proprioception: Appears Intact Coordination Gross Motor Movements are Fluid and Coordinated: Yes Fine Motor Movements are Fluid and Coordinated: Yes Motor  Motor Motor: Within Functional Limits Mobility  Transfers Transfers: Sit to Stand;Stand to Sit Sit to Stand: 6: Modified independent (Device/Increase time);With armrests;With upper extremity assist;From chair/3-in-1 Stand to Sit: 6: Modified independent (Device/Increase time);With armrests;With upper extremity assist;To chair/3-in-1  Trunk/Postural Assessment  Cervical Assessment Cervical Assessment: Within Functional Limits Thoracic Assessment Thoracic Assessment: Within Functional Limits Lumbar Assessment Lumbar Assessment: Within Functional Limits Postural Control Postural Control: Within Functional Limits  Balance Balance Balance Assessed: Yes Static Sitting Balance Static Sitting - Balance Support: No upper extremity supported Static Sitting - Level of Assistance: 7: Independent Dynamic Sitting Balance Dynamic Sitting - Balance Support: No upper extremity supported Dynamic Sitting - Level of Assistance: 7: Independent Static Standing Balance Static Standing - Balance Support: Bilateral upper extremity supported Static Standing - Level of Assistance: 6: Modified independent (Device/Increase time) Dynamic Standing Balance Dynamic Standing - Balance Support: During functional activity Dynamic Standing - Level of Assistance: 6: Modified independent (Device/Increase time) Extremity/Trunk Assessment RUE Assessment RUE Assessment: Within Functional Limits LUE Assessment LUE Assessment: Within Functional Limits   See Function Navigator for Current Functional Status.  Varonica Siharath OTR/L 10/16/2016, 4:08 PM

## 2016-10-16 NOTE — Progress Notes (Signed)
Physical Therapy Session Note  Patient Details  Name: Patrick Good MRN: [id number] Date of Birth: 1940-09-29  Today's Date: 10/16/2016 PT Individual Time: 0800-0900 PT Individual Time Calculation (min): 60 min   Short Term Goals: Week 1:  PT Short Term Goal 1 (Week 1): STG=LTG due to ELOS  Skilled Therapeutic Interventions/Progress Updates: Pt presented in bed agreeable to therapy. Pt indicated increased ms pain in neck and shoulders today. Donned TED hose supine in bed and performed supine to sit at EOB mod I. Performed ambulatory transfer to w/c and was mod I with chair management. Propelled to ortho gym and performed functional activities including car transfer, bed mobility, 1 step all mod I. Gait 20ft and 40 ft with supervision and RW. Reviewed supine therex including AASLR, heel slides, AA heel slides for knee ROM, hip abd/add, SAQ all to fatigue (approx 15-20). Pt returned to w/c and propelled back to room mod I. Remained in w/c at end of session to await next therapy.      Therapy Documentation Precautions:  Precautions Precautions: Fall Required Braces or Orthoses: Other Brace/Splint Other Brace/Splint: R ankle splint Restrictions Weight Bearing Restrictions: Yes RLE Weight Bearing: Non weight bearing      See Function Navigator for Current Functional Status.   Therapy/Group: Individual Therapy  Leonette Tischer  Jamiyla Ishee, PTA  10/16/2016, 12:11 PM

## 2016-10-16 NOTE — Progress Notes (Signed)
[facility] Individual Statement of Services  Patient Name:  Patrick Good  Date:  10/16/2016  Welcome to the [facility].  Our goal is to provide you with an individualized program based on your diagnosis and situation, designed to meet your specific needs.  With this comprehensive rehabilitation program, you will be expected to participate in at least 3 hours of rehabilitation therapies Monday-Friday, with modified therapy programming on the weekends.  Your rehabilitation program will include the following services:  Physical Therapy (PT), Occupational Therapy (OT), 24 hour per day rehabilitation nursing, Case Management (Social Worker), Rehabilitation Medicine, Nutrition Services and Pharmacy Services  Weekly team conferences will be held on Wednesdays to discuss your progress.  Your Social Worker will talk with you frequently to get your input and to update you on team discussions.  Team conferences with you and your family in attendance may also be held.  Expected length of stay:  5 to 7 days  Overall anticipated outcome:  Modified Independent  Depending on your progress and recovery, your program may change. Your Social Worker will coordinate services and will keep you informed of any changes. Your Social Worker's name and contact numbers are listed  below.  The following services may also be recommended but are not provided by the [facility] will be made to provide these services after discharge if needed.  Arrangements include referral to agencies that provide these services.  Your insurance has been verified to be:  Medicare and Tricare for Life Your primary doctor is:  Dr. Lelon Huh  Pertinent information will be shared with your doctor and your insurance company.  Social Worker:  Alfonse Alpers, LCSW   [phone] or [phone]  Information discussed with and copy given to patient by: Trey Sailors, 10/16/2016, 11:54 AM

## 2016-10-16 NOTE — Progress Notes (Signed)
Physical Therapy Session Note  Patient Details  Name: Patrick Good MRN: [id number] Date of Birth: [birth year]  Today's Date: 10/16/2016 PT Individual Time: 1400-1430 PT Individual Time Calculation (min): 30 min   Short Term Goals: Week 1:  PT Short Term Goal 1 (Week 1): STG=LTG due to ELOS  Skilled Therapeutic Interventions/Progress Updates: Pt received supine in bed, denies pain at rest and agreeable to treatment. Pt performed LE HEP with min cues for technique; 2x15 reps each exercise RLE only. Provided pt with handouts of LE HEP to continue performance at home. ModI supine>sit on EOB. Performed long arc quad 2x15 reps; declines performance of hip flexion marching d/t pain and fatigue. Handoff to OT at completion of session.      Therapy Documentation Precautions:  Precautions Precautions: Fall Required Braces or Orthoses: Other Brace/Splint Other Brace/Splint: R ankle splint Restrictions Weight Bearing Restrictions: Yes RLE Weight Bearing: Partial weight bearing   See Function Navigator for Current Functional Status.   Therapy/Group: Individual Therapy  Luberta Mutter 10/16/2016, 2:39 PM

## 2016-10-16 NOTE — Progress Notes (Signed)
Occupational Therapy Session Note  Patient Details  Name: Patrick Good MRN: [id number] Date of Birth: 1940-11-04  Today's Date: 10/16/2016 OT Individual Time: 1430-1515 OT Individual Time Calculation (min): 45 min    Short Term Goals: Week 1:  OT Short Term Goal 1 (Week 1): Short term goals equal to LTGs based on ELOS.  Skilled Therapeutic Interventions/Progress Updates:    1:1. Focus of session on functional transfers and UE exercise to improve strength and endurance required for ADLs. Pt completes stand step transfers with RW with MOD I EOB<>w/c<>EOM. Pt declines kitchen activity, IADL practice, or tub/shower transfers because "I have already done it." Pt agreeable to therapeutic exercise. Pt completes 1x15 of the following exercises with 5# dowel rod or 7# dumbell: Shoulder : int/ext rotation, ab/adduct, horizontal ab/adduc, PNF diagonals D1/2, shoulder press, row Elxbow: flex/ext, pro/supination  Pt completes 70 passes of basketball (overhead pass and chest pass with arms held in isometric hold at 90* flexion) with 2# wrist weights on to improve endurance required for BADLs  Therapy Documentation Precautions:  Precautions Precautions: Fall Required Braces or Orthoses: Other Brace/Splint Other Brace/Splint: R ankle splint Restrictions Weight Bearing Restrictions: Yes RLE Weight Bearing: Partial weight bearing  See Function Navigator for Current Functional Status.   Therapy/Group: Individual Therapy  Tonny Branch 10/16/2016, 3:31 PM

## 2016-10-16 NOTE — Progress Notes (Signed)
Social Work Patient ID: Patrick Good, male   DOB: 07/03/1940, 76 y.o.   MRN: [id number]   CSW met with pt 10-15-16 to update him on team conference discussion and targeted d/c date of 10-17-16.  Pt had heard of the date from therapists, but was waiting to hear it officially from me.  He wants to discuss his DME and [place] with his wife before CSW arranges anything, so CSW to f/u with pt today to have plans in place.  CSW remains available to assist as needed.

## 2016-10-17 DIAGNOSIS — S82871S Displaced pilon fracture of right tibia, sequela: Secondary | ICD-10-CM

## 2016-10-17 DIAGNOSIS — N179 Acute kidney failure, unspecified: Secondary | ICD-10-CM

## 2016-10-17 LAB — RENAL FUNCTION PANEL
ALBUMIN: 2.9 g/dL — AB (ref 3.5–5.0)
ANION GAP: 6 (ref 5–15)
BUN: 24 mg/dL — ABNORMAL HIGH (ref 6–20)
CO2: 30 mmol/L (ref 22–32)
Calcium: 8.5 mg/dL — ABNORMAL LOW (ref 8.9–10.3)
Chloride: 102 mmol/L (ref 101–111)
Creatinine, Ser: 1.12 mg/dL (ref 0.61–1.24)
GFR calc Af Amer: >60 mL/min (ref >60)
GFR calc non Af Amer: >60 mL/min (ref >60)
GLUCOSE: 111 mg/dL — AB (ref 65–99)
POTASSIUM: 4.5 mmol/L (ref 3.5–5.1)
Phosphorus: 3.5 mg/dL (ref 2.5–4.6)
Sodium: 138 mmol/L (ref 135–145)

## 2016-10-17 NOTE — Plan of Care (Signed)
Problem: RH BOWEL ELIMINATION Goal: RH STG MANAGE BOWEL WITH ASSISTANCE STG Manage Bowel with min Assistance by 10/26/16  Outcome: Progressing Patient had a BM, refused Miralax

## 2016-10-17 NOTE — Progress Notes (Signed)
Patrick Good is a 76 y.o. male [birth year] [id number]  Subjective: No new complaints. No new problems. Slept well. Feeling OK.  Objective: Vital signs in last 24 hours: Temp:  [98.2 F (36.8 C)-98.4 F (36.9 C)] 98.2 F (36.8 C) (06/16 0607) Pulse Rate:  [66-79] 66 (06/16 0607) Resp:  [16-20] 16 (06/16 0607) BP: (102-105)/(53-64) 102/64 (06/16 0607) SpO2:  [98 %-100 %] 98 % (06/16 0607) Weight change:  Last BM Date: 10/16/16  Intake/Output from previous day: 06/15 0701 - 06/16 0700 In: 600 [P.O.:600] Out: 650 [Urine:650] Last cbgs: CBG (last 3)  No results for input(s): GLUCAP in the last 72 hours.   Physical Exam General: No apparent distress   HEENT: not dry Lungs: Normal effort. Lungs clear to auscultation, no crackles or wheezes. Cardiovascular: Regular rate and rhythm, no edema Abdomen: S/NT/ND; BS(+) Musculoskeletal:  unchanged Neurological: No new neurological deficits Wounds: N/A    Skin: clear  Aging changes Mental state: Alert, oriented, cooperative    Lab Results: BMET    Component Value Date/Time   NA 138 10/17/2016 0333   NA 143 05/15/2015 1007   K 4.5 10/17/2016 0333   CL 102 10/17/2016 0333   CO2 30 10/17/2016 0333   GLUCOSE 111 (H) 10/17/2016 0333   BUN 24 (H) 10/17/2016 0333   BUN 25 05/15/2015 1007   CREATININE 1.12 10/17/2016 0333   CALCIUM 8.5 (L) 10/17/2016 0333   GFRNONAA >60 10/17/2016 0333   GFRAA >60 10/17/2016 0333   CBC    Component Value Date/Time   WBC 5.6 10/13/2016 0514   RBC 3.30 (L) 10/13/2016 0514   HGB 10.0 (L) 10/13/2016 0514   HCT 30.5 (L) 10/13/2016 0514   PLT 157 10/13/2016 0514   MCV 92.4 10/13/2016 0514   MCH 30.3 10/13/2016 0514   MCHC 32.8 10/13/2016 0514   RDW 13.5 10/13/2016 0514   LYMPHSABS 1.6 10/13/2016 0514   MONOABS 0.4 10/13/2016 0514   EOSABS 0.3 10/13/2016 0514   BASOSABS 0.0 10/13/2016 0514    Studies/Results: No results found.  Medications: I have reviewed the patient's current  medications.  Assessment/Plan:  1. R tibia fx - d/c home today. F/u w/Ortho 2. Pain in the R LE - oxycodone prn 3. Hypotension - BO is ok now 4. Constipation - Miralax prn 5. AKI - resolved 6. Thrombocytopenia - f/u w/PCP  D/c home today      Length of stay, days: Caroline , MD 10/17/2016, 8:23 AM

## 2016-10-17 NOTE — Progress Notes (Signed)
10/17/16 1055 nursing Patient discharged to home per wheelchair accompanied by NT and family. Per wife discharge instructions were discussed  Yesterday. [facility]. No further questions noted.

## 2016-10-19 NOTE — Progress Notes (Signed)
Social Work Discharge Note  The overall goal for the admission was met for:   Discharge location: Yes - home  Length of Stay: Yes - 5 days  Discharge activity level: Yes - modified independent  Home/community participation: Yes  Services provided included: MD, RD, PT, OT, RN, TR, Pharmacy and SW  Financial Services: Medicare and Other: Tricare for Life  Follow-up services arranged: Home Health: PT/RN/CNA, DME: 18"x18" lightweight w/c with basic cushion and ELRs; rolling walker; wide drop arm commode; tub transfer bench and Patient/Family request agency HH: [facility], DME: [facility]  Comments (or additional information): Pt to d/c to his home where he will be mod I.  Wife can provide supervision and coordination of care plan, but has bad knees and cannot assist with any physical care.  Patient/Family verbalized understanding of follow-up arrangements: Yes  Individual responsible for coordination of the follow-up plan: pt with assistance from wife  Confirmed correct DME delivered: Trey Sailors 10/19/2016    Ziv Welchel, Silvestre Mesi

## 2016-10-21 ENCOUNTER — Telehealth: Payer: Self-pay | Admitting: Family Medicine

## 2016-10-21 NOTE — Telephone Encounter (Signed)
Cam Hai with [facility] called for an FYI to let you know their PT will be going to Mr. [facility]ung house 10/22/16  to start home health services

## 2016-10-22 DIAGNOSIS — S82871D Displaced pilon fracture of right tibia, subsequent encounter for closed fracture with routine healing: Secondary | ICD-10-CM | POA: Diagnosis not present

## 2016-10-22 DIAGNOSIS — R7303 Prediabetes: Secondary | ICD-10-CM | POA: Diagnosis not present

## 2016-10-22 DIAGNOSIS — N189 Chronic kidney disease, unspecified: Secondary | ICD-10-CM | POA: Diagnosis not present

## 2016-10-22 DIAGNOSIS — I951 Orthostatic hypotension: Secondary | ICD-10-CM | POA: Diagnosis not present

## 2016-10-22 DIAGNOSIS — S82401D Unspecified fracture of shaft of right fibula, subsequent encounter for closed fracture with routine healing: Secondary | ICD-10-CM | POA: Diagnosis not present

## 2016-10-22 DIAGNOSIS — I129 Hypertensive chronic kidney disease with stage 1 through stage 4 chronic kidney disease, or unspecified chronic kidney disease: Secondary | ICD-10-CM | POA: Diagnosis not present

## 2016-10-23 ENCOUNTER — Ambulatory Visit (INDEPENDENT_AMBULATORY_CARE_PROVIDER_SITE_OTHER): Payer: Medicare Other | Admitting: Family Medicine

## 2016-10-23 ENCOUNTER — Encounter: Payer: Self-pay | Admitting: Family Medicine

## 2016-10-23 ENCOUNTER — Telehealth: Payer: Self-pay | Admitting: Family Medicine

## 2016-10-23 VITALS — BP 120/68 | HR 82 | Temp 98.2°F | Resp 16

## 2016-10-23 DIAGNOSIS — S82201D Unspecified fracture of shaft of right tibia, subsequent encounter for closed fracture with routine healing: Secondary | ICD-10-CM

## 2016-10-23 DIAGNOSIS — I10 Essential (primary) hypertension: Secondary | ICD-10-CM

## 2016-10-23 DIAGNOSIS — S82831S Other fracture of upper and lower end of right fibula, sequela: Secondary | ICD-10-CM

## 2016-10-23 DIAGNOSIS — S82871S Displaced pilon fracture of right tibia, sequela: Secondary | ICD-10-CM

## 2016-10-23 NOTE — Progress Notes (Signed)
Patient: Patrick Good Male    DOB: 11-14-1940   76 y.o.   MRN: [id number] Visit Date: 10/23/2016  Today's Provider: Lelon Huh, MD   Chief Complaint  Patient presents with  . Hospitalization Follow-up   Subjective:    HPI  Follow up Hospitalization  Patient was admitted to [facility] on 10/06/2016 He was treated for Closed fracture of right Tibial plafond with Fibula involvement. Patient was admitted to rehab on 10/12/2016 for inpatient therapies to consist of PT and OT 3 hours, 5 days a week.   He states that on day of injury he had been working outside in the heat and gotten himself dehydrated. He came in the house to get something to drink. He was then moving some items in near the bathroom when he bent over to pick up some items, when he stood back up he became light headed. He turned on his feet to try to catch himself when he fell to the ground resulting in the fracture of his leg. His blood pressure medication was stopped during the hospitalization, which he has not yet resumed. He was discharged to home from rehab on 6/16/018. He states his systolic blood pressures have run from 120s to 140s. Has had no dizziness but is non-weight bearing. His appetite is good. Has had no shortness of breath or chest pains. He would like to get home health aid since he cannot bear weight and having difficulty with ADLs. He has been in contact with [facility] who is supposed to contact our office for orders.  ------------------------------------------------------------------------------------       No Active Allergies   Current Outpatient Prescriptions:  .  [START ON 11/08/2016] aspirin 81 MG tablet, Take 1 tablet (81 mg total) by mouth daily., Disp: 30 tablet, Rfl: 0 .  cholecalciferol 2000 units TABS, Take 1 tablet (2,000 Units total) by mouth daily., Disp: 30 tablet, Rfl: 0 .  cyclobenzaprine (FLEXERIL) 5 MG tablet, Take 1 tablet (5 mg total) by mouth 2 (two) times daily  as needed for muscle spasms., Disp: 30 tablet, Rfl: 0 .  enoxaparin (LOVENOX) 40 MG/0.4ML injection, Inject 0.4 mLs (40 mg total) into the skin daily., Disp: 21 Syringe, Rfl: 0 .  latanoprost (XALATAN) 0.005 % ophthalmic solution, Place 1 drop into both eyes at bedtime. , Disp: , Rfl:  .  Multiple Vitamins-Minerals (MULTIVITAMIN ADULT PO), Take 1 tablet by mouth daily., Disp: , Rfl:  .  polyethylene glycol (MIRALAX / GLYCOLAX) packet, Take 17 g by mouth 2 (two) times daily. Adjust as needed for constipation, Disp: 60 each, Rfl: 0 .  traMADol (ULTRAM) 50 MG tablet, Take 1 tablet (50 mg total) by mouth every 6 (six) hours as needed for severe pain., Disp: 30 tablet, Rfl: 0  Review of Systems  Constitutional: Negative for appetite change, chills and fever.  Respiratory: Negative for chest tightness, shortness of breath and wheezing.   Cardiovascular: Positive for leg swelling. Negative for chest pain and palpitations.  Gastrointestinal: Negative for abdominal pain, nausea and vomiting.    Social History  Substance Use Topics  . Smoking status: Former Smoker    Packs/day: 0.50    Years: 25.00    Types: Cigarettes    Quit date: 05/04/1992  . Smokeless tobacco: Never Used  . Alcohol use No   Objective:   BP 120/68 (BP Location: Left Arm, Patient Position: Sitting, Cuff Size: Large)   Pulse 82   Temp 98.2 F (36.8  C) (Oral)   Resp 16   SpO2 97% Comment: room air There were no vitals filed for this visit.   Physical Exam   General Appearance:    Alert, cooperative, no distress  Eyes:    PERRL, conjunctiva/corneas clear, EOM's intact       Lungs:     Clear to auscultation bilaterally, respirations unlabored  Heart:    Regular rate and rhythm  Neurologic:   Awake, alert, oriented x 3. No apparent focal neurological           defect.   Ext:  right lower leg immobilized. Toes are seen which are normal in color and size.        Assessment & Plan:     .1. Essential (primary)  hypertension Currently off of lisinopril-hctz. Keep on hold for now Advised that mild elevations in BP would be acceptable in the short term, but he is to call if he consistently see SBP>150, or DBP>95.   Follow up in 6 weeks for hypertension.   2. Closed fracture of shaft of right tibia with routine healing, unspecified fracture morphology, subsequent encounter   3. Closed fracture of proximal end of right fibula, unspecified fracture morphology, sequela   4. Closed fracture of right tibial plafond with fibula involvement, sequela  Agree with need for home health aid to assist with ADLS.        Lelon Huh, MD  [facility]

## 2016-10-23 NOTE — Telephone Encounter (Signed)
Pam with [facility] called to advise they will be following pt for home care to educate pt for blood pressure and medication management.  CB#[phone]/MW

## 2016-10-23 NOTE — Telephone Encounter (Signed)
[id number] 

## 2016-10-23 NOTE — Patient Instructions (Signed)
   Hold blood pressure medications for now   Please call if your systolic blood pressure at rest is consistently above 150 or if your diastolic BP stays above 95.

## 2016-10-26 DIAGNOSIS — R7303 Prediabetes: Secondary | ICD-10-CM | POA: Diagnosis not present

## 2016-10-26 DIAGNOSIS — N189 Chronic kidney disease, unspecified: Secondary | ICD-10-CM | POA: Diagnosis not present

## 2016-10-26 DIAGNOSIS — S82871D Displaced pilon fracture of right tibia, subsequent encounter for closed fracture with routine healing: Secondary | ICD-10-CM | POA: Diagnosis not present

## 2016-10-26 DIAGNOSIS — I951 Orthostatic hypotension: Secondary | ICD-10-CM | POA: Diagnosis not present

## 2016-10-26 DIAGNOSIS — I129 Hypertensive chronic kidney disease with stage 1 through stage 4 chronic kidney disease, or unspecified chronic kidney disease: Secondary | ICD-10-CM | POA: Diagnosis not present

## 2016-10-26 DIAGNOSIS — S82401D Unspecified fracture of shaft of right fibula, subsequent encounter for closed fracture with routine healing: Secondary | ICD-10-CM | POA: Diagnosis not present

## 2016-10-27 DIAGNOSIS — S82401D Unspecified fracture of shaft of right fibula, subsequent encounter for closed fracture with routine healing: Secondary | ICD-10-CM | POA: Diagnosis not present

## 2016-10-27 DIAGNOSIS — I129 Hypertensive chronic kidney disease with stage 1 through stage 4 chronic kidney disease, or unspecified chronic kidney disease: Secondary | ICD-10-CM | POA: Diagnosis not present

## 2016-10-27 DIAGNOSIS — R7303 Prediabetes: Secondary | ICD-10-CM | POA: Diagnosis not present

## 2016-10-27 DIAGNOSIS — S82871D Displaced pilon fracture of right tibia, subsequent encounter for closed fracture with routine healing: Secondary | ICD-10-CM | POA: Diagnosis not present

## 2016-10-27 DIAGNOSIS — I951 Orthostatic hypotension: Secondary | ICD-10-CM | POA: Diagnosis not present

## 2016-10-27 DIAGNOSIS — N189 Chronic kidney disease, unspecified: Secondary | ICD-10-CM | POA: Diagnosis not present

## 2016-10-28 DIAGNOSIS — S82461D Displaced segmental fracture of shaft of right fibula, subsequent encounter for closed fracture with routine healing: Secondary | ICD-10-CM | POA: Diagnosis not present

## 2016-10-28 DIAGNOSIS — S82251D Displaced comminuted fracture of shaft of right tibia, subsequent encounter for closed fracture with routine healing: Secondary | ICD-10-CM | POA: Diagnosis not present

## 2016-10-28 DIAGNOSIS — S82871D Displaced pilon fracture of right tibia, subsequent encounter for closed fracture with routine healing: Secondary | ICD-10-CM | POA: Diagnosis not present

## 2016-10-29 DIAGNOSIS — R7303 Prediabetes: Secondary | ICD-10-CM | POA: Diagnosis not present

## 2016-10-29 DIAGNOSIS — S82871D Displaced pilon fracture of right tibia, subsequent encounter for closed fracture with routine healing: Secondary | ICD-10-CM | POA: Diagnosis not present

## 2016-10-29 DIAGNOSIS — I951 Orthostatic hypotension: Secondary | ICD-10-CM | POA: Diagnosis not present

## 2016-10-29 DIAGNOSIS — S82401D Unspecified fracture of shaft of right fibula, subsequent encounter for closed fracture with routine healing: Secondary | ICD-10-CM | POA: Diagnosis not present

## 2016-10-29 DIAGNOSIS — I129 Hypertensive chronic kidney disease with stage 1 through stage 4 chronic kidney disease, or unspecified chronic kidney disease: Secondary | ICD-10-CM | POA: Diagnosis not present

## 2016-10-29 DIAGNOSIS — N189 Chronic kidney disease, unspecified: Secondary | ICD-10-CM | POA: Diagnosis not present

## 2016-10-30 DIAGNOSIS — I129 Hypertensive chronic kidney disease with stage 1 through stage 4 chronic kidney disease, or unspecified chronic kidney disease: Secondary | ICD-10-CM | POA: Diagnosis not present

## 2016-10-30 DIAGNOSIS — R7303 Prediabetes: Secondary | ICD-10-CM | POA: Diagnosis not present

## 2016-10-30 DIAGNOSIS — S82871D Displaced pilon fracture of right tibia, subsequent encounter for closed fracture with routine healing: Secondary | ICD-10-CM | POA: Diagnosis not present

## 2016-10-30 DIAGNOSIS — I951 Orthostatic hypotension: Secondary | ICD-10-CM | POA: Diagnosis not present

## 2016-10-30 DIAGNOSIS — S82401D Unspecified fracture of shaft of right fibula, subsequent encounter for closed fracture with routine healing: Secondary | ICD-10-CM | POA: Diagnosis not present

## 2016-10-30 DIAGNOSIS — N189 Chronic kidney disease, unspecified: Secondary | ICD-10-CM | POA: Diagnosis not present

## 2016-11-01 DIAGNOSIS — S82401D Unspecified fracture of shaft of right fibula, subsequent encounter for closed fracture with routine healing: Secondary | ICD-10-CM | POA: Diagnosis not present

## 2016-11-01 DIAGNOSIS — R7303 Prediabetes: Secondary | ICD-10-CM | POA: Diagnosis not present

## 2016-11-01 DIAGNOSIS — S82871D Displaced pilon fracture of right tibia, subsequent encounter for closed fracture with routine healing: Secondary | ICD-10-CM | POA: Diagnosis not present

## 2016-11-01 DIAGNOSIS — N189 Chronic kidney disease, unspecified: Secondary | ICD-10-CM | POA: Diagnosis not present

## 2016-11-01 DIAGNOSIS — I951 Orthostatic hypotension: Secondary | ICD-10-CM | POA: Diagnosis not present

## 2016-11-01 DIAGNOSIS — I129 Hypertensive chronic kidney disease with stage 1 through stage 4 chronic kidney disease, or unspecified chronic kidney disease: Secondary | ICD-10-CM | POA: Diagnosis not present

## 2016-11-02 DIAGNOSIS — S82401D Unspecified fracture of shaft of right fibula, subsequent encounter for closed fracture with routine healing: Secondary | ICD-10-CM | POA: Diagnosis not present

## 2016-11-02 DIAGNOSIS — I129 Hypertensive chronic kidney disease with stage 1 through stage 4 chronic kidney disease, or unspecified chronic kidney disease: Secondary | ICD-10-CM | POA: Diagnosis not present

## 2016-11-02 DIAGNOSIS — S82871D Displaced pilon fracture of right tibia, subsequent encounter for closed fracture with routine healing: Secondary | ICD-10-CM | POA: Diagnosis not present

## 2016-11-02 DIAGNOSIS — N189 Chronic kidney disease, unspecified: Secondary | ICD-10-CM | POA: Diagnosis not present

## 2016-11-02 DIAGNOSIS — I951 Orthostatic hypotension: Secondary | ICD-10-CM | POA: Diagnosis not present

## 2016-11-02 DIAGNOSIS — R7303 Prediabetes: Secondary | ICD-10-CM | POA: Diagnosis not present

## 2016-11-04 DIAGNOSIS — N189 Chronic kidney disease, unspecified: Secondary | ICD-10-CM | POA: Diagnosis not present

## 2016-11-04 DIAGNOSIS — S82401D Unspecified fracture of shaft of right fibula, subsequent encounter for closed fracture with routine healing: Secondary | ICD-10-CM | POA: Diagnosis not present

## 2016-11-04 DIAGNOSIS — S82871D Displaced pilon fracture of right tibia, subsequent encounter for closed fracture with routine healing: Secondary | ICD-10-CM | POA: Diagnosis not present

## 2016-11-04 DIAGNOSIS — I951 Orthostatic hypotension: Secondary | ICD-10-CM | POA: Diagnosis not present

## 2016-11-04 DIAGNOSIS — R7303 Prediabetes: Secondary | ICD-10-CM | POA: Diagnosis not present

## 2016-11-04 DIAGNOSIS — I129 Hypertensive chronic kidney disease with stage 1 through stage 4 chronic kidney disease, or unspecified chronic kidney disease: Secondary | ICD-10-CM | POA: Diagnosis not present

## 2016-11-05 DIAGNOSIS — I951 Orthostatic hypotension: Secondary | ICD-10-CM | POA: Diagnosis not present

## 2016-11-05 DIAGNOSIS — S82871D Displaced pilon fracture of right tibia, subsequent encounter for closed fracture with routine healing: Secondary | ICD-10-CM | POA: Diagnosis not present

## 2016-11-05 DIAGNOSIS — S82401D Unspecified fracture of shaft of right fibula, subsequent encounter for closed fracture with routine healing: Secondary | ICD-10-CM | POA: Diagnosis not present

## 2016-11-05 DIAGNOSIS — I129 Hypertensive chronic kidney disease with stage 1 through stage 4 chronic kidney disease, or unspecified chronic kidney disease: Secondary | ICD-10-CM | POA: Diagnosis not present

## 2016-11-05 DIAGNOSIS — R7303 Prediabetes: Secondary | ICD-10-CM | POA: Diagnosis not present

## 2016-11-05 DIAGNOSIS — N189 Chronic kidney disease, unspecified: Secondary | ICD-10-CM | POA: Diagnosis not present

## 2016-11-06 ENCOUNTER — Encounter: Payer: Medicare Other | Attending: Physical Medicine & Rehabilitation | Admitting: Physical Medicine & Rehabilitation

## 2016-11-06 ENCOUNTER — Encounter: Payer: Self-pay | Admitting: Physical Medicine & Rehabilitation

## 2016-11-06 VITALS — BP 132/78 | HR 86 | Resp 14

## 2016-11-06 DIAGNOSIS — I959 Hypotension, unspecified: Secondary | ICD-10-CM | POA: Insufficient documentation

## 2016-11-06 DIAGNOSIS — K5901 Slow transit constipation: Secondary | ICD-10-CM | POA: Diagnosis not present

## 2016-11-06 DIAGNOSIS — R269 Unspecified abnormalities of gait and mobility: Secondary | ICD-10-CM | POA: Insufficient documentation

## 2016-11-06 DIAGNOSIS — I10 Essential (primary) hypertension: Secondary | ICD-10-CM | POA: Insufficient documentation

## 2016-11-06 DIAGNOSIS — S82201A Unspecified fracture of shaft of right tibia, initial encounter for closed fracture: Secondary | ICD-10-CM | POA: Insufficient documentation

## 2016-11-06 DIAGNOSIS — X58XXXA Exposure to other specified factors, initial encounter: Secondary | ICD-10-CM | POA: Diagnosis not present

## 2016-11-06 DIAGNOSIS — Z87891 Personal history of nicotine dependence: Secondary | ICD-10-CM | POA: Insufficient documentation

## 2016-11-06 DIAGNOSIS — G8918 Other acute postprocedural pain: Secondary | ICD-10-CM

## 2016-11-06 DIAGNOSIS — R7303 Prediabetes: Secondary | ICD-10-CM | POA: Insufficient documentation

## 2016-11-06 DIAGNOSIS — S82831S Other fracture of upper and lower end of right fibula, sequela: Secondary | ICD-10-CM

## 2016-11-06 DIAGNOSIS — K5900 Constipation, unspecified: Secondary | ICD-10-CM | POA: Diagnosis not present

## 2016-11-06 DIAGNOSIS — S82871S Displaced pilon fracture of right tibia, sequela: Secondary | ICD-10-CM | POA: Diagnosis not present

## 2016-11-06 DIAGNOSIS — E559 Vitamin D deficiency, unspecified: Secondary | ICD-10-CM | POA: Diagnosis not present

## 2016-11-06 NOTE — Progress Notes (Signed)
Subjective:    Patient ID: Patrick Good, male    DOB: 02-Aug-1940, 76 y.o.   MRN: [id number]  HPI 76 y.o. male with history of HTN, prediabetes with presents for who presents for hospital follow up for right tibial fracture.    Admit date: 10/12/2016 Discharge date: 10/17/2016  At discharge, he was instructed to be NWB RLE.  He saw Dr. Marcelino Scot who stated NWB for another 5-6 weeks.  He is not having issues with position changes.  Denies syncope.  He saw his PCP.  Pain is tolerable.  Overall, he states he is doing well.     Therapies: 2/week DME: Toilet seat Mobility: Wheelchair does not fit.  Rolling walker predominantly  Pain Inventory Average Pain 4 Pain Right Now 1 My pain is aching  In the last 24 hours, has pain interfered with the following? General activity 2 Relation with others 0 Enjoyment of life 2 What TIME of day is your pain at its worst? morning Sleep (in general) Fair  Pain is worse with: some activites Pain improves with: medication Relief from Meds: 10  Mobility walk with assistance use a walker ability to climb steps?  no do you drive?  no Do you have any goals in this area?  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 Heart Problems Sister   . Diabetes type II Sister   . Breast cancer Sister   . Multiple sclerosis Sister    Social History   Social History  . Marital status: Married    Spouse name: N/A  . Number of children: 2  . Years of education: N/A   Occupational History  . Retired     Former Doctor, hospital working part time in [employer]   Social History Main Topics  . Smoking status: Former Smoker    Packs/day: 0.50    Years:  25.00    Types: Cigarettes    Quit date: 05/04/1992  . Smokeless tobacco: Never Used  . Alcohol use No  . Drug use: No  . Sexual activity: Yes    Birth control/ protection: Surgical   Other Topics Concern  . None   Social History Narrative  . None   Past Surgical History:  Procedure Laterality Date  . HERNIA REPAIR  1946 and 1952  . KNEE SURGERY  12/2011   Left meniscus by Dr. Wylene Simmer in [place]  . Myocardial Perfusion Scan  07/29/2011   Normal  . ORIF ANKLE FRACTURE Left 1982  . ORIF ANKLE FRACTURE Right 10/09/2016   Procedure: OPEN REDUCTION INTERNAL FIXATION (ORIF) ANKLE FRACTURE;  Surgeon: Altamese Westby, MD;  Location: [facility];  Service: Orthopedics;  Laterality: Right;  . TIBIA IM NAIL INSERTION Right 10/09/2016   Procedure: INTRAMEDULLARY (IM) NAIL TIBIAL;  Surgeon: Altamese [place], MD;  Location: [facility];  Service: Orthopedics;  Laterality: Right;  Marland Kitchen VASECTOMY  1972   Past Medical History:  Diagnosis Date  . Closed fracture of right tibial plafond with fibula involvement 10/06/2016  . 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 Fracture of tibial shaft, right, closed 10/08/2016  . History of ankle fracture 1982   spiral break; left ankle  .  History of chicken pox   . History of measles   . Hypertension   . Vitamin D insufficiency 10/12/2016   BP 132/78 (BP Location: Right Arm, Patient Position: Sitting, Cuff Size: Normal)   Pulse 86   Resp 14   SpO2 95%   Opioid Risk Score:   Fall Risk Score:  `1  Depression screen PHQ 2/9  Depression screen [facility] 2/9 11/06/2016 05/19/2016 05/15/2015  Decreased Interest 0 0 0  Down, Depressed, Hopeless 0 0 0  PHQ - 2 Score 0 0 0  Altered sleeping 1 - -  Tired, decreased energy 0 - -  Change in appetite 0 - -  Feeling bad or failure about yourself  0 - -  Trouble concentrating 0 - -  Moving slowly or fidgety/restless 0 - -  Suicidal thoughts 0 - -  PHQ-9 Score 1 - -  Difficult doing work/chores Somewhat  difficult - -    Review of Systems  Constitutional: Negative.   HENT: Negative.   Eyes: Negative.   Respiratory: Negative.   Cardiovascular: Negative.   Gastrointestinal: Negative.   Endocrine: Negative.   Genitourinary: Negative.   Musculoskeletal: Positive for gait problem.       Spasms   Skin: Negative.   Allergic/Immunologic: Negative.   Hematological: Negative.   Psychiatric/Behavioral: Negative.   All other systems reviewed and are negative.      Objective:   Physical Exam Constitutional: He appears well-developed and well-nourished. NAD.  HENT: Normocephalic and atraumatic.  Eyes: EOMI. No discharge.  Cardiovascular: RRR.  No JVD. Respiratory: Effort normal and breath sounds normal.  GI: Soft. Bowel sounds are normal.  Musculoskeletal: He exhibits edema and tenderness.  Neurological: He is alert and oriented.  Sensation intact to light touch Motor: B/l UE, LLE 5/5 proximal to distal LLE: HF 4+/5, knee and ankle dressed. Wiggles toes (stable)  Skin: Skin is warm and dry. RLE dressing c/d/i     Assessment & Plan:  76 y.o. male with history of HTN, prediabetes with presents for who presents for hospital follow up for right tibial fracture.    1. Deficits in mobility as well as decreased ability to carry out ADL tasks secondary to right tibial shaft fracture and knee effusion.  Cont therapies  Cont Ortho follow up  2. Pain Management:   Relatively controlled at present  Cont meds  3. Dizziness due to hypotension:   BP controlled  No reported symptoms at present  4. Abnormality of gait  Cont walker for safety, wheelchair not available in size  5. Constipation  Miralax

## 2016-11-10 DIAGNOSIS — S82401D Unspecified fracture of shaft of right fibula, subsequent encounter for closed fracture with routine healing: Secondary | ICD-10-CM | POA: Diagnosis not present

## 2016-11-10 DIAGNOSIS — R7303 Prediabetes: Secondary | ICD-10-CM | POA: Diagnosis not present

## 2016-11-10 DIAGNOSIS — S82871D Displaced pilon fracture of right tibia, subsequent encounter for closed fracture with routine healing: Secondary | ICD-10-CM | POA: Diagnosis not present

## 2016-11-10 DIAGNOSIS — I951 Orthostatic hypotension: Secondary | ICD-10-CM | POA: Diagnosis not present

## 2016-11-10 DIAGNOSIS — N189 Chronic kidney disease, unspecified: Secondary | ICD-10-CM | POA: Diagnosis not present

## 2016-11-10 DIAGNOSIS — I129 Hypertensive chronic kidney disease with stage 1 through stage 4 chronic kidney disease, or unspecified chronic kidney disease: Secondary | ICD-10-CM | POA: Diagnosis not present

## 2016-11-12 DIAGNOSIS — N189 Chronic kidney disease, unspecified: Secondary | ICD-10-CM | POA: Diagnosis not present

## 2016-11-12 DIAGNOSIS — S82401D Unspecified fracture of shaft of right fibula, subsequent encounter for closed fracture with routine healing: Secondary | ICD-10-CM | POA: Diagnosis not present

## 2016-11-12 DIAGNOSIS — S82871D Displaced pilon fracture of right tibia, subsequent encounter for closed fracture with routine healing: Secondary | ICD-10-CM | POA: Diagnosis not present

## 2016-11-12 DIAGNOSIS — R7303 Prediabetes: Secondary | ICD-10-CM | POA: Diagnosis not present

## 2016-11-12 DIAGNOSIS — I129 Hypertensive chronic kidney disease with stage 1 through stage 4 chronic kidney disease, or unspecified chronic kidney disease: Secondary | ICD-10-CM | POA: Diagnosis not present

## 2016-11-12 DIAGNOSIS — I951 Orthostatic hypotension: Secondary | ICD-10-CM | POA: Diagnosis not present

## 2016-11-16 ENCOUNTER — Other Ambulatory Visit: Payer: Self-pay

## 2016-11-16 ENCOUNTER — Telehealth: Payer: Self-pay

## 2016-11-16 MED ORDER — CYCLOBENZAPRINE HCL 5 MG PO TABS: 5.0000 mg | ORAL_TABLET | Freq: Two times a day (BID) | ORAL | 2 refills | Status: DC | PRN

## 2016-11-16 NOTE — Telephone Encounter (Signed)
Mrs. Poitras has came to the window requesting refill on medication Flexeril. Patient preferred  pharmacy is [facility].

## 2016-11-17 DIAGNOSIS — R7303 Prediabetes: Secondary | ICD-10-CM | POA: Diagnosis not present

## 2016-11-17 DIAGNOSIS — S82871D Displaced pilon fracture of right tibia, subsequent encounter for closed fracture with routine healing: Secondary | ICD-10-CM | POA: Diagnosis not present

## 2016-11-17 DIAGNOSIS — N189 Chronic kidney disease, unspecified: Secondary | ICD-10-CM | POA: Diagnosis not present

## 2016-11-17 DIAGNOSIS — S82401D Unspecified fracture of shaft of right fibula, subsequent encounter for closed fracture with routine healing: Secondary | ICD-10-CM | POA: Diagnosis not present

## 2016-11-17 DIAGNOSIS — I129 Hypertensive chronic kidney disease with stage 1 through stage 4 chronic kidney disease, or unspecified chronic kidney disease: Secondary | ICD-10-CM | POA: Diagnosis not present

## 2016-11-17 DIAGNOSIS — I951 Orthostatic hypotension: Secondary | ICD-10-CM | POA: Diagnosis not present

## 2016-11-19 DIAGNOSIS — I129 Hypertensive chronic kidney disease with stage 1 through stage 4 chronic kidney disease, or unspecified chronic kidney disease: Secondary | ICD-10-CM | POA: Diagnosis not present

## 2016-11-19 DIAGNOSIS — N189 Chronic kidney disease, unspecified: Secondary | ICD-10-CM | POA: Diagnosis not present

## 2016-11-19 DIAGNOSIS — S82401D Unspecified fracture of shaft of right fibula, subsequent encounter for closed fracture with routine healing: Secondary | ICD-10-CM | POA: Diagnosis not present

## 2016-11-19 DIAGNOSIS — R7303 Prediabetes: Secondary | ICD-10-CM | POA: Diagnosis not present

## 2016-11-19 DIAGNOSIS — I951 Orthostatic hypotension: Secondary | ICD-10-CM | POA: Diagnosis not present

## 2016-11-19 DIAGNOSIS — S82871D Displaced pilon fracture of right tibia, subsequent encounter for closed fracture with routine healing: Secondary | ICD-10-CM | POA: Diagnosis not present

## 2016-11-25 DIAGNOSIS — S82461D Displaced segmental fracture of shaft of right fibula, subsequent encounter for closed fracture with routine healing: Secondary | ICD-10-CM | POA: Diagnosis not present

## 2016-11-25 DIAGNOSIS — S82871D Displaced pilon fracture of right tibia, subsequent encounter for closed fracture with routine healing: Secondary | ICD-10-CM | POA: Diagnosis not present

## 2016-11-25 DIAGNOSIS — S82251D Displaced comminuted fracture of shaft of right tibia, subsequent encounter for closed fracture with routine healing: Secondary | ICD-10-CM | POA: Diagnosis not present

## 2016-11-26 DIAGNOSIS — I129 Hypertensive chronic kidney disease with stage 1 through stage 4 chronic kidney disease, or unspecified chronic kidney disease: Secondary | ICD-10-CM | POA: Diagnosis not present

## 2016-11-26 DIAGNOSIS — I951 Orthostatic hypotension: Secondary | ICD-10-CM | POA: Diagnosis not present

## 2016-11-26 DIAGNOSIS — N189 Chronic kidney disease, unspecified: Secondary | ICD-10-CM | POA: Diagnosis not present

## 2016-11-26 DIAGNOSIS — S82871D Displaced pilon fracture of right tibia, subsequent encounter for closed fracture with routine healing: Secondary | ICD-10-CM | POA: Diagnosis not present

## 2016-11-26 DIAGNOSIS — S82401D Unspecified fracture of shaft of right fibula, subsequent encounter for closed fracture with routine healing: Secondary | ICD-10-CM | POA: Diagnosis not present

## 2016-11-26 DIAGNOSIS — R7303 Prediabetes: Secondary | ICD-10-CM | POA: Diagnosis not present

## 2016-12-03 DIAGNOSIS — N189 Chronic kidney disease, unspecified: Secondary | ICD-10-CM | POA: Diagnosis not present

## 2016-12-03 DIAGNOSIS — S82401D Unspecified fracture of shaft of right fibula, subsequent encounter for closed fracture with routine healing: Secondary | ICD-10-CM | POA: Diagnosis not present

## 2016-12-03 DIAGNOSIS — I951 Orthostatic hypotension: Secondary | ICD-10-CM | POA: Diagnosis not present

## 2016-12-03 DIAGNOSIS — S82871D Displaced pilon fracture of right tibia, subsequent encounter for closed fracture with routine healing: Secondary | ICD-10-CM | POA: Diagnosis not present

## 2016-12-03 DIAGNOSIS — I129 Hypertensive chronic kidney disease with stage 1 through stage 4 chronic kidney disease, or unspecified chronic kidney disease: Secondary | ICD-10-CM | POA: Diagnosis not present

## 2016-12-03 DIAGNOSIS — R7303 Prediabetes: Secondary | ICD-10-CM | POA: Diagnosis not present

## 2016-12-09 DIAGNOSIS — S82461D Displaced segmental fracture of shaft of right fibula, subsequent encounter for closed fracture with routine healing: Secondary | ICD-10-CM | POA: Diagnosis not present

## 2016-12-09 DIAGNOSIS — M25561 Pain in right knee: Secondary | ICD-10-CM | POA: Diagnosis not present

## 2016-12-10 DIAGNOSIS — R7303 Prediabetes: Secondary | ICD-10-CM | POA: Diagnosis not present

## 2016-12-10 DIAGNOSIS — N189 Chronic kidney disease, unspecified: Secondary | ICD-10-CM | POA: Diagnosis not present

## 2016-12-10 DIAGNOSIS — I129 Hypertensive chronic kidney disease with stage 1 through stage 4 chronic kidney disease, or unspecified chronic kidney disease: Secondary | ICD-10-CM | POA: Diagnosis not present

## 2016-12-10 DIAGNOSIS — I951 Orthostatic hypotension: Secondary | ICD-10-CM | POA: Diagnosis not present

## 2016-12-10 DIAGNOSIS — S82871D Displaced pilon fracture of right tibia, subsequent encounter for closed fracture with routine healing: Secondary | ICD-10-CM | POA: Diagnosis not present

## 2016-12-10 DIAGNOSIS — S82401D Unspecified fracture of shaft of right fibula, subsequent encounter for closed fracture with routine healing: Secondary | ICD-10-CM | POA: Diagnosis not present

## 2016-12-17 DIAGNOSIS — N189 Chronic kidney disease, unspecified: Secondary | ICD-10-CM | POA: Diagnosis not present

## 2016-12-17 DIAGNOSIS — I951 Orthostatic hypotension: Secondary | ICD-10-CM | POA: Diagnosis not present

## 2016-12-17 DIAGNOSIS — I129 Hypertensive chronic kidney disease with stage 1 through stage 4 chronic kidney disease, or unspecified chronic kidney disease: Secondary | ICD-10-CM | POA: Diagnosis not present

## 2016-12-17 DIAGNOSIS — R7303 Prediabetes: Secondary | ICD-10-CM | POA: Diagnosis not present

## 2016-12-17 DIAGNOSIS — S82871D Displaced pilon fracture of right tibia, subsequent encounter for closed fracture with routine healing: Secondary | ICD-10-CM | POA: Diagnosis not present

## 2016-12-17 DIAGNOSIS — S82401D Unspecified fracture of shaft of right fibula, subsequent encounter for closed fracture with routine healing: Secondary | ICD-10-CM | POA: Diagnosis not present

## 2016-12-30 DIAGNOSIS — S82871D Displaced pilon fracture of right tibia, subsequent encounter for closed fracture with routine healing: Secondary | ICD-10-CM | POA: Diagnosis not present

## 2016-12-30 DIAGNOSIS — S82251D Displaced comminuted fracture of shaft of right tibia, subsequent encounter for closed fracture with routine healing: Secondary | ICD-10-CM | POA: Diagnosis not present

## 2016-12-31 ENCOUNTER — Telehealth: Payer: Self-pay | Admitting: Physical Medicine & Rehabilitation

## 2016-12-31 NOTE — Telephone Encounter (Signed)
Pt's wife presented to state that the pt's surgeon Dr. Marcelino Scot wants the patient to start physical therapy. Pt's wife phoned PT, who stated that until Dr. Posey Pronto releases the patient Medicare will not pay. Please advise on a course of action.

## 2016-12-31 NOTE — Telephone Encounter (Signed)
He is released for physical therapy from my perspective.

## 2016-12-31 NOTE — Telephone Encounter (Signed)
Notified Mrs Chiara.

## 2017-01-06 DIAGNOSIS — M79604 Pain in right leg: Secondary | ICD-10-CM | POA: Diagnosis not present

## 2017-01-07 DIAGNOSIS — H401131 Primary open-angle glaucoma, bilateral, mild stage: Secondary | ICD-10-CM | POA: Diagnosis not present

## 2017-01-08 ENCOUNTER — Encounter: Payer: Medicare Other | Attending: Physical Medicine & Rehabilitation | Admitting: Physical Medicine & Rehabilitation

## 2017-01-08 ENCOUNTER — Encounter: Payer: Self-pay | Admitting: Physical Medicine & Rehabilitation

## 2017-01-08 VITALS — BP 116/74 | HR 80 | Resp 14

## 2017-01-08 DIAGNOSIS — S82871S Displaced pilon fracture of right tibia, sequela: Secondary | ICD-10-CM | POA: Diagnosis not present

## 2017-01-08 DIAGNOSIS — S82201A Unspecified fracture of shaft of right tibia, initial encounter for closed fracture: Secondary | ICD-10-CM | POA: Diagnosis not present

## 2017-01-08 DIAGNOSIS — K5901 Slow transit constipation: Secondary | ICD-10-CM | POA: Diagnosis not present

## 2017-01-08 DIAGNOSIS — E559 Vitamin D deficiency, unspecified: Secondary | ICD-10-CM | POA: Diagnosis not present

## 2017-01-08 DIAGNOSIS — I10 Essential (primary) hypertension: Secondary | ICD-10-CM | POA: Diagnosis not present

## 2017-01-08 DIAGNOSIS — Z87891 Personal history of nicotine dependence: Secondary | ICD-10-CM | POA: Diagnosis not present

## 2017-01-08 DIAGNOSIS — K5900 Constipation, unspecified: Secondary | ICD-10-CM | POA: Insufficient documentation

## 2017-01-08 DIAGNOSIS — S82831S Other fracture of upper and lower end of right fibula, sequela: Secondary | ICD-10-CM | POA: Diagnosis not present

## 2017-01-08 DIAGNOSIS — I959 Hypotension, unspecified: Secondary | ICD-10-CM | POA: Insufficient documentation

## 2017-01-08 DIAGNOSIS — R7303 Prediabetes: Secondary | ICD-10-CM | POA: Insufficient documentation

## 2017-01-08 DIAGNOSIS — R269 Unspecified abnormalities of gait and mobility: Secondary | ICD-10-CM | POA: Diagnosis not present

## 2017-01-08 NOTE — Progress Notes (Signed)
Subjective:    Patient ID: Patrick Good, male    DOB: 1940/07/14, 76 y.o.   MRN: [id number]  HPI 76 y.o. male with history of HTN, prediabetes with presents for who presents for follow up for right tibial fracture.    Last clinic visit, 11/06/16. Since that time, he has completed HH therapies and is now referred to outpatient therapies.  He continues to follow up with Ortho.  His dizziness has not returned.  He is ambulating with cane at present.  He continues to be constipated.  Denies falls.   Pain Inventory Average Pain 2 Pain Right Now 2 My pain is constant and dull  In the last 24 hours, has pain interfered with the following? General activity 1 Relation with others 2 Enjoyment of life 2 What TIME of day is your pain at its worst? daytime Sleep (in general) Fair  Pain is worse with: some activites Pain improves with: rest and heat/ice Relief from Meds: no selection  Mobility walk without assistance walk with assistance use a cane ability to climb steps?  yes do you drive?  no Do you have any goals in this area?  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 Heart Problems Sister   . Diabetes type II Sister   . Breast cancer Sister   . Multiple sclerosis Sister    Social History   Social History  . Marital status: Married    Spouse name: N/A  . Number of children: 2  . Years of education: N/A   Occupational History  . Retired     Former Doctor, hospital working part time in [employer]   Social History Main Topics  . Smoking status: Former Smoker    Packs/day: 0.50    Years: 25.00    Types: Cigarettes    Quit date: 05/04/1992  . Smokeless tobacco: Never Used  . Alcohol use No  . Drug use: No  . Sexual activity: Yes    Birth control/  protection: Surgical   Other Topics Concern  . None   Social History Narrative  . None   Past Surgical History:  Procedure Laterality Date  . HERNIA REPAIR  1946 and 1952  . KNEE SURGERY  12/2011   Left meniscus by Dr. Wylene Simmer in [place]  . Myocardial Perfusion Scan  07/29/2011   Normal  . ORIF ANKLE FRACTURE Left 1982  . ORIF ANKLE FRACTURE Right 10/09/2016   Procedure: OPEN REDUCTION INTERNAL FIXATION (ORIF) ANKLE FRACTURE;  Surgeon: Altamese Bordelonville, MD;  Location: [place];  Service: Orthopedics;  Laterality: Right;  . TIBIA IM NAIL INSERTION Right 10/09/2016   Procedure: INTRAMEDULLARY (IM) NAIL TIBIAL;  Surgeon: Altamese Wibaux, MD;  Location: [facility];  Service: Orthopedics;  Laterality: Right;  Marland Kitchen VASECTOMY  1972   Past Medical History:  Diagnosis Date  . Closed fracture of right tibial plafond with fibula involvement 10/06/2016  . 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 Fracture of tibial shaft, right, closed 10/08/2016  . History of ankle fracture 1982   spiral break; left ankle  . History of chicken pox   . History of measles   . Hypertension   . Vitamin D insufficiency 10/12/2016   BP 116/74   Pulse 80  Resp 14   SpO2 95%   Opioid Risk Score:   Fall Risk Score:  `1  Depression screen PHQ 2/9  Depression screen [facility] 2/9 11/06/2016 05/19/2016 05/15/2015  Decreased Interest 0 0 0  Down, Depressed, Hopeless 0 0 0  PHQ - 2 Score 0 0 0  Altered sleeping 1 - -  Tired, decreased energy 0 - -  Change in appetite 0 - -  Feeling bad or failure about yourself  0 - -  Trouble concentrating 0 - -  Moving slowly or fidgety/restless 0 - -  Suicidal thoughts 0 - -  PHQ-9 Score 1 - -  Difficult doing work/chores Somewhat difficult - -    Review of Systems  Constitutional: Negative.   HENT: Negative.   Eyes: Negative.   Respiratory: Negative.   Cardiovascular: Negative.   Gastrointestinal: Negative.   Endocrine: Negative.   Genitourinary: Negative.    Musculoskeletal: Positive for gait problem.       Spasms   Skin: Negative.   Allergic/Immunologic: Negative.   Hematological: Negative.   Psychiatric/Behavioral: Negative.   All other systems reviewed and are negative.      Objective:   Physical Exam Constitutional: He appears well-developed and well-nourished. NAD.  HENT: Normocephalic and atraumatic.  Eyes: EOMI. No discharge.  Cardiovascular: RRR.  No JVD. Respiratory: Effort normal and breath sounds normal.  GI: Soft. Bowel sounds are normal.  Musculoskeletal: He exhibits edema and tenderness.  Neurological: He is alert and oriented.  Sensation intact to light touch Motor: B/l UE, LLE 5/5 proximal to distal RLE: HF, KE, ADF/PF 5/5 Skin: Skin is warm and dry. Intact.    Assessment & Plan:  76 y.o. male with history of HTN, prediabetes with presents for who presents for follow up for right tibial fracture.    1. Deficits in mobility as well as decreased ability to carry out ADL tasks secondary to right tibial shaft fracture and knee effusion.  Cont outpatient therapies, completed HH  Cont Ortho follow up  Pt would like to be released from PM&R  2. Pain Management:   Controlled  Cont meds  3. Abnormality of gait  Cont cane for safety  4. Constipation  Cont Miralax  Will colace

## 2017-01-14 DIAGNOSIS — H401131 Primary open-angle glaucoma, bilateral, mild stage: Secondary | ICD-10-CM | POA: Diagnosis not present

## 2017-01-14 DIAGNOSIS — M79604 Pain in right leg: Secondary | ICD-10-CM | POA: Diagnosis not present

## 2017-01-20 DIAGNOSIS — M79604 Pain in right leg: Secondary | ICD-10-CM | POA: Diagnosis not present

## 2017-01-27 DIAGNOSIS — S82871D Displaced pilon fracture of right tibia, subsequent encounter for closed fracture with routine healing: Secondary | ICD-10-CM | POA: Diagnosis not present

## 2017-01-27 DIAGNOSIS — M79604 Pain in right leg: Secondary | ICD-10-CM | POA: Diagnosis not present

## 2017-02-03 DIAGNOSIS — M79604 Pain in right leg: Secondary | ICD-10-CM | POA: Diagnosis not present

## 2017-02-10 DIAGNOSIS — M79604 Pain in right leg: Secondary | ICD-10-CM | POA: Diagnosis not present

## 2017-02-17 DIAGNOSIS — M79604 Pain in right leg: Secondary | ICD-10-CM | POA: Diagnosis not present

## 2017-02-24 DIAGNOSIS — S82871D Displaced pilon fracture of right tibia, subsequent encounter for closed fracture with routine healing: Secondary | ICD-10-CM | POA: Diagnosis not present

## 2017-03-01 DIAGNOSIS — M79604 Pain in right leg: Secondary | ICD-10-CM | POA: Diagnosis not present

## 2017-03-10 DIAGNOSIS — M79604 Pain in right leg: Secondary | ICD-10-CM | POA: Diagnosis not present

## 2017-03-17 DIAGNOSIS — M79604 Pain in right leg: Secondary | ICD-10-CM | POA: Diagnosis not present

## 2017-03-24 DIAGNOSIS — M79604 Pain in right leg: Secondary | ICD-10-CM | POA: Diagnosis not present

## 2017-03-31 DIAGNOSIS — M79604 Pain in right leg: Secondary | ICD-10-CM | POA: Diagnosis not present

## 2017-04-07 DIAGNOSIS — M79604 Pain in right leg: Secondary | ICD-10-CM | POA: Diagnosis not present

## 2017-04-21 DIAGNOSIS — S82251D Displaced comminuted fracture of shaft of right tibia, subsequent encounter for closed fracture with routine healing: Secondary | ICD-10-CM | POA: Diagnosis not present

## 2017-04-21 DIAGNOSIS — S82461D Displaced segmental fracture of shaft of right fibula, subsequent encounter for closed fracture with routine healing: Secondary | ICD-10-CM | POA: Diagnosis not present

## 2017-04-21 DIAGNOSIS — S82871D Displaced pilon fracture of right tibia, subsequent encounter for closed fracture with routine healing: Secondary | ICD-10-CM | POA: Diagnosis not present

## 2017-04-21 DIAGNOSIS — M79604 Pain in right leg: Secondary | ICD-10-CM | POA: Diagnosis not present

## 2017-05-18 ENCOUNTER — Encounter: Payer: Medicare Other | Admitting: Family Medicine

## 2017-05-20 ENCOUNTER — Other Ambulatory Visit: Payer: Self-pay

## 2017-05-20 ENCOUNTER — Ambulatory Visit (INDEPENDENT_AMBULATORY_CARE_PROVIDER_SITE_OTHER): Payer: Medicare Other | Admitting: Family Medicine

## 2017-05-20 VITALS — BP 138/70 | HR 64 | Temp 97.9°F | Resp 16 | Wt 213.0 lb

## 2017-05-20 DIAGNOSIS — Z125 Encounter for screening for malignant neoplasm of prostate: Secondary | ICD-10-CM

## 2017-05-20 DIAGNOSIS — Z131 Encounter for screening for diabetes mellitus: Secondary | ICD-10-CM | POA: Diagnosis not present

## 2017-05-20 DIAGNOSIS — E781 Pure hyperglyceridemia: Secondary | ICD-10-CM

## 2017-05-20 DIAGNOSIS — Z0000 Encounter for general adult medical examination without abnormal findings: Secondary | ICD-10-CM

## 2017-05-20 NOTE — Progress Notes (Signed)
Subjective:     Patient ID: Patrick Good, male   DOB: 07/10/1940, 77 y.o.   MRN: [id number] Chief Complaint  Patient presents with  . Annual Exam   HPI States he continues to be followed by Dr. Marcelino Scot after Right lower extremity fracture/ORIF. Remains on Vitamin D otc to assist healing. Had lost a significant amount of weight intentionally after her was told his A1C was pre-diabetic.  Review of Systems General: Feeling well HEENT: regular dental visits and eye exams ([facility]) for glaucoma and early cataracts Cardiovascular: no chest pain, shortness of breath, or palpitations. Dizziness when first arising if he does not put his feet down first. No longer on bp medication after syncopal episode resulting in fracture. GI: no heartburn, no change in bowel habits or blood in the stool. GU: nocturia x 1-2. No change in bladder habits  Neuro: no change in memory or memory issues noted. Psychiatric: not depressed Musculoskeletal: right lower extremity numbness persists after surgery.    Objective:   Physical Exam  Constitutional: He appears well-developed and well-nourished. No distress.  Eyes: PERRLA, EOMI Neck: no thyromegaly, tenderness or nodules, no cervical adenopathy or carotid bruits ENT: TM's intact without inflammation; No tonsillar enlargement or exudate, Lungs: Clear Heart : RRR without murmur or gallop Abd: bowel sounds present, soft, non-tender, no organomegaly Rectal: Prostate firm and non-tender Extremities: no edema Skin: no atypical lesions noted. Seborrheic keratoses on his trunk.     Assessment:    1. Medicare annual wellness visit, subsequent  2. Pure hyperglyceridemia - Lipid panel  3. Screening for diabetes mellitus - Comprehensive metabolic panel - Hemoglobin A1c  4. Screening for prostate cancer - PSA    Plan:   Further follow up pending lab work.

## 2017-05-20 NOTE — Patient Instructions (Signed)
We will call you with the lab results. Do follow up with Dr. Marcelino Scot as scheduled.

## 2017-05-21 LAB — LIPID PANEL
CHOLESTEROL TOTAL: 179 mg/dL (ref 100–199)
Chol/HDL Ratio: 4 ratio (ref 0.0–5.0)
HDL: 45 mg/dL (ref >39)
LDL Calculated: 120 mg/dL — ABNORMAL HIGH (ref 0–99)
Triglycerides: 68 mg/dL (ref 0–149)
VLDL Cholesterol Cal: 14 mg/dL (ref 5–40)

## 2017-05-21 LAB — COMPREHENSIVE METABOLIC PANEL
ALT: 16 IU/L (ref 0–44)
AST: 13 IU/L (ref 0–40)
Albumin/Globulin Ratio: 1.7 (ref 1.2–2.2)
Albumin: 4.5 g/dL (ref 3.5–4.8)
Alkaline Phosphatase: 68 IU/L (ref 39–117)
BUN/Creatinine Ratio: 19 (ref 10–24)
BUN: 19 mg/dL (ref 8–27)
Bilirubin Total: 0.5 mg/dL (ref 0.0–1.2)
CALCIUM: 9.5 mg/dL (ref 8.6–10.2)
CO2: 25 mmol/L (ref 20–29)
Chloride: 106 mmol/L (ref 96–106)
Creatinine, Ser: 1.01 mg/dL (ref 0.76–1.27)
GFR calc Af Amer: 84 mL/min/{1.73_m2} (ref >59)
GFR, EST NON AFRICAN AMERICAN: 73 mL/min/{1.73_m2} (ref >59)
GLUCOSE: 103 mg/dL — AB (ref 65–99)
Globulin, Total: 2.6 g/dL (ref 1.5–4.5)
POTASSIUM: 4.7 mmol/L (ref 3.5–5.2)
Sodium: 145 mmol/L — ABNORMAL HIGH (ref 134–144)
Total Protein: 7.1 g/dL (ref 6.0–8.5)

## 2017-05-21 LAB — HEMOGLOBIN A1C
ESTIMATED AVERAGE GLUCOSE: 120 mg/dL
Hgb A1c MFr Bld: 5.8 % — ABNORMAL HIGH (ref 4.8–5.6)

## 2017-05-21 LAB — PSA: Prostate Specific Ag, Serum: 1.7 ng/mL (ref 0.0–4.0)

## 2017-07-13 DIAGNOSIS — H401131 Primary open-angle glaucoma, bilateral, mild stage: Secondary | ICD-10-CM | POA: Diagnosis not present

## 2017-07-21 DIAGNOSIS — S82251D Displaced comminuted fracture of shaft of right tibia, subsequent encounter for closed fracture with routine healing: Secondary | ICD-10-CM | POA: Diagnosis not present

## 2017-07-21 DIAGNOSIS — S82461D Displaced segmental fracture of shaft of right fibula, subsequent encounter for closed fracture with routine healing: Secondary | ICD-10-CM | POA: Diagnosis not present

## 2017-07-21 DIAGNOSIS — S82871D Displaced pilon fracture of right tibia, subsequent encounter for closed fracture with routine healing: Secondary | ICD-10-CM | POA: Diagnosis not present

## 2017-09-02 DIAGNOSIS — H2511 Age-related nuclear cataract, right eye: Secondary | ICD-10-CM | POA: Diagnosis not present

## 2017-09-10 NOTE — Discharge Instructions (Signed)

## 2017-09-15 ENCOUNTER — Ambulatory Visit: Payer: Medicare Other | Admitting: Anesthesiology

## 2017-09-15 ENCOUNTER — Ambulatory Visit
Admission: RE | Admit: 2017-09-15 | Discharge: 2017-09-15 | Disposition: A | Discharge status: Home / Self Care | Payer: Medicare Other | Source: Ambulatory Visit | Attending: Ophthalmology | Admitting: Ophthalmology

## 2017-09-15 ENCOUNTER — Encounter
Admission: RE | Disposition: A | Discharge status: Home / Self Care | Payer: Self-pay | Source: Ambulatory Visit | Attending: Ophthalmology

## 2017-09-15 DIAGNOSIS — H2511 Age-related nuclear cataract, right eye: Secondary | ICD-10-CM | POA: Insufficient documentation

## 2017-09-15 DIAGNOSIS — I10 Essential (primary) hypertension: Secondary | ICD-10-CM | POA: Insufficient documentation

## 2017-09-15 DIAGNOSIS — Z87891 Personal history of nicotine dependence: Secondary | ICD-10-CM | POA: Insufficient documentation

## 2017-09-15 DIAGNOSIS — H25811 Combined forms of age-related cataract, right eye: Secondary | ICD-10-CM | POA: Diagnosis not present

## 2017-09-15 HISTORY — PX: CATARACT EXTRACTION W/PHACO: SHX586

## 2017-09-15 SURGERY — PHACOEMULSIFICATION, CATARACT, WITH IOL INSERTION
Anesthesia: Monitor Anesthesia Care | Site: Eye | Laterality: Right | Wound class: "Clean "

## 2017-09-15 MED ORDER — LACTATED RINGERS IV SOLN: INTRAVENOUS | Status: DC

## 2017-09-15 MED ORDER — LIDOCAINE HCL (PF) 2 % IJ SOLN
INTRAOCULAR | Status: DC | PRN
  Administered 2017-09-15: 1 mL

## 2017-09-15 MED ORDER — FENTANYL CITRATE (PF) 100 MCG/2ML IJ SOLN
INTRAMUSCULAR | Status: DC | PRN
  Administered 2017-09-15: 50 ug via INTRAVENOUS

## 2017-09-15 MED ORDER — CEFUROXIME OPHTHALMIC INJECTION 1 MG/0.1 ML
INJECTION | OPHTHALMIC | Status: DC | PRN
  Administered 2017-09-15: 0.1 mL via INTRACAMERAL

## 2017-09-15 MED ORDER — NA HYALUR & NA CHOND-NA HYALUR 0.4-0.35 ML IO KIT
PACK | INTRAOCULAR | Status: DC | PRN
  Administered 2017-09-15: 1 mL via INTRAOCULAR

## 2017-09-15 MED ORDER — EPINEPHRINE PF 1 MG/ML IJ SOLN
INTRAOCULAR | Status: DC | PRN
  Administered 2017-09-15: 48 mL via OPHTHALMIC

## 2017-09-15 MED ORDER — MOXIFLOXACIN HCL 0.5 % OP SOLN
1.0000 [drp] | OPHTHALMIC | Status: DC | PRN
  Administered 2017-09-15 (×3): 1 [drp] via OPHTHALMIC

## 2017-09-15 MED ORDER — BRIMONIDINE TARTRATE-TIMOLOL 0.2-0.5 % OP SOLN
OPHTHALMIC | Status: DC | PRN
  Administered 2017-09-15: 1 [drp] via OPHTHALMIC

## 2017-09-15 MED ORDER — MIDAZOLAM HCL 2 MG/2ML IJ SOLN
INTRAMUSCULAR | Status: DC | PRN
  Administered 2017-09-15 (×2): 1 mg via INTRAVENOUS

## 2017-09-15 MED ORDER — ARMC OPHTHALMIC DILATING DROPS
1.0000 "application " | OPHTHALMIC | Status: DC | PRN
  Administered 2017-09-15 (×3): 1 via OPHTHALMIC

## 2017-09-15 SURGICAL SUPPLY — 20 items
ACRYSOF IQ TORIC IOC (Intraocular Lens) ×2 IMPLANT
CANNULA ANT/CHMB 27G (MISCELLANEOUS) ×1 IMPLANT
CANNULA ANT/CHMB 27GA (MISCELLANEOUS) ×3 IMPLANT
GLOVE SURG LX 7.5 STRW (GLOVE) ×2
GLOVE SURG LX STRL 7.5 STRW (GLOVE) ×1 IMPLANT
GLOVE SURG TRIUMPH 8.0 PF LTX (GLOVE) ×3 IMPLANT
GOWN STRL REUS W/ TWL LRG LVL3 (GOWN DISPOSABLE) ×2 IMPLANT
GOWN STRL REUS W/TWL LRG LVL3 (GOWN DISPOSABLE) ×4
MARKER SKIN DUAL TIP RULER LAB (MISCELLANEOUS) ×3 IMPLANT
NDL FILTER BLUNT 18X1 1/2 (NEEDLE) ×1 IMPLANT
NEEDLE FILTER BLUNT 18X 1/2SAF (NEEDLE) ×2
NEEDLE FILTER BLUNT 18X1 1/2 (NEEDLE) ×1 IMPLANT
PACK CATARACT BRASINGTON (MISCELLANEOUS) ×3 IMPLANT
PACK EYE AFTER SURG (MISCELLANEOUS) ×3 IMPLANT
PACK OPTHALMIC (MISCELLANEOUS) ×3 IMPLANT
SYR 3ML LL SCALE MARK (SYRINGE) ×3 IMPLANT
SYR 5ML LL (SYRINGE) ×3 IMPLANT
SYR TB 1ML LUER SLIP (SYRINGE) ×3 IMPLANT
WATER STERILE IRR 500ML POUR (IV SOLUTION) ×3 IMPLANT
WIPE NON LINTING 3.25X3.25 (MISCELLANEOUS) ×3 IMPLANT

## 2017-09-15 NOTE — Anesthesia Procedure Notes (Signed)
Date/Time: 09/15/2017 10:29 AM Performed by: Cameron Ali, CRNA Pre-anesthesia Checklist: Patient identified, Emergency Drugs available, Suction available, Timeout performed and Patient being monitored Patient Re-evaluated:Patient Re-evaluated prior to induction Oxygen Delivery Method: Nasal cannula Placement Confirmation: positive ETCO2

## 2017-09-15 NOTE — H&P (Signed)
The History and Physical notes are on paper, have been signed, and are to be scanned. The patient remains stable and unchanged from the H&P.   Previous H&P reviewed, patient examined, and there are no changes.  Patrick Good 09/15/2017 10:05 AM

## 2017-09-15 NOTE — Op Note (Signed)
LOCATION:  [facility]   PREOPERATIVE DIAGNOSIS:  Nuclear sclerotic cataract of the right eye.  H25.11   POSTOPERATIVE DIAGNOSIS:  Nuclear sclerotic cataract of the right eye.   PROCEDURE:  Phacoemulsification with Toric posterior chamber intraocular lens placement of the right eye.   LENS:   Implant Name Type Inv. Item Serial No. Manufacturer Lot No. LRB No. Used  ACRYSOF IQ TORIC IOC Intraocular Lens  [id number]   Right 1     [id number] 19.0 D Toric intraocular lens with 1.5 diopters of cylindrical power with axis orientation at 47 degrees.   ULTRASOUND TIME: 12 % of 0 minutes, 43 seconds.  CDE 5.2   SURGEON:  Wyonia Hough, MD   ANESTHESIA: Topical with tetracaine drops and 2% Xylocaine jelly, augmented with 1% preservative-free intracameral lidocaine. .   COMPLICATIONS:  None.   DESCRIPTION OF PROCEDURE:  The patient was identified in the holding room and transported to the operating suite and placed in the supine position under the operating microscope.  The right eye was identified as the operative eye, and it was prepped and draped in the usual sterile ophthalmic fashion.    A clear-corneal paracentesis incision was made at the 12:00 position.  0.5 ml of preservative-free 1% lidocaine was injected into the anterior chamber. The anterior chamber was filled with Viscoat.  A 2.4 millimeter near clear corneal incision was then made at the 9:00 position.  A cystotome and capsulorrhexis forceps were then used to make a curvilinear capsulorrhexis.  Hydrodissection and hydrodelineation were then performed using balanced salt solution.   Phacoemulsification was then used in stop and chop fashion to remove the lens, nucleus and epinucleus.  The remaining cortex was aspirated using the irrigation and aspiration handpiece.  Provisc viscoelastic was then placed into the capsular bag to distend it for lens placement.  The Verion digital marker was used to align the implant at the  intended axis.   A Toric lens was then injected into the capsular bag.  It was rotated clockwise until the axis marks on the lens were approximately 15 degrees in the counterclockwise direction to the intended alignment.  The viscoelastic was aspirated from the eye using the irrigation aspiration handpiece.  Then, a Koch spatula through the sideport incision was used to rotate the lens in a clockwise direction until the axis markings of the intraocular lens were lined up with the Verion alignment.  Balanced salt solution was then used to hydrate the wounds. Cefuroxime 0.1 ml of a 10mg /ml solution was injected into the anterior chamber for a dose of 1 mg of intracameral antibiotic at the completion of the case.    The eye was noted to have a physiologic pressure and there was no wound leak noted.   Timolol and Brimonidine drops were applied to the eye.  The patient was taken to the recovery room in stable condition having had no complications of anesthesia or surgery.  Jayanth Szczesniak 09/15/2017, 10:45 AM

## 2017-09-15 NOTE — Anesthesia Preprocedure Evaluation (Signed)
Anesthesia Evaluation  Patient identified by MRN, date of birth, ID band Patient awake    Reviewed: Allergy & Precautions, NPO status , Patient's Chart, lab work & pertinent test results  History of Anesthesia Complications Negative for: history of anesthetic complications  Airway Mallampati: I  TM Distance: >3 FB Neck ROM: Full    Dental  (+)    Pulmonary former smoker (quit 1994),    Pulmonary exam normal breath sounds clear to auscultation       Cardiovascular Exercise Tolerance: Good negative cardio ROS Normal cardiovascular exam Rhythm:Regular Rate:Normal  Hx HTN, resolved after weight loss and no longer on medication   Neuro/Psych negative neurological ROS     GI/Hepatic negative GI ROS,   Endo/Other  negative endocrine ROS  Renal/GU negative Renal ROS     Musculoskeletal   Abdominal   Peds  Hematology negative hematology ROS (+)   Anesthesia Other Findings   Reproductive/Obstetrics                             Anesthesia Physical Anesthesia Plan  ASA: II  Anesthesia Plan: MAC   Post-op Pain Management:    Induction: Intravenous  PONV Risk Score and Plan: 1 and TIVA and Midazolam  Airway Management Planned: Natural Airway  Additional Equipment:   Intra-op Plan:   Post-operative Plan:   Informed Consent: I have reviewed the patients History and Physical, chart, labs and discussed the procedure including the risks, benefits and alternatives for the proposed anesthesia with the patient or authorized representative who has indicated his/her understanding and acceptance.     Plan Discussed with: CRNA  Anesthesia Plan Comments:         Anesthesia Quick Evaluation

## 2017-09-15 NOTE — Anesthesia Postprocedure Evaluation (Signed)
Anesthesia Post Note  Patient: Patrick Good  Procedure(s) Performed: CATARACT EXTRACTION PHACO AND INTRAOCULAR LENS PLACEMENT (IOC)  RIGHT TORIC (Right Eye)  Patient location during evaluation: PACU Anesthesia Type: MAC Level of consciousness: awake and alert, oriented and patient cooperative Pain management: pain level controlled Vital Signs Assessment: post-procedure vital signs reviewed and stable Respiratory status: spontaneous breathing, nonlabored ventilation and respiratory function stable Cardiovascular status: blood pressure returned to baseline and stable Postop Assessment: adequate PO intake Anesthetic complications: no    Darrin Nipper

## 2017-09-15 NOTE — Transfer of Care (Signed)
Immediate Anesthesia Transfer of Care Note  Patient: Patrick Good  Procedure(s) Performed: CATARACT EXTRACTION PHACO AND INTRAOCULAR LENS PLACEMENT (IOC)  RIGHT TORIC (Right Eye)  Patient Location: PACU  Anesthesia Type: MAC  Level of Consciousness: awake, alert  and patient cooperative  Airway and Oxygen Therapy: Patient Spontanous Breathing and Patient connected to supplemental oxygen  Post-op Assessment: Post-op Vital signs reviewed, Patient's Cardiovascular Status Stable, Respiratory Function Stable, Patent Airway and No signs of Nausea or vomiting  Post-op Vital Signs: Reviewed and stable  Complications: No apparent anesthesia complications

## 2017-09-16 ENCOUNTER — Encounter: Payer: Self-pay | Admitting: Ophthalmology

## 2017-11-01 ENCOUNTER — Encounter: Payer: Self-pay | Admitting: *Deleted

## 2017-11-01 ENCOUNTER — Other Ambulatory Visit: Payer: Self-pay

## 2017-11-01 NOTE — Discharge Instructions (Signed)

## 2017-11-08 MED ORDER — ONDANSETRON HCL 4 MG/2ML IJ SOLN: 4.0000 mg | Freq: Once | INTRAMUSCULAR | Status: DC | PRN

## 2017-11-08 MED ORDER — ACETAMINOPHEN 160 MG/5ML PO SOLN: 325.0000 mg | ORAL | Status: DC | PRN

## 2017-11-08 MED ORDER — ACETAMINOPHEN 325 MG PO TABS: 650.0000 mg | ORAL_TABLET | Freq: Once | ORAL | Status: DC | PRN

## 2017-11-09 ENCOUNTER — Encounter
Admission: RE | Disposition: A | Discharge status: Home / Self Care | Payer: Self-pay | Source: Ambulatory Visit | Attending: Ophthalmology

## 2017-11-09 ENCOUNTER — Ambulatory Visit: Payer: Medicare Other | Admitting: Anesthesiology

## 2017-11-09 ENCOUNTER — Ambulatory Visit
Admission: RE | Admit: 2017-11-09 | Discharge: 2017-11-09 | Disposition: A | Discharge status: Home / Self Care | Payer: Medicare Other | Source: Ambulatory Visit | Attending: Ophthalmology | Admitting: Ophthalmology

## 2017-11-09 DIAGNOSIS — H25812 Combined forms of age-related cataract, left eye: Secondary | ICD-10-CM | POA: Diagnosis not present

## 2017-11-09 DIAGNOSIS — Z888 Allergy status to other drugs, medicaments and biological substances status: Secondary | ICD-10-CM | POA: Insufficient documentation

## 2017-11-09 DIAGNOSIS — Z7982 Long term (current) use of aspirin: Secondary | ICD-10-CM | POA: Insufficient documentation

## 2017-11-09 DIAGNOSIS — H2512 Age-related nuclear cataract, left eye: Secondary | ICD-10-CM | POA: Insufficient documentation

## 2017-11-09 DIAGNOSIS — Z79899 Other long term (current) drug therapy: Secondary | ICD-10-CM | POA: Diagnosis not present

## 2017-11-09 DIAGNOSIS — Z87891 Personal history of nicotine dependence: Secondary | ICD-10-CM | POA: Insufficient documentation

## 2017-11-09 HISTORY — PX: CATARACT EXTRACTION W/PHACO: SHX586

## 2017-11-09 SURGERY — PHACOEMULSIFICATION, CATARACT, WITH IOL INSERTION
Anesthesia: Monitor Anesthesia Care | Site: Eye | Laterality: Left | Wound class: "Clean "

## 2017-11-09 MED ORDER — ACETAMINOPHEN 325 MG PO TABS: 975.0000 mg | ORAL_TABLET | Freq: Once | ORAL | Status: DC | PRN

## 2017-11-09 MED ORDER — CYCLOPENTOLATE HCL 2 % OP SOLN
1.0000 [drp] | OPHTHALMIC | Status: DC | PRN
  Administered 2017-11-09 (×4): 1 [drp] via OPHTHALMIC

## 2017-11-09 MED ORDER — CEFUROXIME OPHTHALMIC INJECTION 1 MG/0.1 ML
INJECTION | OPHTHALMIC | Status: DC | PRN
  Administered 2017-11-09: .3 mL via OPHTHALMIC

## 2017-11-09 MED ORDER — MOXIFLOXACIN HCL 0.5 % OP SOLN
1.0000 [drp] | OPHTHALMIC | Status: DC | PRN
  Administered 2017-11-09 (×3): 1 [drp] via OPHTHALMIC

## 2017-11-09 MED ORDER — LACTATED RINGERS IV SOLN: 1000.0000 mL | INTRAVENOUS | Status: DC

## 2017-11-09 MED ORDER — LIDOCAINE HCL (PF) 2 % IJ SOLN
INTRAOCULAR | Status: DC | PRN
  Administered 2017-11-09: 1 mL via INTRAMUSCULAR

## 2017-11-09 MED ORDER — MIDAZOLAM HCL 2 MG/2ML IJ SOLN
INTRAMUSCULAR | Status: DC | PRN
  Administered 2017-11-09: 2 mg via INTRAVENOUS

## 2017-11-09 MED ORDER — EPINEPHRINE PF 1 MG/ML IJ SOLN
INTRAOCULAR | Status: DC | PRN
  Administered 2017-11-09: 64 mL via OPHTHALMIC

## 2017-11-09 MED ORDER — TETRACAINE HCL 0.5 % OP SOLN
1.0000 [drp] | OPHTHALMIC | Status: DC | PRN
  Administered 2017-11-09 (×2): 1 [drp] via OPHTHALMIC

## 2017-11-09 MED ORDER — BRIMONIDINE TARTRATE-TIMOLOL 0.2-0.5 % OP SOLN
OPHTHALMIC | Status: DC | PRN
  Administered 2017-11-09: 1 [drp] via OPHTHALMIC

## 2017-11-09 MED ORDER — NA HYALUR & NA CHOND-NA HYALUR 0.4-0.35 ML IO KIT
PACK | INTRAOCULAR | Status: DC | PRN
  Administered 2017-11-09: 1 mL via INTRAOCULAR

## 2017-11-09 MED ORDER — PHENYLEPHRINE HCL 10 % OP SOLN
1.0000 [drp] | OPHTHALMIC | Status: DC | PRN
  Administered 2017-11-09 (×4): 1 [drp] via OPHTHALMIC

## 2017-11-09 MED ORDER — FENTANYL CITRATE (PF) 100 MCG/2ML IJ SOLN
INTRAMUSCULAR | Status: DC | PRN
  Administered 2017-11-09: 50 ug via INTRAVENOUS

## 2017-11-09 SURGICAL SUPPLY — 28 items
CANNULA ANT/CHMB 27G (MISCELLANEOUS) ×1 IMPLANT
CANNULA ANT/CHMB 27GA (MISCELLANEOUS) ×3 IMPLANT
CARTRIDGE ABBOTT (MISCELLANEOUS) IMPLANT
GLOVE SURG LX 7.5 STRW (GLOVE) ×2
GLOVE SURG LX STRL 7.5 STRW (GLOVE) ×1 IMPLANT
GLOVE SURG TRIUMPH 8.0 PF LTX (GLOVE) ×3 IMPLANT
GOWN STRL REUS W/ TWL LRG LVL3 (GOWN DISPOSABLE) ×2 IMPLANT
GOWN STRL REUS W/TWL LRG LVL3 (GOWN DISPOSABLE) ×4
LENS IOL ACRSF IQ ULTRA 18.5 (Intraocular Lens) IMPLANT
LENS IOL ACRYSOF IQ 18.5 (Intraocular Lens) ×3 IMPLANT
MARKER SKIN DUAL TIP RULER LAB (MISCELLANEOUS) ×3 IMPLANT
NDL FILTER BLUNT 18X1 1/2 (NEEDLE) ×1 IMPLANT
NDL RETROBULBAR .5 NSTRL (NEEDLE) IMPLANT
NEEDLE FILTER BLUNT 18X 1/2SAF (NEEDLE) ×2
NEEDLE FILTER BLUNT 18X1 1/2 (NEEDLE) ×1 IMPLANT
PACK CATARACT BRASINGTON (MISCELLANEOUS) ×3 IMPLANT
PACK EYE AFTER SURG (MISCELLANEOUS) ×3 IMPLANT
PACK OPTHALMIC (MISCELLANEOUS) ×3 IMPLANT
RING MALYGIN 7.0 (MISCELLANEOUS) IMPLANT
SUT ETHILON 10-0 CS-B-6CS-B-6 (SUTURE)
SUT VICRYL  9 0 (SUTURE)
SUT VICRYL 9 0 (SUTURE) IMPLANT
SUTURE EHLN 10-0 CS-B-6CS-B-6 (SUTURE) IMPLANT
SYR 3ML LL SCALE MARK (SYRINGE) ×3 IMPLANT
SYR 5ML LL (SYRINGE) ×3 IMPLANT
SYR TB 1ML LUER SLIP (SYRINGE) ×3 IMPLANT
WATER STERILE IRR 500ML POUR (IV SOLUTION) ×3 IMPLANT
WIPE NON LINTING 3.25X3.25 (MISCELLANEOUS) ×3 IMPLANT

## 2017-11-09 NOTE — Anesthesia Preprocedure Evaluation (Addendum)
Anesthesia Evaluation  Patient identified by MRN, date of birth, ID band Patient awake    Reviewed: Allergy & Precautions, NPO status , Patient's Chart, lab work & pertinent test results, reviewed documented beta blocker date and time   Airway Mallampati: II  TM Distance: >3 FB Neck ROM: Full    Dental no notable dental hx.    Pulmonary former smoker,    Pulmonary exam normal breath sounds clear to auscultation       Cardiovascular hypertension, Normal cardiovascular exam Rhythm:Regular Rate:Normal     Neuro/Psych negative psych ROS   GI/Hepatic negative GI ROS, Neg liver ROS,   Endo/Other  negative endocrine ROS  Renal/GU negative Renal ROS     Musculoskeletal negative musculoskeletal ROS (+)   Abdominal Normal abdominal exam  (+)   Peds  Hematology negative hematology ROS (+)   Anesthesia Other Findings   Reproductive/Obstetrics                            Anesthesia Physical Anesthesia Plan  ASA: II  Anesthesia Plan: MAC   Post-op Pain Management:    Induction: Intravenous  PONV Risk Score and Plan:   Airway Management Planned: Natural Airway  Additional Equipment: None  Intra-op Plan:   Post-operative Plan:   Informed Consent: I have reviewed the patients History and Physical, chart, labs and discussed the procedure including the risks, benefits and alternatives for the proposed anesthesia with the patient or authorized representative who has indicated his/her understanding and acceptance.     Plan Discussed with: Surgeon, Anesthesiologist and CRNA  Anesthesia Plan Comments:         Anesthesia Quick Evaluation

## 2017-11-09 NOTE — Transfer of Care (Signed)
Immediate Anesthesia Transfer of Care Note  Patient: Patrick Good  Procedure(s) Performed: CATARACT EXTRACTION PHACO AND INTRAOCULAR LENS PLACEMENT (IOC) LEFT IVA TOPICAL (Left Eye)  Patient Location: PACU  Anesthesia Type: MAC  Level of Consciousness: awake, alert  and patient cooperative  Airway and Oxygen Therapy: Patient Spontanous Breathing and Patient connected to supplemental oxygen  Post-op Assessment: Post-op Vital signs reviewed, Patient's Cardiovascular Status Stable, Respiratory Function Stable, Patent Airway and No signs of Nausea or vomiting  Post-op Vital Signs: Reviewed and stable  Complications: No apparent anesthesia complications

## 2017-11-09 NOTE — Anesthesia Postprocedure Evaluation (Signed)
Anesthesia Post Note  Patient: Patrick Good  Procedure(s) Performed: CATARACT EXTRACTION PHACO AND INTRAOCULAR LENS PLACEMENT (IOC) LEFT IVA TOPICAL (Left Eye)  Patient location during evaluation: PACU Anesthesia Type: MAC Level of consciousness: awake Pain management: pain level controlled Vital Signs Assessment: post-procedure vital signs reviewed and stable Respiratory status: spontaneous breathing Cardiovascular status: blood pressure returned to baseline Postop Assessment: no headache Anesthetic complications: no    Lavonna Monarch

## 2017-11-09 NOTE — Op Note (Signed)
OPERATIVE NOTE  KEDARIUS ALOISI [id number] 11/09/2017   PREOPERATIVE DIAGNOSIS:  Nuclear sclerotic cataract left eye. H25.12   POSTOPERATIVE DIAGNOSIS:    Nuclear sclerotic cataract left eye.     PROCEDURE:  Phacoemusification with posterior chamber intraocular lens placement of the left eye   LENS:   Implant Name Type Inv. Item Serial No. Manufacturer Lot No. LRB No. Used  LENS IOL ACRYSOF IQ 18.5 - [id number] Intraocular Lens LENS IOL ACRYSOF IQ 18.5 [id number] ALCON  Left 1        ULTRASOUND TIME: 13  % of 0 minutes 51 seconds, CDE 6.4  SURGEON:  Wyonia Hough, MD   ANESTHESIA:  Topical with tetracaine drops and 2% Xylocaine jelly, augmented with 1% preservative-free intracameral lidocaine.    COMPLICATIONS:  None.   DESCRIPTION OF PROCEDURE:  The patient was identified in the holding room and transported to the operating room and placed in the supine position under the operating microscope.  The left eye was identified as the operative eye and it was prepped and draped in the usual sterile ophthalmic fashion.   A 1 millimeter clear-corneal paracentesis was made at the 1:30 position.  0.5 ml of preservative-free 1% lidocaine was injected into the anterior chamber.  The anterior chamber was filled with Viscoat viscoelastic.  A 2.4 millimeter keratome was used to make a near-clear corneal incision at the 10:30 position.  .  A curvilinear capsulorrhexis was made with a cystotome and capsulorrhexis forceps.  Balanced salt solution was used to hydrodissect and hydrodelineate the nucleus.   Phacoemulsification was then used in stop and chop fashion to remove the lens nucleus and epinucleus.  The remaining cortex was then removed using the irrigation and aspiration handpiece. Provisc was then placed into the capsular bag to distend it for lens placement.  A lens was then injected into the capsular bag.  The remaining viscoelastic was aspirated.   Wounds were hydrated with  balanced salt solution.  The anterior chamber was inflated to a physiologic pressure with balanced salt solution.  No wound leaks were noted. Cefuroxime 0.1 ml of a 10mg /ml solution was injected into the anterior chamber for a dose of 1 mg of intracameral antibiotic at the completion of the case.   Timolol and Brimonidine drops were applied to the eye.  The patient was taken to the recovery room in stable condition without complications of anesthesia or surgery.  Amorie Rentz 11/09/2017, 9:39 AM

## 2017-11-09 NOTE — Anesthesia Procedure Notes (Signed)
Procedure Name: MAC Date/Time: 11/09/2017 9:23 AM Performed by: Janna Arch, CRNA Pre-anesthesia Checklist: Patient identified, Emergency Drugs available, Suction available and Patient being monitored Patient Re-evaluated:Patient Re-evaluated prior to induction Oxygen Delivery Method: Nasal cannula

## 2017-11-09 NOTE — H&P (Signed)
The History and Physical notes are on paper, have been signed, and are to be scanned. The patient remains stable and unchanged from the H&P.   Previous H&P reviewed, patient examined, and there are no changes.  Patrick Good 11/09/2017 8:49 AM

## 2017-11-10 ENCOUNTER — Encounter: Payer: Self-pay | Admitting: Ophthalmology

## 2017-12-08 DIAGNOSIS — S82461D Displaced segmental fracture of shaft of right fibula, subsequent encounter for closed fracture with routine healing: Secondary | ICD-10-CM | POA: Diagnosis not present

## 2017-12-08 DIAGNOSIS — T8484XA Pain due to internal orthopedic prosthetic devices, implants and grafts, initial encounter: Secondary | ICD-10-CM | POA: Diagnosis not present

## 2017-12-08 DIAGNOSIS — S82871D Displaced pilon fracture of right tibia, subsequent encounter for closed fracture with routine healing: Secondary | ICD-10-CM | POA: Diagnosis not present

## 2017-12-31 ENCOUNTER — Other Ambulatory Visit: Payer: Self-pay

## 2017-12-31 ENCOUNTER — Encounter (HOSPITAL_COMMUNITY): Payer: Self-pay | Admitting: *Deleted

## 2017-12-31 NOTE — Progress Notes (Signed)
Spoke with pt for pre-op call. Pt denies cardiac history. Pt has been treated in the past for HTN with Lisinopril. In 2018 pt lost 30 lbs. He states he started having dizzy spells. Spoke with his PCP and was told he was just getting old. A few days later he was going upstairs, got dizzy, passed out and broke if tib/fib. States his blood was so low he was rushed to the ED. Pt was taken off of Lisinopril at that time and has not had to have any other medication. Pt states he is pre-diabetic. Last A1C was 5.8 in January, 2019.

## 2018-01-03 NOTE — H&P (Signed)
Orthopaedic Trauma Service (OTS) Consult   Patient ID: Patrick Good MRN: [id number] DOB/AGE: 1940-09-22 77 y.o.   HPI: Patrick Good is an 78 y.o. white male who presents for removal of hardware from his right tibia.  Patient sustained an injury back in June 2018 which resulted in a right tibial shaft and tibial plafond fracture.  Patient was treated with intramedullary nailing.  Patient has recovered well however he continues to have symptoms consistent with symptomatic hardware including anterior knee pain as well as discomfort over his locking bolts.  Patient has tried all conservative measures with marginal success.  His pain can be quite incapacitating at times and at this point he wishes to proceed with hardware removal.  We discussed the risks and benefits related to surgery as well as the possibility that his symptoms will not be resolved with hardware removal.  Patient acknowledges the risks and wishes to proceed with surgery.  Past Medical History:  Diagnosis Date  . Arthritis    right index finger, left ankle  . Closed fracture of right tibial plafond with fibula involvement 10/06/2016  . 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 Fracture of tibial shaft, right, closed 10/08/2016  . GERD (gastroesophageal reflux disease)    20 years ago  . History of ankle fracture 1982   spiral break; left ankle  . History of chicken pox   . History of measles   . Hypertension   . Pre-diabetes   . Vitamin D insufficiency 10/12/2016    Past Surgical History:  Procedure Laterality Date  . CATARACT EXTRACTION W/PHACO Right 09/15/2017   Procedure: CATARACT EXTRACTION PHACO AND INTRAOCULAR LENS PLACEMENT ([place])  RIGHT TORIC;  Surgeon: Leandrew Koyanagi, MD;  Location: [place];  Service: Ophthalmology;  Laterality: Right;  PT WANTS TO ARRIVE BETWEEN 9:30 AND 10  . CATARACT EXTRACTION W/PHACO Left 11/09/2017   Procedure: CATARACT EXTRACTION PHACO AND  INTRAOCULAR LENS PLACEMENT ([place]) LEFT IVA TOPICAL;  Surgeon: Leandrew Koyanagi, MD;  Location: [place];  Service: Ophthalmology;  Laterality: Left;  prefers arrival between 9 and 10  . HERNIA REPAIR  1946 and 1952  . KNEE SURGERY  12/2011   Left meniscus by Dr. Wylene Simmer in [place]  . Myocardial Perfusion Scan  07/29/2011   Normal  . ORIF ANKLE FRACTURE Left 1982  . ORIF ANKLE FRACTURE Right 10/09/2016   Procedure: OPEN REDUCTION INTERNAL FIXATION (ORIF) ANKLE FRACTURE;  Surgeon: Altamese [place], MD;  Location: [place];  Service: Orthopedics;  Laterality: Right;  . TIBIA IM NAIL INSERTION Right 10/09/2016   Procedure: INTRAMEDULLARY (IM) NAIL TIBIAL;  Surgeon: Altamese Viburnum, MD;  Location: [place];  Service: Orthopedics;  Laterality: Right;  Marland Kitchen VASECTOMY  1972    Family History  Problem Relation Age of Onset  . Heart Problems Sister   . Diabetes type II Sister   . Breast cancer Sister   . Multiple sclerosis Sister     Social History:  reports that he quit smoking about 25 years ago. His smoking use included cigarettes. He has a 12.50 pack-year smoking history. He has never used smokeless tobacco. He reports that he does not drink alcohol or use drugs.  Allergies:  Allergies  Allergen Reactions  . Tape Rash and Other (See Comments)    Plastic     Medications:  Current Meds  Medication Sig  . acetaminophen (TYLENOL) 325 MG tablet Take 325 mg by mouth every 6 (six) hours as needed  for moderate pain or headache.  Marland Kitchen aspirin EC 81 MG tablet Take 81 mg by mouth daily.  . Cholecalciferol (VITAMIN D3 PO) Take 1 capsule by mouth daily.  Marland Kitchen latanoprost (XALATAN) 0.005 % ophthalmic solution Place 1 drop into both eyes at bedtime.   . multivitamin-lutein (OCUVITE-LUTEIN) CAPS capsule Take 1 capsule by mouth daily.  . Polyvinyl Alcohol-Povidone PF (REFRESH) 1.4-0.6 % SOLN Place 1 drop into both eyes 4 (four) times daily as needed (for irritation).     Labs pending   Review of  Systems  Constitutional: Negative for chills and fever.  Respiratory: Negative for cough and shortness of breath.   Cardiovascular: Negative for chest pain and palpitations.  Gastrointestinal: Negative for nausea and vomiting.  Musculoskeletal:       R knee pain   Neurological: Negative for tingling and sensory change.   Vitals on arrival to short stay  Physical Exam  Constitutional: He is oriented to person, place, and time. He appears well-developed and well-nourished. No distress.  HENT:  Head: Normocephalic and atraumatic.  Cardiovascular: Normal rate and regular rhythm.  Pulmonary/Chest: Effort normal and breath sounds normal. No respiratory distress.  Abdominal: Soft. Bowel sounds are normal.  Musculoskeletal:  Right lower extremity All surgical wounds are well-healed to the right lower leg Diffuse anterior knee pain Tenderness over his locking most primarily proximally but some distally as well. Nontender over his previous fracture sites including his ankle. Patient has excellent ankle and knee range of motion Ambulates without a limp, no antalgic gait DPN, SPN, TN sensory functions are intact EHL, FHL, lesser toe motor functions are intact.  Ankle flexion extension, inversion, eversion are intact patient can perform a quadriceps set.  Has full active knee range of motion without extensor lag.  Good active knee flexion.  Good active hip flexion and extension. No asymmetric swelling to the right lower extremity No deep calf tenderness is noted No pitting edema Extremity is warm, palpable dorsalis pedis pulses noted  Neurological: He is alert and oriented to person, place, and time.  Skin: Skin is warm. Capillary refill takes less than 2 seconds. No rash noted. No erythema.  Psychiatric: Thought content normal.     Assessment/Plan:  77 year old white male status post ORIF right pilon fracture and IM nailing right tibia fracture June 2018 with symptomatic hardware right  leg  -Symptomatic hardware right tibia  OR for removal of hardware  Outpatient procedure  Weight-bear as tolerated postoperatively, no range of motion restrictions postoperatively  Risks and benefits reviewed with the patient he wishes to proceed  - Pain management:  Multimodal analgesia postoperatively  - ID:   Perioperative antibiotics  - DVT/PE prophylaxis  Will have patient take aspirin 325 mg daily for 4 weeks   - Activity:  Weight-bear as tolerated postoperatively  - Dispo:  OR for removal of hardware  Outpatient procedure  Follow-up with orthopedics in 2 weeks   Jari Pigg, PA-C [facility] [phone] (C) [phone] (O) 01/03/2018, 9:09 PM

## 2018-01-04 ENCOUNTER — Ambulatory Visit (HOSPITAL_COMMUNITY): Payer: Medicare Other

## 2018-01-04 ENCOUNTER — Other Ambulatory Visit: Payer: Self-pay

## 2018-01-04 ENCOUNTER — Ambulatory Visit (HOSPITAL_COMMUNITY)
Admission: RE | Admit: 2018-01-04 | Discharge: 2018-01-04 | Disposition: A | Discharge status: Home / Self Care | Payer: Medicare Other | Source: Ambulatory Visit | Attending: Orthopedic Surgery | Admitting: Orthopedic Surgery

## 2018-01-04 ENCOUNTER — Ambulatory Visit (HOSPITAL_COMMUNITY): Payer: Medicare Other | Admitting: Critical Care Medicine

## 2018-01-04 ENCOUNTER — Encounter (HOSPITAL_COMMUNITY)
Admission: RE | Disposition: A | Discharge status: Home / Self Care | Payer: Self-pay | Source: Ambulatory Visit | Attending: Orthopedic Surgery

## 2018-01-04 ENCOUNTER — Encounter (HOSPITAL_COMMUNITY): Payer: Self-pay | Admitting: *Deleted

## 2018-01-04 DIAGNOSIS — Z9841 Cataract extraction status, right eye: Secondary | ICD-10-CM | POA: Diagnosis not present

## 2018-01-04 DIAGNOSIS — H579 Unspecified disorder of eye and adnexa: Secondary | ICD-10-CM | POA: Insufficient documentation

## 2018-01-04 DIAGNOSIS — Z8619 Personal history of other infectious and parasitic diseases: Secondary | ICD-10-CM | POA: Diagnosis not present

## 2018-01-04 DIAGNOSIS — Z79899 Other long term (current) drug therapy: Secondary | ICD-10-CM | POA: Insufficient documentation

## 2018-01-04 DIAGNOSIS — E559 Vitamin D deficiency, unspecified: Secondary | ICD-10-CM | POA: Insufficient documentation

## 2018-01-04 DIAGNOSIS — T8484XA Pain due to internal orthopedic prosthetic devices, implants and grafts, initial encounter: Secondary | ICD-10-CM | POA: Diagnosis not present

## 2018-01-04 DIAGNOSIS — Z888 Allergy status to other drugs, medicaments and biological substances status: Secondary | ICD-10-CM | POA: Diagnosis not present

## 2018-01-04 DIAGNOSIS — Z961 Presence of intraocular lens: Secondary | ICD-10-CM | POA: Insufficient documentation

## 2018-01-04 DIAGNOSIS — T85848A Pain due to other internal prosthetic devices, implants and grafts, initial encounter: Secondary | ICD-10-CM

## 2018-01-04 DIAGNOSIS — Z9842 Cataract extraction status, left eye: Secondary | ICD-10-CM | POA: Diagnosis not present

## 2018-01-04 DIAGNOSIS — Z87891 Personal history of nicotine dependence: Secondary | ICD-10-CM | POA: Diagnosis not present

## 2018-01-04 DIAGNOSIS — I10 Essential (primary) hypertension: Secondary | ICD-10-CM | POA: Diagnosis not present

## 2018-01-04 DIAGNOSIS — Y793 Surgical instruments, materials and orthopedic devices (including sutures) associated with adverse incidents: Secondary | ICD-10-CM | POA: Diagnosis not present

## 2018-01-04 DIAGNOSIS — M19072 Primary osteoarthritis, left ankle and foot: Secondary | ICD-10-CM | POA: Diagnosis not present

## 2018-01-04 DIAGNOSIS — Z7982 Long term (current) use of aspirin: Secondary | ICD-10-CM | POA: Diagnosis not present

## 2018-01-04 DIAGNOSIS — M19041 Primary osteoarthritis, right hand: Secondary | ICD-10-CM | POA: Diagnosis not present

## 2018-01-04 DIAGNOSIS — Z472 Encounter for removal of internal fixation device: Secondary | ICD-10-CM | POA: Diagnosis not present

## 2018-01-04 HISTORY — PX: HARDWARE REMOVAL: SHX979

## 2018-01-04 HISTORY — DX: Prediabetes: R73.03

## 2018-01-04 HISTORY — DX: Unspecified osteoarthritis, unspecified site: M19.90

## 2018-01-04 HISTORY — DX: Gastro-esophageal reflux disease without esophagitis: K21.9

## 2018-01-04 LAB — CBC WITH DIFFERENTIAL/PLATELET
Abs Immature Granulocytes: 0 10*3/uL (ref 0.0–0.1)
Basophils Absolute: 0 10*3/uL (ref 0.0–0.1)
Basophils Relative: 1 %
EOS ABS: 0.1 10*3/uL (ref 0.0–0.7)
Eosinophils Relative: 3 %
HCT: 45.1 % (ref 39.0–52.0)
Hemoglobin: 15 g/dL (ref 13.0–17.0)
Immature Granulocytes: 0 %
LYMPHS PCT: 39 %
Lymphs Abs: 2 10*3/uL (ref 0.7–4.0)
MCH: 30.5 pg (ref 26.0–34.0)
MCHC: 33.3 g/dL (ref 30.0–36.0)
MCV: 91.7 fL (ref 78.0–100.0)
MONO ABS: 0.6 10*3/uL (ref 0.1–1.0)
MONOS PCT: 11 %
Neutro Abs: 2.3 10*3/uL (ref 1.7–7.7)
Neutrophils Relative %: 46 %
Platelets: 132 10*3/uL — ABNORMAL LOW (ref 150–400)
RBC: 4.92 MIL/uL (ref 4.22–5.81)
RDW: 13.2 % (ref 11.5–15.5)
WBC: 5.1 10*3/uL (ref 4.0–10.5)

## 2018-01-04 LAB — COMPREHENSIVE METABOLIC PANEL
ALK PHOS: 48 U/L (ref 38–126)
ALT: 18 U/L (ref 0–44)
ANION GAP: 7 (ref 5–15)
AST: 19 U/L (ref 15–41)
Albumin: 3.8 g/dL (ref 3.5–5.0)
BILIRUBIN TOTAL: 1.2 mg/dL (ref 0.3–1.2)
BUN: 23 mg/dL (ref 8–23)
CALCIUM: 8.9 mg/dL (ref 8.9–10.3)
CO2: 25 mmol/L (ref 22–32)
Chloride: 109 mmol/L (ref 98–111)
Creatinine, Ser: 1.04 mg/dL (ref 0.61–1.24)
GFR calc Af Amer: >60 mL/min (ref >60)
GLUCOSE: 109 mg/dL — AB (ref 70–99)
Potassium: 4 mmol/L (ref 3.5–5.1)
Sodium: 141 mmol/L (ref 135–145)
TOTAL PROTEIN: 6.4 g/dL — AB (ref 6.5–8.1)

## 2018-01-04 LAB — GLUCOSE, CAPILLARY: Glucose-Capillary: 106 mg/dL — ABNORMAL HIGH (ref 70–99)

## 2018-01-04 LAB — PROTIME-INR
INR: 1.11
Prothrombin Time: 14.2 seconds (ref 11.4–15.2)

## 2018-01-04 LAB — APTT: aPTT: 28 seconds (ref 24–36)

## 2018-01-04 SURGERY — REMOVAL, HARDWARE
Anesthesia: General | Site: Leg Lower | Laterality: Right

## 2018-01-04 MED ORDER — KETOROLAC TROMETHAMINE 30 MG/ML IJ SOLN: 30.0000 mg | Freq: Once | INTRAMUSCULAR | Status: DC

## 2018-01-04 MED ORDER — LIDOCAINE HCL (CARDIAC) PF 100 MG/5ML IV SOSY
PREFILLED_SYRINGE | INTRAVENOUS | Status: DC | PRN
  Administered 2018-01-04: 80 mg via INTRAVENOUS

## 2018-01-04 MED ORDER — KETOROLAC TROMETHAMINE 10 MG PO TABS: 10.0000 mg | ORAL_TABLET | Freq: Four times a day (QID) | ORAL | 0 refills | Status: DC | PRN

## 2018-01-04 MED ORDER — MIDAZOLAM HCL 2 MG/2ML IJ SOLN
INTRAMUSCULAR | Status: AC
  Filled 2018-01-04: qty 2

## 2018-01-04 MED ORDER — LIDOCAINE 2% (20 MG/ML) 5 ML SYRINGE
INTRAMUSCULAR | Status: AC
  Filled 2018-01-04: qty 5

## 2018-01-04 MED ORDER — DEXAMETHASONE SODIUM PHOSPHATE 10 MG/ML IJ SOLN
INTRAMUSCULAR | Status: DC | PRN
  Administered 2018-01-04: 7.5 mg via INTRAVENOUS
  Administered 2018-01-04: 5 mg via INTRAVENOUS
  Administered 2018-01-04: 7.5 mg via INTRAVENOUS
  Administered 2018-01-04: 5 mg via INTRAVENOUS

## 2018-01-04 MED ORDER — LACTATED RINGERS IV SOLN
INTRAVENOUS | Status: DC
  Administered 2018-01-04 (×2): via INTRAVENOUS

## 2018-01-04 MED ORDER — FENTANYL CITRATE (PF) 250 MCG/5ML IJ SOLN
INTRAMUSCULAR | Status: AC
  Filled 2018-01-04: qty 5

## 2018-01-04 MED ORDER — PHENYLEPHRINE HCL 10 MG/ML IJ SOLN
INTRAMUSCULAR | Status: DC | PRN
  Administered 2018-01-04: 80 ug via INTRAVENOUS
  Administered 2018-01-04: 40 ug via INTRAVENOUS
  Administered 2018-01-04: 80 ug via INTRAVENOUS
  Administered 2018-01-04: 40 ug via INTRAVENOUS

## 2018-01-04 MED ORDER — FENTANYL CITRATE (PF) 100 MCG/2ML IJ SOLN
INTRAMUSCULAR | Status: AC
  Filled 2018-01-04: qty 2

## 2018-01-04 MED ORDER — CHLORHEXIDINE GLUCONATE 4 % EX LIQD: 60.0000 mL | Freq: Once | CUTANEOUS | Status: DC

## 2018-01-04 MED ORDER — FENTANYL CITRATE (PF) 100 MCG/2ML IJ SOLN
25.0000 ug | INTRAMUSCULAR | Status: DC | PRN
  Administered 2018-01-04: 50 ug via INTRAVENOUS
  Administered 2018-01-04 (×2): 25 ug via INTRAVENOUS

## 2018-01-04 MED ORDER — GABAPENTIN 300 MG PO CAPS
300.0000 mg | ORAL_CAPSULE | Freq: Once | ORAL | Status: AC
  Administered 2018-01-04: 300 mg via ORAL
  Filled 2018-01-04: qty 1

## 2018-01-04 MED ORDER — CEFAZOLIN SODIUM-DEXTROSE 2-4 GM/100ML-% IV SOLN
2.0000 g | INTRAVENOUS | Status: AC
  Administered 2018-01-04: 2 g via INTRAVENOUS
  Filled 2018-01-04: qty 100

## 2018-01-04 MED ORDER — MIDAZOLAM HCL 5 MG/5ML IJ SOLN
INTRAMUSCULAR | Status: DC | PRN
  Administered 2018-01-04: 2 mg via INTRAVENOUS

## 2018-01-04 MED ORDER — DEXAMETHASONE SODIUM PHOSPHATE 10 MG/ML IJ SOLN
INTRAMUSCULAR | Status: AC
  Filled 2018-01-04: qty 1

## 2018-01-04 MED ORDER — ONDANSETRON HCL 4 MG/2ML IJ SOLN
INTRAMUSCULAR | Status: AC
  Filled 2018-01-04: qty 2

## 2018-01-04 MED ORDER — ONDANSETRON HCL 4 MG PO TABS: 4.0000 mg | ORAL_TABLET | Freq: Three times a day (TID) | ORAL | 0 refills | Status: DC | PRN

## 2018-01-04 MED ORDER — OXYCODONE HCL 5 MG PO TABS
ORAL_TABLET | ORAL | Status: AC
  Filled 2018-01-04: qty 1

## 2018-01-04 MED ORDER — EPHEDRINE SULFATE 50 MG/ML IJ SOLN
INTRAMUSCULAR | Status: DC | PRN
  Administered 2018-01-04: 10 mg via INTRAVENOUS
  Administered 2018-01-04 (×2): 5 mg via INTRAVENOUS

## 2018-01-04 MED ORDER — HYDROCODONE-ACETAMINOPHEN 5-325 MG PO TABS: 1.0000 | ORAL_TABLET | Freq: Four times a day (QID) | ORAL | 0 refills | Status: DC | PRN

## 2018-01-04 MED ORDER — ROCURONIUM BROMIDE 50 MG/5ML IV SOSY
PREFILLED_SYRINGE | INTRAVENOUS | Status: DC | PRN
  Administered 2018-01-04: 50 mg via INTRAVENOUS

## 2018-01-04 MED ORDER — ONDANSETRON HCL 4 MG/2ML IJ SOLN
INTRAMUSCULAR | Status: DC | PRN
  Administered 2018-01-04: 4 mg via INTRAVENOUS

## 2018-01-04 MED ORDER — PROPOFOL 10 MG/ML IV BOLUS
INTRAVENOUS | Status: DC | PRN
  Administered 2018-01-04: 100 mg via INTRAVENOUS
  Administered 2018-01-04 (×2): 20 mg via INTRAVENOUS

## 2018-01-04 MED ORDER — PROPOFOL 10 MG/ML IV BOLUS
INTRAVENOUS | Status: AC
  Filled 2018-01-04: qty 20

## 2018-01-04 MED ORDER — OXYCODONE HCL 5 MG PO TABS
5.0000 mg | ORAL_TABLET | Freq: Once | ORAL | Status: AC | PRN
  Administered 2018-01-04: 5 mg via ORAL

## 2018-01-04 MED ORDER — KETOROLAC TROMETHAMINE 30 MG/ML IJ SOLN
INTRAMUSCULAR | Status: AC
  Administered 2018-01-04: 30 mg
  Filled 2018-01-04: qty 1

## 2018-01-04 MED ORDER — ROCURONIUM BROMIDE 50 MG/5ML IV SOSY
PREFILLED_SYRINGE | INTRAVENOUS | Status: AC
  Filled 2018-01-04: qty 5

## 2018-01-04 MED ORDER — OXYCODONE HCL 5 MG/5ML PO SOLN: 5.0000 mg | Freq: Once | ORAL | Status: AC | PRN

## 2018-01-04 MED ORDER — SUGAMMADEX SODIUM 200 MG/2ML IV SOLN
INTRAVENOUS | Status: DC | PRN
  Administered 2018-01-04: 200 mg via INTRAVENOUS

## 2018-01-04 MED ORDER — ACETAMINOPHEN 500 MG PO TABS
1000.0000 mg | ORAL_TABLET | Freq: Once | ORAL | Status: AC
  Administered 2018-01-04: 1000 mg via ORAL
  Filled 2018-01-04: qty 2

## 2018-01-04 MED ORDER — 0.9 % SODIUM CHLORIDE (POUR BTL) OPTIME
TOPICAL | Status: DC | PRN
  Administered 2018-01-04: 1000 mL

## 2018-01-04 MED ORDER — FENTANYL CITRATE (PF) 100 MCG/2ML IJ SOLN
INTRAMUSCULAR | Status: DC | PRN
  Administered 2018-01-04 (×3): 50 ug via INTRAVENOUS

## 2018-01-04 MED ORDER — EPHEDRINE 5 MG/ML INJ
INTRAVENOUS | Status: AC
  Filled 2018-01-04: qty 10

## 2018-01-04 SURGICAL SUPPLY — 49 items
BANDAGE ACE 4X5 VEL STRL LF (GAUZE/BANDAGES/DRESSINGS) ×2 IMPLANT
BANDAGE ELASTIC 4 VELCRO ST LF (GAUZE/BANDAGES/DRESSINGS) ×2 IMPLANT
BANDAGE ELASTIC 6 VELCRO ST LF (GAUZE/BANDAGES/DRESSINGS) ×2 IMPLANT
BANDAGE ESMARK 6X9 LF (GAUZE/BANDAGES/DRESSINGS) ×1 IMPLANT
BNDG ESMARK 6X9 LF (GAUZE/BANDAGES/DRESSINGS) ×2
BNDG GAUZE ELAST 4 BULKY (GAUZE/BANDAGES/DRESSINGS) ×2 IMPLANT
BRUSH SCRUB SURG 4.25 DISP (MISCELLANEOUS) ×4 IMPLANT
COVER SURGICAL LIGHT HANDLE (MISCELLANEOUS) ×4 IMPLANT
CUFF TOURNIQUET SINGLE 24IN (TOURNIQUET CUFF) IMPLANT
CUFF TOURNIQUET SINGLE 34IN LL (TOURNIQUET CUFF) ×2 IMPLANT
DRAPE C-ARM 42X72 X-RAY (DRAPES) ×2 IMPLANT
DRAPE C-ARMOR (DRAPES) ×2 IMPLANT
DRAPE U-SHAPE 47X51 STRL (DRAPES) ×2 IMPLANT
DRSG ADAPTIC 3X8 NADH LF (GAUZE/BANDAGES/DRESSINGS) ×2 IMPLANT
ELECT REM PT RETURN 9FT ADLT (ELECTROSURGICAL) ×2
ELECTRODE REM PT RTRN 9FT ADLT (ELECTROSURGICAL) ×1 IMPLANT
GAUZE SPONGE 4X4 12PLY STRL (GAUZE/BANDAGES/DRESSINGS) ×2 IMPLANT
GLOVE BIO SURGEON STRL SZ7.5 (GLOVE) ×2 IMPLANT
GLOVE BIO SURGEON STRL SZ8 (GLOVE) ×2 IMPLANT
GLOVE BIOGEL PI IND STRL 7.5 (GLOVE) ×1 IMPLANT
GLOVE BIOGEL PI IND STRL 8 (GLOVE) ×1 IMPLANT
GLOVE BIOGEL PI INDICATOR 7.5 (GLOVE) ×1
GLOVE BIOGEL PI INDICATOR 8 (GLOVE) ×1
GOWN STRL REUS W/ TWL LRG LVL3 (GOWN DISPOSABLE) ×2 IMPLANT
GOWN STRL REUS W/ TWL XL LVL3 (GOWN DISPOSABLE) ×1 IMPLANT
GOWN STRL REUS W/TWL LRG LVL3 (GOWN DISPOSABLE) ×2
GOWN STRL REUS W/TWL XL LVL3 (GOWN DISPOSABLE) ×1
KIT BASIN OR (CUSTOM PROCEDURE TRAY) ×2 IMPLANT
KIT TURNOVER KIT B (KITS) ×2 IMPLANT
MANIFOLD NEPTUNE II (INSTRUMENTS) ×2 IMPLANT
NS IRRIG 1000ML POUR BTL (IV SOLUTION) ×2 IMPLANT
PACK ORTHO EXTREMITY (CUSTOM PROCEDURE TRAY) ×2 IMPLANT
PAD ABD 8X10 STRL (GAUZE/BANDAGES/DRESSINGS) ×2 IMPLANT
PAD ARMBOARD 7.5X6 YLW CONV (MISCELLANEOUS) ×4 IMPLANT
PADDING CAST COTTON 6X4 STRL (CAST SUPPLIES) ×2 IMPLANT
SPONGE LAP 18X18 X RAY DECT (DISPOSABLE) ×2 IMPLANT
SUCTION FRAZIER HANDLE 10FR (MISCELLANEOUS) ×1
SUCTION TUBE FRAZIER 10FR DISP (MISCELLANEOUS) ×1 IMPLANT
SUT ETHILON 2 0 FS 18 (SUTURE) ×4 IMPLANT
SUT VIC AB 0 CT1 27 (SUTURE) ×1
SUT VIC AB 0 CT1 27XBRD ANBCTR (SUTURE) ×1 IMPLANT
SUT VIC AB 2-0 CT1 27 (SUTURE) ×1
SUT VIC AB 2-0 CT1 TAPERPNT 27 (SUTURE) ×1 IMPLANT
TOWEL OR 17X24 6PK STRL BLUE (TOWEL DISPOSABLE) ×4 IMPLANT
TOWEL OR 17X26 10 PK STRL BLUE (TOWEL DISPOSABLE) ×4 IMPLANT
TUBE CONNECTING 12X1/4 (SUCTIONS) ×2 IMPLANT
UNDERPAD 30X30 (UNDERPADS AND DIAPERS) ×2 IMPLANT
WATER STERILE IRR 1000ML POUR (IV SOLUTION) ×4 IMPLANT
YANKAUER SUCT BULB TIP NO VENT (SUCTIONS) ×2 IMPLANT

## 2018-01-04 NOTE — Transfer of Care (Signed)
Immediate Anesthesia Transfer of Care Note  Patient: Patrick Good  Procedure(s) Performed: REMOVAL OF RIGHT TIBIAL HARDWARE (Right Leg Lower)  Patient Location: PACU  Anesthesia Type:General  Level of Consciousness: drowsy and patient cooperative  Airway & Oxygen Therapy: Patient Spontanous Breathing  Post-op Assessment: Report given to RN and Post -op Vital signs reviewed and stable  Post vital signs: Reviewed and stable  Last Vitals:  Vitals Value Taken Time  BP 155/83 01/04/2018 10:13 AM  Temp    Pulse 75 01/04/2018 10:15 AM  Resp 14 01/04/2018 10:15 AM  SpO2 100 % 01/04/2018 10:15 AM  Vitals shown include unvalidated device data.  Last Pain:  Vitals:   01/04/18 0637  TempSrc:   PainSc: 2       Patients Stated Pain Goal: 7 (32/95/18 8416)  Complications: No apparent anesthesia complications

## 2018-01-04 NOTE — Anesthesia Procedure Notes (Signed)
Procedure Name: Intubation Date/Time: 01/04/2018 8:28 AM Performed by: Gwyndolyn Saxon, CRNA Pre-anesthesia Checklist: Patient identified, Emergency Drugs available, Suction available and Patient being monitored Patient Re-evaluated:Patient Re-evaluated prior to induction Oxygen Delivery Method: Circle system utilized Preoxygenation: Pre-oxygenation with 100% oxygen Induction Type: IV induction Ventilation: Mask ventilation without difficulty Laryngoscope Size: Mac and 3 Grade View: Grade I Tube type: Oral Tube size: 7.5 mm Number of attempts: 1 Airway Equipment and Method: Patient positioned with wedge pillow Placement Confirmation: ETT inserted through vocal cords under direct vision,  positive ETCO2,  CO2 detector and breath sounds checked- equal and bilateral Secured at: 24 cm Tube secured with: Tape Dental Injury: Teeth and Oropharynx as per pre-operative assessment

## 2018-01-04 NOTE — Anesthesia Preprocedure Evaluation (Addendum)
Anesthesia Evaluation  Patient identified by MRN, date of birth, ID band Patient awake    Reviewed: Allergy & Precautions, NPO status , Patient's Chart, lab work & pertinent test results  History of Anesthesia Complications Negative for: history of anesthetic complications  Airway Mallampati: II  TM Distance: >3 FB Neck ROM: Full    Dental  (+) Teeth Intact,    Pulmonary former smoker,    breath sounds clear to auscultation       Cardiovascular hypertension,  Rhythm:Regular     Neuro/Psych negative neurological ROS  negative psych ROS   GI/Hepatic Neg liver ROS, GERD  ,  Endo/Other  negative endocrine ROS  Renal/GU negative Renal ROS     Musculoskeletal  (+) Arthritis ,   Abdominal   Peds  Hematology negative hematology ROS (+)   Anesthesia Other Findings Left ventricle: The cavity size was normal. Wall thickness was   increased in a pattern of mild LVH. Systolic function was normal.   The estimated ejection fraction was in the range of 55% to 60%.   Wall motion was normal; there were no regional wall motion   abnormalities. Doppler parameters are consistent with abnormal   left ventricular relaxation (grade 1 diastolic dysfunction). - Aortic valve: There was no stenosis. - Mitral valve: There was no significant regurgitation. - Right ventricle: The cavity size was normal. Systolic function   was normal. - Pulmonary arteries: No complete TR doppler jet so unable to   estimate PA systolic pressure. - Inferior vena cava: The vessel was normal in size. The   respirophasic diameter changes were in the normal range (>= 50%),   consistent with normal central venous pressure.   Reproductive/Obstetrics                            Anesthesia Physical Anesthesia Plan  ASA: II  Anesthesia Plan: General   Post-op Pain Management:    Induction: Intravenous  PONV Risk Score and Plan: 2  and Ondansetron and Dexamethasone  Airway Management Planned: LMA and Oral ETT  Additional Equipment: None  Intra-op Plan:   Post-operative Plan: Extubation in OR  Informed Consent: I have reviewed the patients History and Physical, chart, labs and discussed the procedure including the risks, benefits and alternatives for the proposed anesthesia with the patient or authorized representative who has indicated his/her understanding and acceptance.   Dental advisory given  Plan Discussed with: CRNA and Surgeon  Anesthesia Plan Comments:         Anesthesia Quick Evaluation

## 2018-01-04 NOTE — Anesthesia Postprocedure Evaluation (Signed)
Anesthesia Post Note  Patient: Patrick Good  Procedure(s) Performed: REMOVAL OF RIGHT TIBIAL HARDWARE (Right Leg Lower)     Patient location during evaluation: PACU Anesthesia Type: General Level of consciousness: awake Pain management: pain level controlled Vital Signs Assessment: post-procedure vital signs reviewed and stable Respiratory status: spontaneous breathing Cardiovascular status: stable Postop Assessment: no apparent nausea or vomiting Anesthetic complications: no    Last Vitals:  Vitals:   01/04/18 1115 01/04/18 1126  BP: (!) 141/75 138/80  Pulse: 70 67  Resp: 16 16  Temp:  (!) 36.1 C  SpO2: 98% 99%    Last Pain:  Vitals:   01/04/18 1126  TempSrc:   PainSc: 5         RLE Motor Response: Purposeful movement;Responds to commands (01/04/18 1126) RLE Sensation: Full sensation (01/04/18 1126)      Marguerette Sheller

## 2018-01-04 NOTE — Op Note (Addendum)
01/04/2018  10:13 AM  PATIENT:  Patrick Good  77 y.o. male  PRE-OPERATIVE DIAGNOSIS:  symtomatic hardware, right tibia  POST-OPERATIVE DIAGNOSIS:  symtomatic hardware, right tibia  PROCEDURE:  Procedure(s): REMOVAL OF RIGHT TIBIAL HARDWARE (Right)  SURGEON:  Surgeon(s) and Role:    Altamese Nemaha, MD - Primary  ASSISTANTS: none   ANESTHESIA:   general  EBL:  75 mL   BLOOD ADMINISTERED:none  DRAINS: none   LOCAL MEDICATIONS USED:  NONE  SPECIMEN:  No Specimen  DISPOSITION OF SPECIMEN:  N/A  COUNTS:  YES  TOURNIQUET:  * No tourniquets in log *  DICTATION: .Note written in EPIC  PLAN OF CARE: Discharge to home after PACU  PATIENT DISPOSITION:  PACU - hemodynamically stable.   Delay start of Pharmacological VTE agent (>24hrs) due to surgical blood loss or risk of bleeding: not applicable  BRIEF SUMMARY OF INDICATION FOR PROCEDURE:  Patrick Good is a very pleasant 77 y.o. who sustained right tibia fracture.  The patient went on to unite and now presents for elective removal of the hardware because of continued tenderness about the implants that did not resolve with observation or conservative measures. The patient and I discussed the risks and benefits of surgery including the possibility of failure to alleviate symptoms, need for further surgery, DVT, PE, heart attack, stroke, anesthetic complications, infection, bleeding and others. The patient wished to proceed and provided consent.  BRIEF SUMMARY OF PROCEDURE:  After administration of preoperative antibiotics, the patient was taken to the operating room where general anesthesia was induced.  The right lower extremity was prepped and draped in usual sterile fashion.  Time-out was held.   Attention was turned to the leg.  Here, I remade the old incisions used for screw and nail insertion. I cleared the head of each screw with a small clamp tip, then used the screwdriver to withdraw each locking bolt. At the  anterior aspect of the knee, I remade the medial parapatellar retinacular incision.  A curette was initially advanced into the center of the nail and then the extraction bolt inserted, engaging it while placing a curette in the distal locking bolt site to prevent rotation. The nail was extracted without difficulty, being careful to avoid injury to the surrounding bone and soft tissues. As there was an anterior to posterior screw distally, a formal approach was required to this area to avoid nerve or vessel injury. My assistant retracted the bundle laterally and the tendon medially allowing for direct visualization and removal of the screw. A small fragment free screw was also removed through a more lateral incision, spreading directly down to the screw head and removing it without difficulty.  The wounds were irrigated thoroughly, closed in standard layered fashion with 0 Vicryl for the retinaculum, 2-0 Vicryl and 2-0 nylon for the skin.  Sterile gently compressive dressing was applied and then Ace wrap from foot to thigh.  The patient was awakened from anesthesia and transported to PACU in stable condition.  PROGNOSIS:  DICKEY CAAMANO will be weightbearing as tolerated.  He may shower in 2 days. Oozing from the bone is anticipated.  We will plan to see him back for removal of his sutures in 10 days, and formal DVT prophylaxis has not been recommended.     Astrid Divine. Marcelino Scot, M.D.

## 2018-01-04 NOTE — Discharge Instructions (Addendum)
Orthopaedic Trauma Service Discharge Instructions   General Discharge Instructions  WEIGHT BEARING STATUS: Weightbear as tolerated Right leg   RANGE OF MOTION/ACTIVITY: unrestricted range of motion R knee and ankle   Wound Care: daily wound care starting on 01/06/2018. See below   Discharge Wound Care Instructions  Do NOT apply any ointments, solutions or lotions to pin sites or surgical wounds.  These prevent needed drainage and even though solutions like hydrogen peroxide kill bacteria, they also damage cells lining the pin sites that help fight infection.  Applying lotions or ointments can keep the wounds moist and can cause them to breakdown and open up as well. This can increase the risk for infection. When in doubt call the office.  Surgical incisions should be dressed daily.  If any drainage is noted, use one layer of adaptic, then gauze, Kerlix, and an ace wrap.  Once the incision is completely dry and without drainage, it may be left open to air out.  Showering may begin 36-48 hours later.  Cleaning gently with soap and water.  Traumatic wounds should be dressed daily as well.    One layer of adaptic, gauze, Kerlix, then ace wrap.  The adaptic can be discontinued once the draining has ceased    If you have a wet to dry dressing: wet the gauze with saline the squeeze as much saline out so the gauze is moist (not soaking wet), place moistened gauze over wound, then place a dry gauze over the moist one, followed by Kerlix wrap, then ace wrap.     DVT/PE prophylaxis: resume aspirin   Diet: as you were eating previously.  Can use over the counter stool softeners and bowel preparations, such as Miralax, to help with bowel movements.  Narcotics can be constipating.  Be sure to drink plenty of fluids  PAIN MEDICATION USE AND EXPECTATIONS  You have likely been given narcotic medications to help control your pain.  After a traumatic event that results in an fracture (broken bone) with  or without surgery, it is ok to use narcotic pain medications to help control one's pain.  We understand that everyone responds to pain differently and each individual patient will be evaluated on a regular basis for the continued need for narcotic medications. Ideally, narcotic medication use should last no more than 6-8 weeks (coinciding with fracture healing).   As a patient it is your responsibility as well to monitor narcotic medication use and report the amount and frequency you use these medications when you come to your office visit.   We would also advise that if you are using narcotic medications, you should take a dose prior to therapy to maximize you participation.  IF YOU ARE ON NARCOTIC MEDICATIONS IT IS NOT PERMISSIBLE TO OPERATE A MOTOR VEHICLE (MOTORCYCLE/CAR/TRUCK/MOPED) OR HEAVY MACHINERY DO NOT MIX NARCOTICS WITH OTHER CNS (CENTRAL NERVOUS SYSTEM) DEPRESSANTS SUCH AS ALCOHOL   STOP SMOKING OR USING NICOTINE PRODUCTS!!!!  As discussed nicotine severely impairs your body's ability to heal surgical and traumatic wounds but also impairs bone healing.  Wounds and bone heal by forming microscopic blood vessels (angiogenesis) and nicotine is a vasoconstrictor (essentially, shrinks blood vessels).  Therefore, if vasoconstriction occurs to these microscopic blood vessels they essentially disappear and are unable to deliver necessary nutrients to the healing tissue.  This is one modifiable factor that you can do to dramatically increase your chances of healing your injury.    (This means no smoking, no nicotine gum, patches, etc)  DO NOT USE NONSTEROIDAL ANTI-INFLAMMATORY DRUGS (NSAID'S)  Using products such as Advil (ibuprofen), Aleve (naproxen), Motrin (ibuprofen) for additional pain control during fracture healing can delay and/or prevent the healing response.  If you would like to take over the counter (OTC) medication, Tylenol (acetaminophen) is ok.  However, some narcotic medications  that are given for pain control contain acetaminophen as well. Therefore, you should not exceed more than 4000 mg of tylenol in a day if you do not have liver disease.  Also note that there are may OTC medicines, such as cold medicines and allergy medicines that my contain tylenol as well.  If you have any questions about medications and/or interactions please ask your doctor/PA or your pharmacist.      ICE AND ELEVATE INJURED/OPERATIVE EXTREMITY  Using ice and elevating the injured extremity above your heart can help with swelling and pain control.  Icing in a pulsatile fashion, such as 20 minutes on and 20 minutes off, can be followed.    Do not place ice directly on skin. Make sure there is a barrier between to skin and the ice pack.    Using frozen items such as frozen peas works well as the conform nicely to the are that needs to be iced.  USE AN ACE WRAP OR TED HOSE FOR SWELLING CONTROL  In addition to icing and elevation, Ace wraps or TED hose are used to help limit and resolve swelling.  It is recommended to use Ace wraps or TED hose until you are informed to stop.    When using Ace Wraps start the wrapping distally (farthest away from the body) and wrap proximally (closer to the body)   Example: If you had surgery on your leg or thing and you do not have a splint on, start the ace wrap at the toes and work your way up to the thigh        If you had surgery on your upper extremity and do not have a splint on, start the ace wrap at your fingers and work your way up to the upper arm  IF YOU ARE IN A SPLINT OR CAST DO NOT [place]   If your splint gets wet for any reason please contact the office immediately. You may shower in your splint or cast as long as you keep it dry.  This can be done by wrapping in a cast cover or garbage back (or similar)  Do Not stick any thing down your splint or cast such as pencils, money, or hangers to try and scratch yourself with.  If you feel  itchy take benadryl as prescribed on the bottle for itching  IF YOU ARE IN A CAM BOOT (BLACK BOOT)  You may remove boot periodically. Perform daily dressing changes as noted below.  Wash the liner of the boot regularly and wear a sock when wearing the boot. It is recommended that you sleep in the boot until told otherwise  CALL THE OFFICE WITH ANY QUESTIONS OR CONCERNS: [phone]    Post Anesthesia Home Care Instructions  Activity: Get plenty of rest for the remainder of the day. A responsible individual must stay with you for 24 hours following the procedure.  For the next 24 hours, DO NOT: -Drive a car -Paediatric nurse -Drink alcoholic beverages -Take any medication unless instructed by your physician -Make any legal decisions or sign important papers.  Meals: Start with liquid foods such as gelatin or soup. Progress to  regular foods as tolerated. Avoid greasy, spicy, heavy foods. If nausea and/or vomiting occur, drink only clear liquids until the nausea and/or vomiting subsides. Call your physician if vomiting continues.  Special Instructions/Symptoms: Your throat may feel dry or sore from the anesthesia or the breathing tube placed in your throat during surgery. If this causes discomfort, gargle with warm salt water. The discomfort should disappear within 24 hours.  If you had a scopolamine patch placed behind your ear for the management of post- operative nausea and/or vomiting:  1. The medication in the patch is effective for 72 hours, after which it should be removed.  Wrap patch in a tissue and discard in the trash. Wash hands thoroughly with soap and water. 2. You may remove the patch earlier than 72 hours if you experience unpleasant side effects which may include dry mouth, dizziness or visual disturbances. 3. Avoid touching the patch. Wash your hands with soap and water after contact with the patch.

## 2018-01-05 ENCOUNTER — Encounter (HOSPITAL_COMMUNITY): Payer: Self-pay | Admitting: Orthopedic Surgery

## 2018-02-09 DIAGNOSIS — Z23 Encounter for immunization: Secondary | ICD-10-CM | POA: Diagnosis not present

## 2018-03-07 DIAGNOSIS — H04123 Dry eye syndrome of bilateral lacrimal glands: Secondary | ICD-10-CM | POA: Diagnosis not present

## 2018-03-23 ENCOUNTER — Telehealth: Payer: Self-pay | Admitting: Family Medicine

## 2018-03-23 NOTE — Telephone Encounter (Signed)
Documented in chart already( 02/23/18). KW

## 2018-03-23 NOTE — Telephone Encounter (Signed)
Pt called to let office know he received his flu shot in Oct and to stop calling him to remind him.  Thanks, [employer]

## 2018-05-20 ENCOUNTER — Encounter: Payer: Medicare Other | Admitting: Family Medicine

## 2018-05-23 ENCOUNTER — Ambulatory Visit (INDEPENDENT_AMBULATORY_CARE_PROVIDER_SITE_OTHER): Payer: Medicare Other | Admitting: Family Medicine

## 2018-05-23 ENCOUNTER — Encounter: Payer: Self-pay | Admitting: Family Medicine

## 2018-05-23 ENCOUNTER — Other Ambulatory Visit: Payer: Self-pay

## 2018-05-23 VITALS — BP 145/85 | HR 81 | Ht 75.5 in | Wt 204.8 lb

## 2018-05-23 DIAGNOSIS — Z131 Encounter for screening for diabetes mellitus: Secondary | ICD-10-CM

## 2018-05-23 DIAGNOSIS — Z125 Encounter for screening for malignant neoplasm of prostate: Secondary | ICD-10-CM | POA: Diagnosis not present

## 2018-05-23 DIAGNOSIS — E785 Hyperlipidemia, unspecified: Secondary | ICD-10-CM

## 2018-05-23 DIAGNOSIS — R030 Elevated blood-pressure reading, without diagnosis of hypertension: Secondary | ICD-10-CM | POA: Diagnosis not present

## 2018-05-23 DIAGNOSIS — Z0000 Encounter for general adult medical examination without abnormal findings: Secondary | ICD-10-CM | POA: Diagnosis not present

## 2018-05-23 NOTE — Patient Instructions (Signed)
We will call you with the lab results. Continue to record home blood pressures. Come in for a couple of medical assistant blood pressures over the next week or two.

## 2018-05-23 NOTE — Progress Notes (Signed)
  Subjective:     Patient ID: Patrick Good, male   DOB: 06-11-1940, 78 y.o.   MRN: [id number] Chief Complaint  Patient presents with  . Annual Exam   HPI States he checks his bp at home and has been normotensive. Recovering from a cold with clear PND. Has had cataract extraction and removal of hardware from his right lower leg since his last physical.  Review of Systems General: Feeling well HEENT: regular dental visits and eye exams. Cardiovascular: no chest pain, shortness of breath, or palpitations GI: no heartburn, no change in bowel habits or blood in the stool GU: nocturia x 1, no change in bladder habits  Neuro: no change in memory Psychiatric: not depressed Musculoskeletal: no joint pain    Objective:   Physical Exam Constitutional:      General: He is not in acute distress.    Appearance: Normal appearance.  Neurological:     Mental Status: He is alert.   Eyes: PERRLA, EOMI Neck: no thyromegaly, tenderness or nodules, no cervical adenopathy, no carotid bruits. ENT: TM's intact without inflammation;mild tonsillar enlargement without erythema or exudate, Lungs: Clear Heart : RRR without murmur or gallop Abd: bowel sounds present, soft, non-tender, no organomegaly Rectal: Prostate firm and non-tender Extremities: no edema Skin: no atypical lesions noted on back     Assessment:    1. Medicare annual wellness visit, subsequent  2. Screening for prostate cancer - PSA  3. Screening for diabetes mellitus - Comprehensive metabolic panel  4. Elevated blood-pressure reading without diagnosis of hypertension - Comprehensive metabolic panel  5. Hyperlipidemia, unspecified hyperlipidemia type - Lipid panel    Plan:    Discussed home and nursing bp checks. Further f/u pending lab results.

## 2018-05-24 LAB — COMPREHENSIVE METABOLIC PANEL
A/G RATIO: 1.7 (ref 1.2–2.2)
ALBUMIN: 4.4 g/dL (ref 3.7–4.7)
ALT: 23 IU/L (ref 0–44)
AST: 18 IU/L (ref 0–40)
Alkaline Phosphatase: 64 IU/L (ref 39–117)
BILIRUBIN TOTAL: 0.6 mg/dL (ref 0.0–1.2)
BUN / CREAT RATIO: 19 (ref 10–24)
BUN: 22 mg/dL (ref 8–27)
CHLORIDE: 104 mmol/L (ref 96–106)
CO2: 25 mmol/L (ref 20–29)
Calcium: 9.6 mg/dL (ref 8.6–10.2)
Creatinine, Ser: 1.14 mg/dL (ref 0.76–1.27)
GFR calc Af Amer: 72 mL/min/{1.73_m2} (ref >59)
GFR calc non Af Amer: 63 mL/min/{1.73_m2} (ref >59)
Globulin, Total: 2.6 g/dL (ref 1.5–4.5)
Glucose: 110 mg/dL — ABNORMAL HIGH (ref 65–99)
Potassium: 4.6 mmol/L (ref 3.5–5.2)
Sodium: 144 mmol/L (ref 134–144)
TOTAL PROTEIN: 7 g/dL (ref 6.0–8.5)

## 2018-05-24 LAB — LIPID PANEL
CHOLESTEROL TOTAL: 184 mg/dL (ref 100–199)
Chol/HDL Ratio: 4.3 ratio (ref 0.0–5.0)
HDL: 43 mg/dL (ref >39)
LDL Calculated: 125 mg/dL — ABNORMAL HIGH (ref 0–99)
TRIGLYCERIDES: 78 mg/dL (ref 0–149)
VLDL Cholesterol Cal: 16 mg/dL (ref 5–40)

## 2018-05-24 LAB — PSA: Prostate Specific Ag, Serum: 1.8 ng/mL (ref 0.0–4.0)

## 2018-06-22 DIAGNOSIS — H401131 Primary open-angle glaucoma, bilateral, mild stage: Secondary | ICD-10-CM | POA: Diagnosis not present

## 2018-07-08 ENCOUNTER — Telehealth: Payer: Self-pay | Admitting: Family Medicine

## 2018-07-08 DIAGNOSIS — H40003 Preglaucoma, unspecified, bilateral: Secondary | ICD-10-CM | POA: Diagnosis not present

## 2018-07-08 NOTE — Telephone Encounter (Signed)
Patient's wife wants to know If Dr. Rosanna Randy will take him as a patient.  He use to see Dr. Caryn Section per wife and then started seeing Mikki Santee all the time.

## 2018-07-11 NOTE — Telephone Encounter (Signed)
no

## 2018-11-29 NOTE — Progress Notes (Signed)
Patient: Patrick Good Male    DOB: 10-21-1940   78 y.o.   MRN: [id number] Visit Date: 11/30/2018  Today's Provider: Mar Daring, PA-C   Chief Complaint  Patient presents with  . Hypertension   Subjective:     HPI Patient here for his blood pressure follow-up. Patient was advised to check BP at home 165/85-131/80. Reports that his stress level is really high. Patient reports that he used to take Lisinopril but while he was on it he was having fainting spells. He has been off Lisinopril 10/06/2016.    Allergies  Allergen Reactions  . Tape Rash and Other (See Comments)    Plastic      Current Outpatient Medications:  .  aspirin EC 81 MG tablet, Take 81 mg by mouth daily., Disp: , Rfl:  .  Cholecalciferol (VITAMIN D3 PO), Take 1 capsule by mouth daily., Disp: , Rfl:  .  latanoprost (XALATAN) 0.005 % ophthalmic solution, Place 1 drop into both eyes at bedtime. , Disp: , Rfl:  .  Multiple Vitamins-Minerals (PRESERVISION AREDS) TABS, Take by mouth., Disp: , Rfl:  .  Polyvinyl Alcohol-Povidone PF (REFRESH) 1.4-0.6 % SOLN, Place 1 drop into both eyes 4 (four) times daily as needed (for irritation). , Disp: , Rfl:   Review of Systems  Constitutional: Negative.   Respiratory: Negative.   Cardiovascular: Negative.   Neurological: Negative.   Psychiatric/Behavioral: Negative.     Social History   Tobacco Use  . Smoking status: Former Smoker    Packs/day: 0.50    Years: 25.00    Pack years: 12.50    Types: Cigarettes    Quit date: 05/04/1992    Years since quitting: 26.5  . Smokeless tobacco: Never Used  Substance Use Topics  . Alcohol use: No    Alcohol/week: 0.0 standard drinks      Objective:   BP 121/85 (BP Location: Right Arm, Patient Position: Sitting, Cuff Size: Large)   Pulse 72   Temp 98.2 F (36.8 C) (Oral)   Resp 16   Wt 201 lb (91.2 kg)   BMI 24.79 kg/m  Vitals:   11/30/18 1036  BP: 121/85  Pulse: 72  Resp: 16  Temp: 98.2 F  (36.8 C)  TempSrc: Oral  Weight: 201 lb (91.2 kg)     Physical Exam Vitals signs reviewed.  Constitutional:      General: He is not in acute distress.    Appearance: Normal appearance. He is well-developed and normal weight. He is not ill-appearing or diaphoretic.  HENT:     Head: Normocephalic and atraumatic.  Neck:     Musculoskeletal: Normal range of motion and neck supple.     Thyroid: No thyromegaly.     Vascular: No JVD.     Trachea: No tracheal deviation.  Cardiovascular:     Rate and Rhythm: Normal rate and regular rhythm.     Pulses: Normal pulses.     Heart sounds: Normal heart sounds. No murmur. No friction rub. No gallop.   Pulmonary:     Effort: Pulmonary effort is normal. No respiratory distress.     Breath sounds: Normal breath sounds. No wheezing or rales.  Musculoskeletal:     Right lower leg: No edema.     Left lower leg: No edema.  Lymphadenopathy:     Cervical: No cervical adenopathy.  Neurological:     Mental Status: He is alert.      No results found  for any visits on 11/30/18.     Assessment & Plan    1. Essential hypertension BP elevated again and patient having increased stress at home. Will restart lisinopril but start at the 5mg  to see how he responds. I will see him back in 4-6 weeks to recheck BP. - lisinopril (ZESTRIL) 5 MG tablet; Take 1 tablet (5 mg total) by mouth daily.  Dispense: 90 tablet; Refill: 0     Mar Daring, PA-C  [facility]

## 2018-11-30 ENCOUNTER — Ambulatory Visit (INDEPENDENT_AMBULATORY_CARE_PROVIDER_SITE_OTHER): Payer: Medicare Other | Admitting: Physician Assistant

## 2018-11-30 ENCOUNTER — Other Ambulatory Visit: Payer: Self-pay

## 2018-11-30 ENCOUNTER — Encounter: Payer: Self-pay | Admitting: Physician Assistant

## 2018-11-30 VITALS — BP 121/85 | HR 72 | Temp 98.2°F | Resp 16 | Wt 201.0 lb

## 2018-11-30 DIAGNOSIS — I10 Essential (primary) hypertension: Secondary | ICD-10-CM | POA: Diagnosis not present

## 2018-11-30 MED ORDER — LISINOPRIL 5 MG PO TABS: 5.0000 mg | ORAL_TABLET | Freq: Every day | ORAL | 0 refills | Status: DC

## 2018-12-07 ENCOUNTER — Encounter: Payer: Self-pay | Admitting: Physician Assistant

## 2018-12-09 ENCOUNTER — Other Ambulatory Visit: Payer: Self-pay

## 2018-12-09 DIAGNOSIS — I10 Essential (primary) hypertension: Secondary | ICD-10-CM

## 2018-12-09 MED ORDER — LISINOPRIL 5 MG PO TABS: 5.0000 mg | ORAL_TABLET | Freq: Every day | ORAL | 0 refills | Status: DC

## 2018-12-09 NOTE — Telephone Encounter (Signed)
Patient's wife called and stated that patient is needing his Lisinopril 5 MG tablets send into pharmacy due to his previous order is just sitting at the postal service in [place] from what the lady with [facility] stated to her. Please advise.

## 2019-01-06 DIAGNOSIS — H40003 Preglaucoma, unspecified, bilateral: Secondary | ICD-10-CM | POA: Diagnosis not present

## 2019-01-10 NOTE — Progress Notes (Signed)
Patient: Patrick Good Male    DOB: 1940/10/21   78 y.o.   MRN: [id number] Visit Date: 01/11/2019  Today's Provider: Mar Daring, PA-C   Chief Complaint  Patient presents with  . Follow-up     BP   Subjective:    I,Joseline E. Rosas,RMA am acting as a Education administrator for [employer], PA-C.  HPI    Hypertension, follow-up:  BP Readings from Last 3 Encounters:  01/11/19 117/78  11/30/18 121/85  05/23/18 (!) 145/85    He was last seen for hypertension 1 months ago.  BP at that visit was 121/85.  Management since that visit includes; restarted lisinopril, but will start at the 5mg  to see how he responds. I will see him back in 4-6 weeks to recheck BP. He reports excellent compliance with treatment. He is not having side effects.  Outside blood pressures are 132/78 at night 106/68 lowest in the AM. He is experiencing some mild dizziness in the afternoon. He is having a lot of stress because his son's fiance passed away unexpectedly last month. Patient denies chest pain/chest pressure, fatigue, near syncope. ------------------------------------------------------------------------ Patient declined Influenza vaccine here. Is going to get it at [facility].   Allergies  Allergen Reactions  . Tape Rash and Other (See Comments)    Plastic      Current Outpatient Medications:  .  aspirin EC 81 MG tablet, Take 81 mg by mouth daily., Disp: , Rfl:  .  Cholecalciferol (VITAMIN D3 PO), Take 1 capsule by mouth daily., Disp: , Rfl:  .  latanoprost (XALATAN) 0.005 % ophthalmic solution, Place 1 drop into both eyes at bedtime. , Disp: , Rfl:  .  lisinopril (ZESTRIL) 5 MG tablet, Take 1 tablet (5 mg total) by mouth daily., Disp: 30 tablet, Rfl: 0 .  Multiple Vitamins-Minerals (PRESERVISION AREDS) TABS, Take by mouth., Disp: , Rfl:  .  Polyvinyl Alcohol-Povidone PF (REFRESH) 1.4-0.6 % SOLN, Place 1 drop into both eyes 4 (four) times daily as needed (for irritation). ,  Disp: , Rfl:   Review of Systems  Constitutional: Negative for appetite change, chills and fever.  Respiratory: Negative for chest tightness, shortness of breath and wheezing.   Cardiovascular: Negative for chest pain and palpitations.  Gastrointestinal: Negative for abdominal pain, nausea and vomiting.  Neurological: Positive for light-headedness (only 3 days since last seen). Negative for weakness, numbness and headaches.  Psychiatric/Behavioral: Negative.     Social History   Tobacco Use  . Smoking status: Former Smoker    Packs/day: 0.50    Years: 25.00    Pack years: 12.50    Types: Cigarettes    Quit date: 05/04/1992    Years since quitting: 26.7  . Smokeless tobacco: Never Used  Substance Use Topics  . Alcohol use: No    Alcohol/week: 0.0 standard drinks      Objective:   BP 117/78 (BP Location: Right Arm, Patient Position: Sitting, Cuff Size: Large)   Pulse 76   Temp 97.9 F (36.6 C) (Other (Comment)) Comment (Src): forehead  Resp 16   Wt 197 lb 12.8 oz (89.7 kg)   BMI 24.40 kg/m  Vitals:   01/11/19 1045  BP: 117/78  Pulse: 76  Resp: 16  Temp: 97.9 F (36.6 C)  TempSrc: Other (Comment)  Weight: 197 lb 12.8 oz (89.7 kg)  Body mass index is 24.4 kg/m.   Physical Exam Vitals signs reviewed.  Constitutional:      General:  He is not in acute distress.    Appearance: Normal appearance. He is well-developed. He is not ill-appearing or diaphoretic.  HENT:     Head: Normocephalic and atraumatic.  Neck:     Musculoskeletal: Normal range of motion and neck supple.  Cardiovascular:     Rate and Rhythm: Normal rate and regular rhythm.     Heart sounds: Normal heart sounds. No murmur. No friction rub. No gallop.   Pulmonary:     Effort: Pulmonary effort is normal. No respiratory distress.     Breath sounds: Normal breath sounds. No wheezing or rales.  Neurological:     Mental Status: He is alert.     No results found for any visits on 01/11/19.      Assessment & Plan    1. Essential hypertension Stable. Continue current treatment as below. May take every other day if BP starts running more low. He will return in 4 months for AWV with Carles Collet, PA-C. - lisinopril (ZESTRIL) 5 MG tablet; Take 1 tablet (5 mg total) by mouth daily.  Dispense: 90 tablet; Refill: [place], PA-C  [facility]

## 2019-01-11 ENCOUNTER — Ambulatory Visit (INDEPENDENT_AMBULATORY_CARE_PROVIDER_SITE_OTHER): Payer: Medicare Other | Admitting: Physician Assistant

## 2019-01-11 ENCOUNTER — Other Ambulatory Visit: Payer: Self-pay

## 2019-01-11 ENCOUNTER — Encounter: Payer: Self-pay | Admitting: Physician Assistant

## 2019-01-11 DIAGNOSIS — I10 Essential (primary) hypertension: Secondary | ICD-10-CM

## 2019-01-11 MED ORDER — LISINOPRIL 5 MG PO TABS: 5.0000 mg | ORAL_TABLET | Freq: Every day | ORAL | 1 refills | Status: DC

## 2019-01-25 ENCOUNTER — Ambulatory Visit (INDEPENDENT_AMBULATORY_CARE_PROVIDER_SITE_OTHER): Payer: Medicare Other

## 2019-01-25 ENCOUNTER — Other Ambulatory Visit: Payer: Self-pay

## 2019-01-25 DIAGNOSIS — Z23 Encounter for immunization: Secondary | ICD-10-CM | POA: Diagnosis not present

## 2019-03-22 DIAGNOSIS — M7130 Other bursal cyst, unspecified site: Secondary | ICD-10-CM | POA: Diagnosis not present

## 2019-03-28 IMAGING — MR MR KNEE*R* W/O CM
4 of 6 series · 18 of 40 positions shown · non-contrast
Comparison: None.

CLINICAL DATA: Status post fall. Right leg injury. Right knee
hemarthrosis.

EXAM:
MRI OF THE RIGHT KNEE WITHOUT CONTRAST
TECHNIQUE: Multiplanar, multisequence MR imaging of the knee was performed. No
intravenous contrast was administered.

[Series 7: PD fat-sat · coronal · 3.0mm · 0.31mm/px · 7 of 35 slices shown (1 of 4)]
[im 1/35]
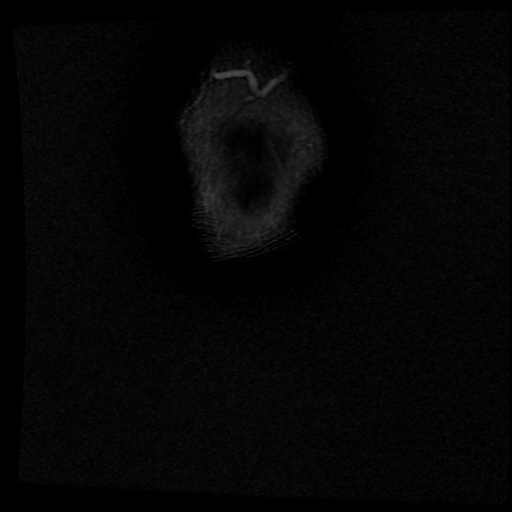
[im 6/35]
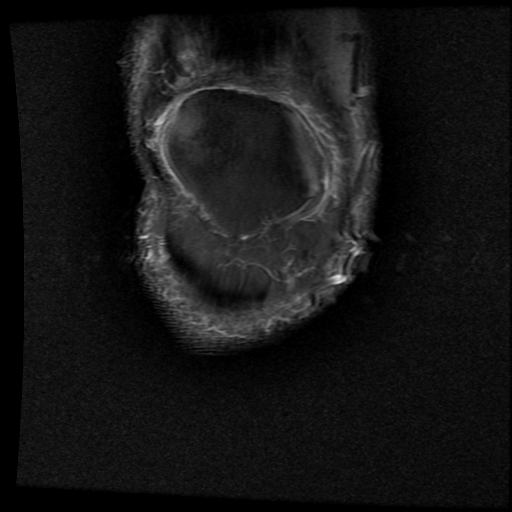
[im 12/35]
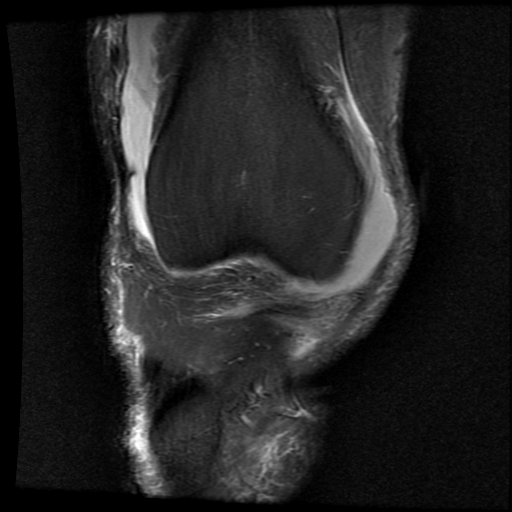
[im 18/35]
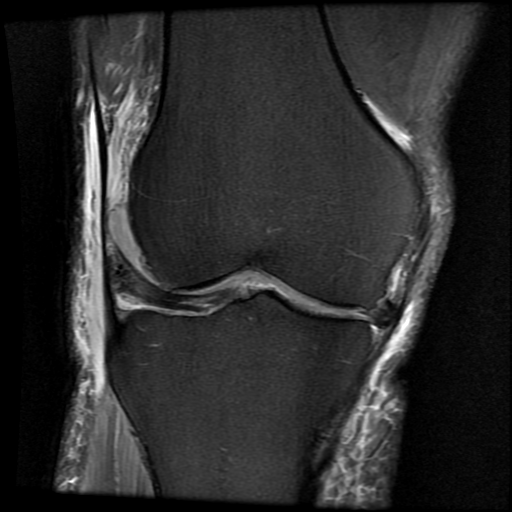
[im 23/35]
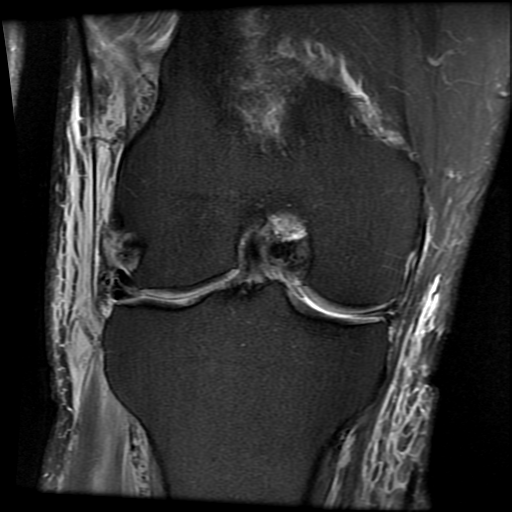
[im 29/35]
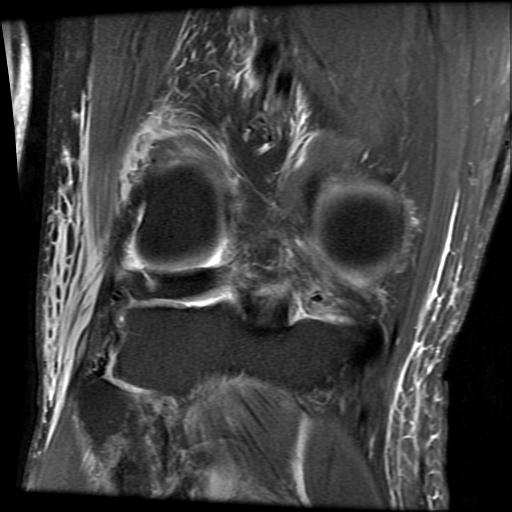
[im 35/35]
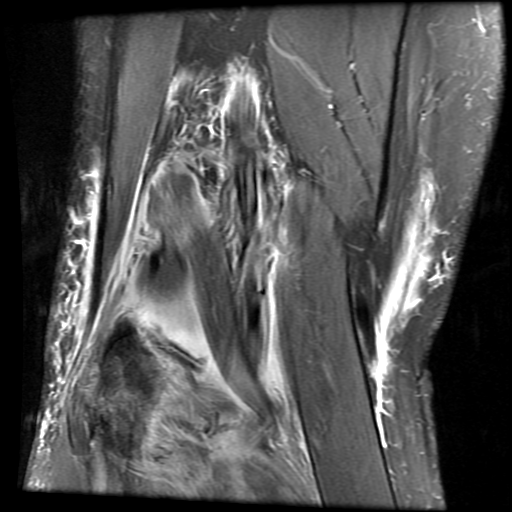

[Series 8: PD fat-sat · sagittal · 3.0mm · 0.33mm/px · 5 of 33 slices shown (2 of 4)]
[im 1/33]
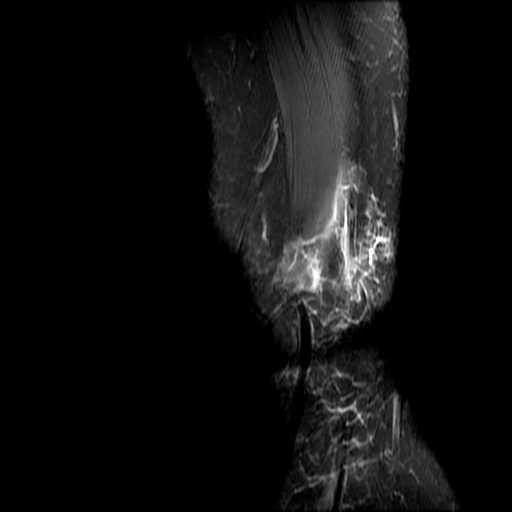
[im 6/33]
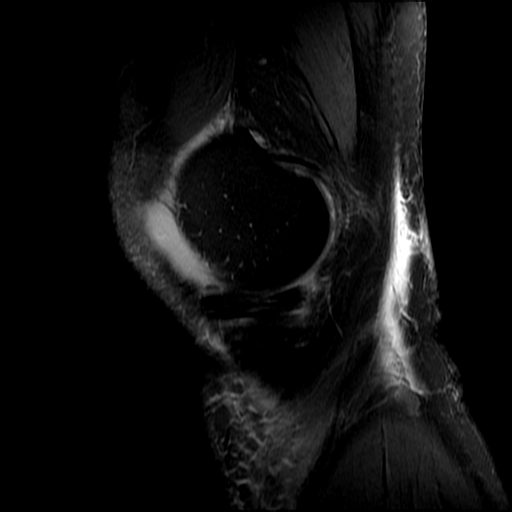
[im 11/33]
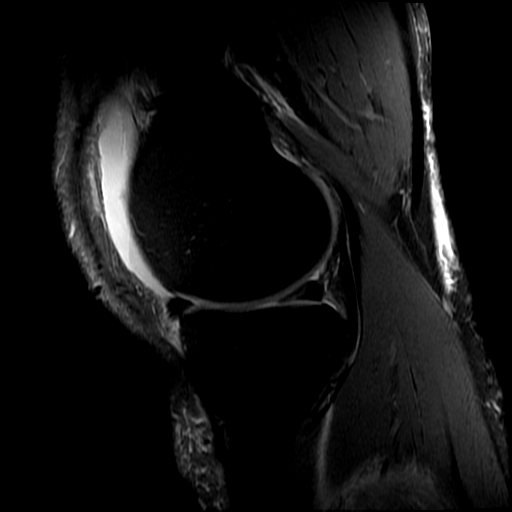
[im 17/33]
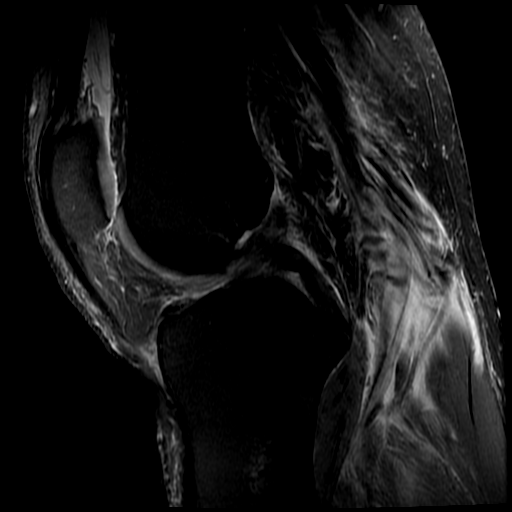
[im 27/33]
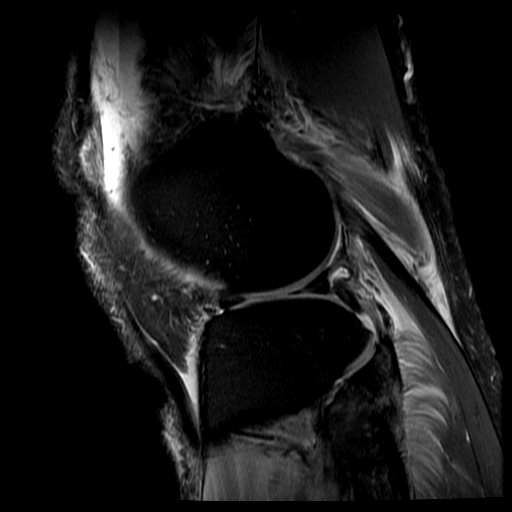

[Series 10: PD fat-sat · axial · 3.0mm · 0.33mm/px · z∈[-89,-0]mm · 3 of 38 slices shown (3 of 4)]
[im 6/38]
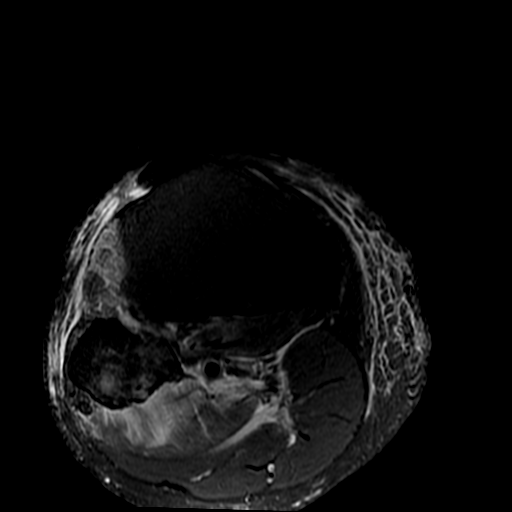
[im 22/38]
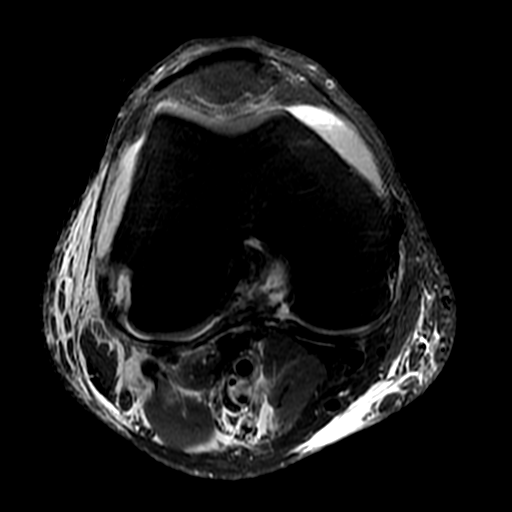
[im 32/38]
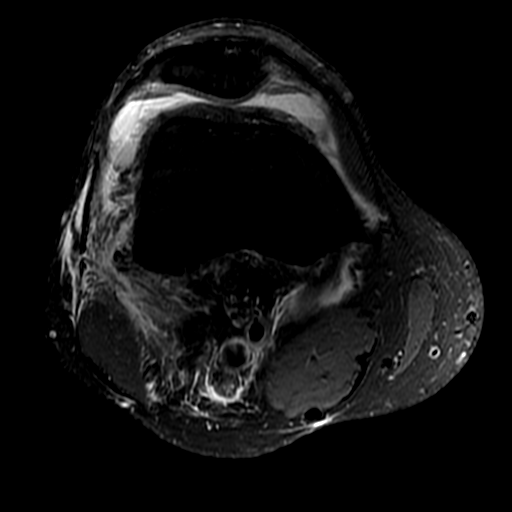

[Series 11: PD fat-sat · coronal · 2.0mm · 0.35mm/px · 3 of 15 slices shown (4 of 4)]
[im 1/15]
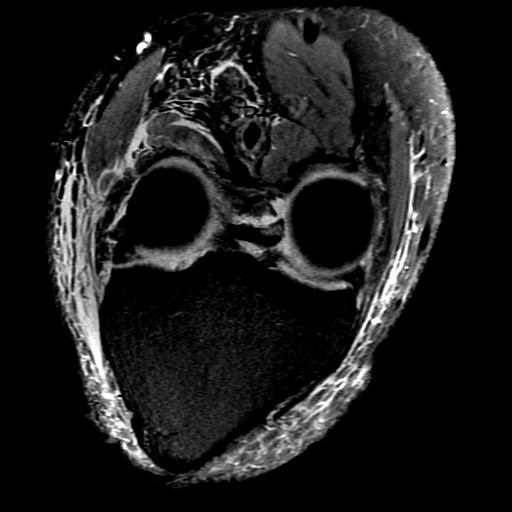
[im 8/15]
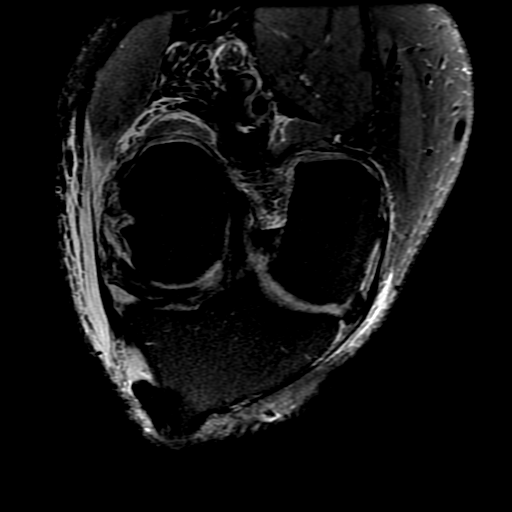
[im 15/15]
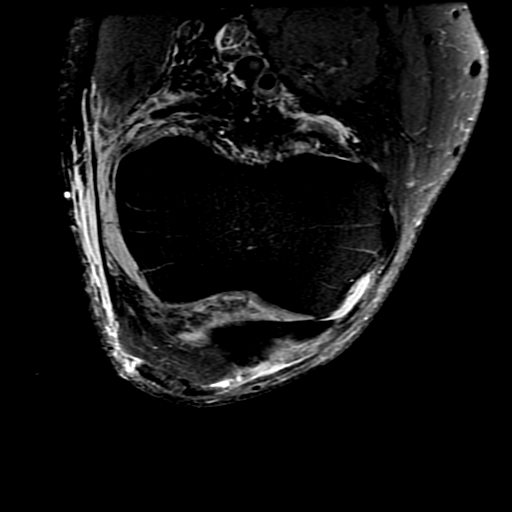

[18 of 40 positions shown; findings below may reference images not displayed]

FINDINGS: MENISCI

Medial meniscus: Small radial tear of the free edge of the body of
the medial meniscus.

Lateral meniscus: Radial tear of the free edge of the posterior horn
of the lateral meniscus. Horizontal tear of the body of the lateral
meniscus extending to the free edge.

LIGAMENTS

Cruciates:  Intact ACL and PCL.

Collaterals: Medial collateral ligament is intact. Fibular
collateral ligament is attenuated distally concerning for a partial
tear. Iliotibial band and biceps femoris tendon are intact.

CARTILAGE

Patellofemoral:  No chondral defect.

Medial: Partial-thickness cartilage loss of medial femorotibial
compartment.

Lateral: Partial-thickness cartilage loss of the lateral
femorotibial compartment.

Joint: Large hemarthrosis. Normal Hoffa's fat. No plical thickening.

Popliteal Fossa:  No Baker cyst.  Intact popliteus tendon.

Extensor Mechanism: Intact quadriceps tendon and patellar tendon.
Intact medial and lateral patellar retinaculum. Intact MPFL.

Bones: Comminuted fracture of the fibular head. No other marrow
signal abnormality. No other fracture or dislocation.

Other: Severe edema in the proximal soleus muscle and tibia us
anterior muscle likely reflecting muscle strain.
IMPRESSION: 1. Large hemarthrosis.
2. Comminuted fracture of the fibular head.
3. Small radial tear of the free edge of the body of the medial
meniscus.
4. Radial tear of the free edge of the posterior horn of the lateral
meniscus. Horizontal tear of the body of the lateral meniscus
extending to the free edge.
5. Fibular collateral ligament is attenuated distally concerning for
a partial tear. Iliotibial band and biceps femoris tendon are
intact.

## 2019-03-29 IMAGING — DX DG TIBIA/FIBULA PORT 2V*R*
1 series · 4 of 4 positions shown · non-contrast
Comparison: Radiographs dated 10/06/2016.  CT dated 10/06/2016.

CLINICAL DATA: Status post operative fixation of a right tibial
plafond fracture with fibula involvement.

EXAM:
PORTABLE RIGHT TIBIA AND FIBULA - 2 VIEW

[Series 1: leg · 0.14mm/px · 4 of 4 slices shown]
[im 1/4]
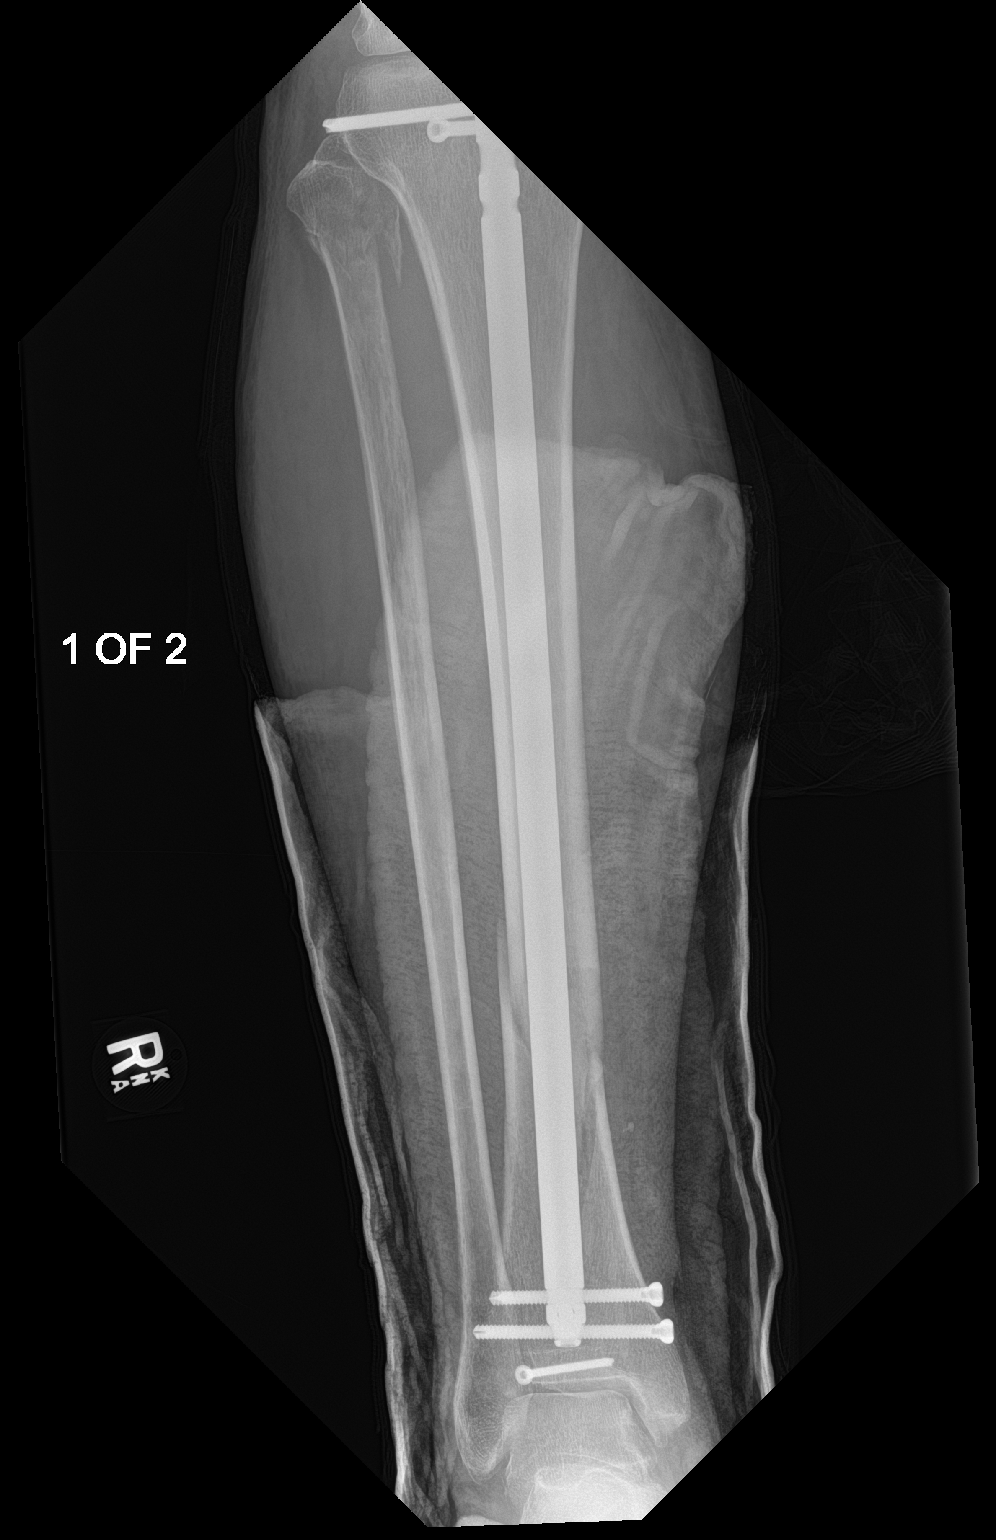
[im 2/4]
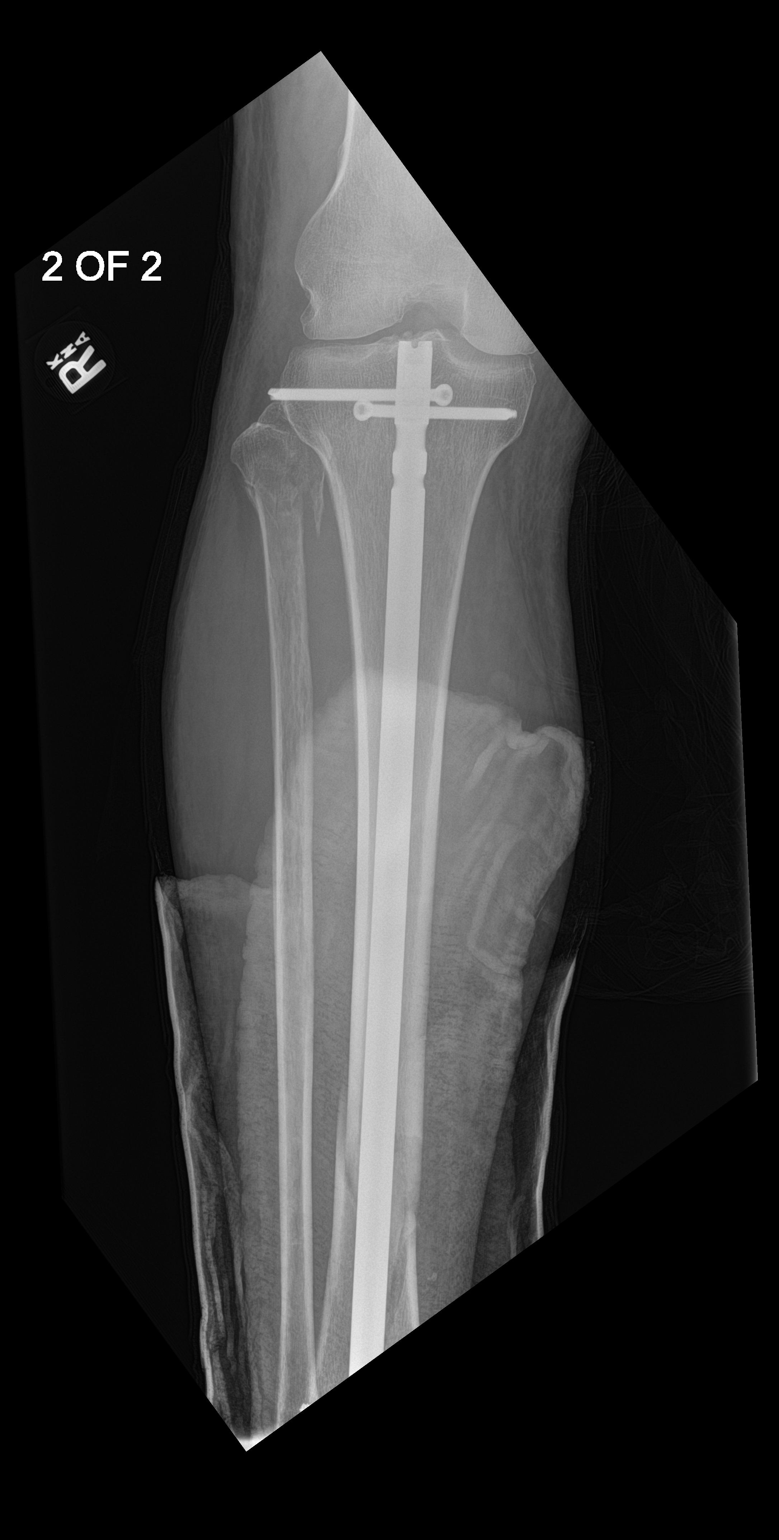
[im 3/4]
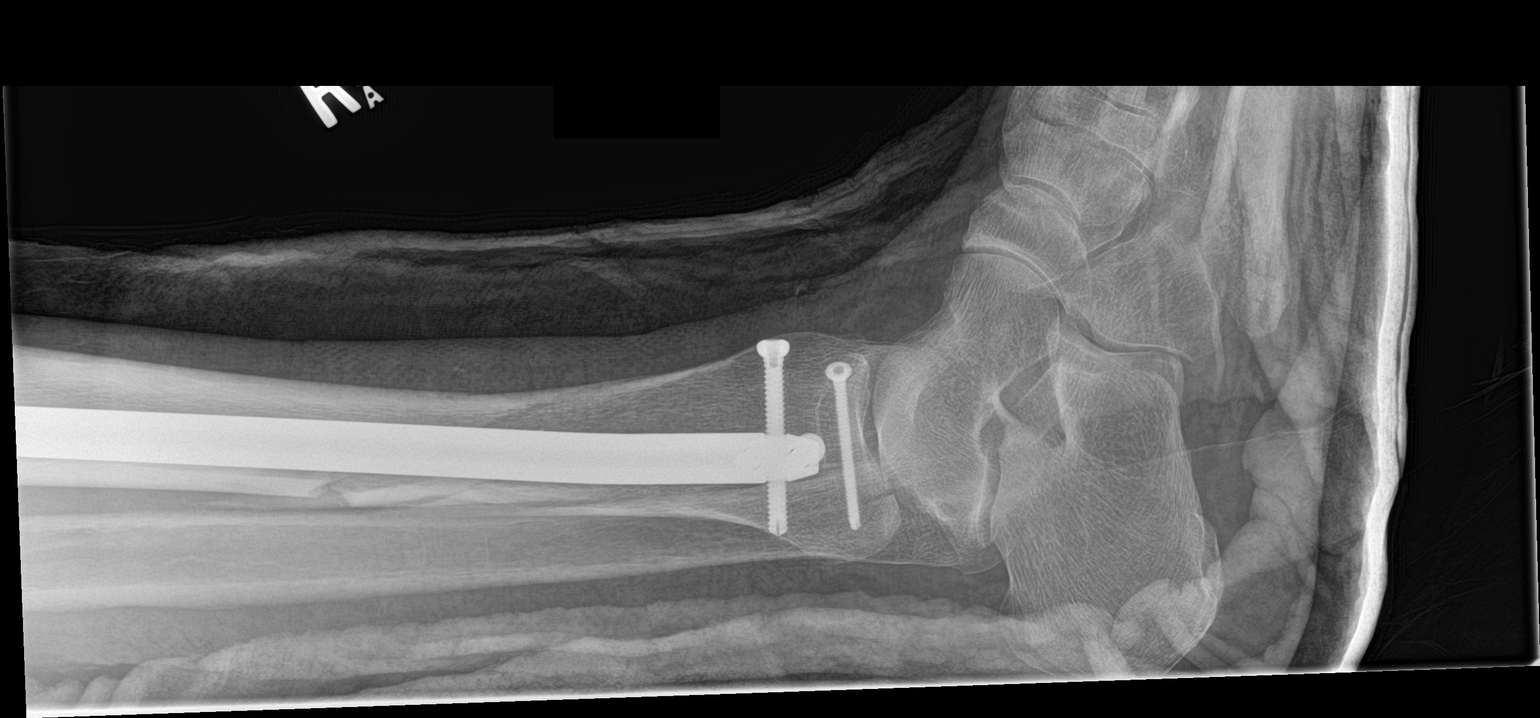
[im 4/4]
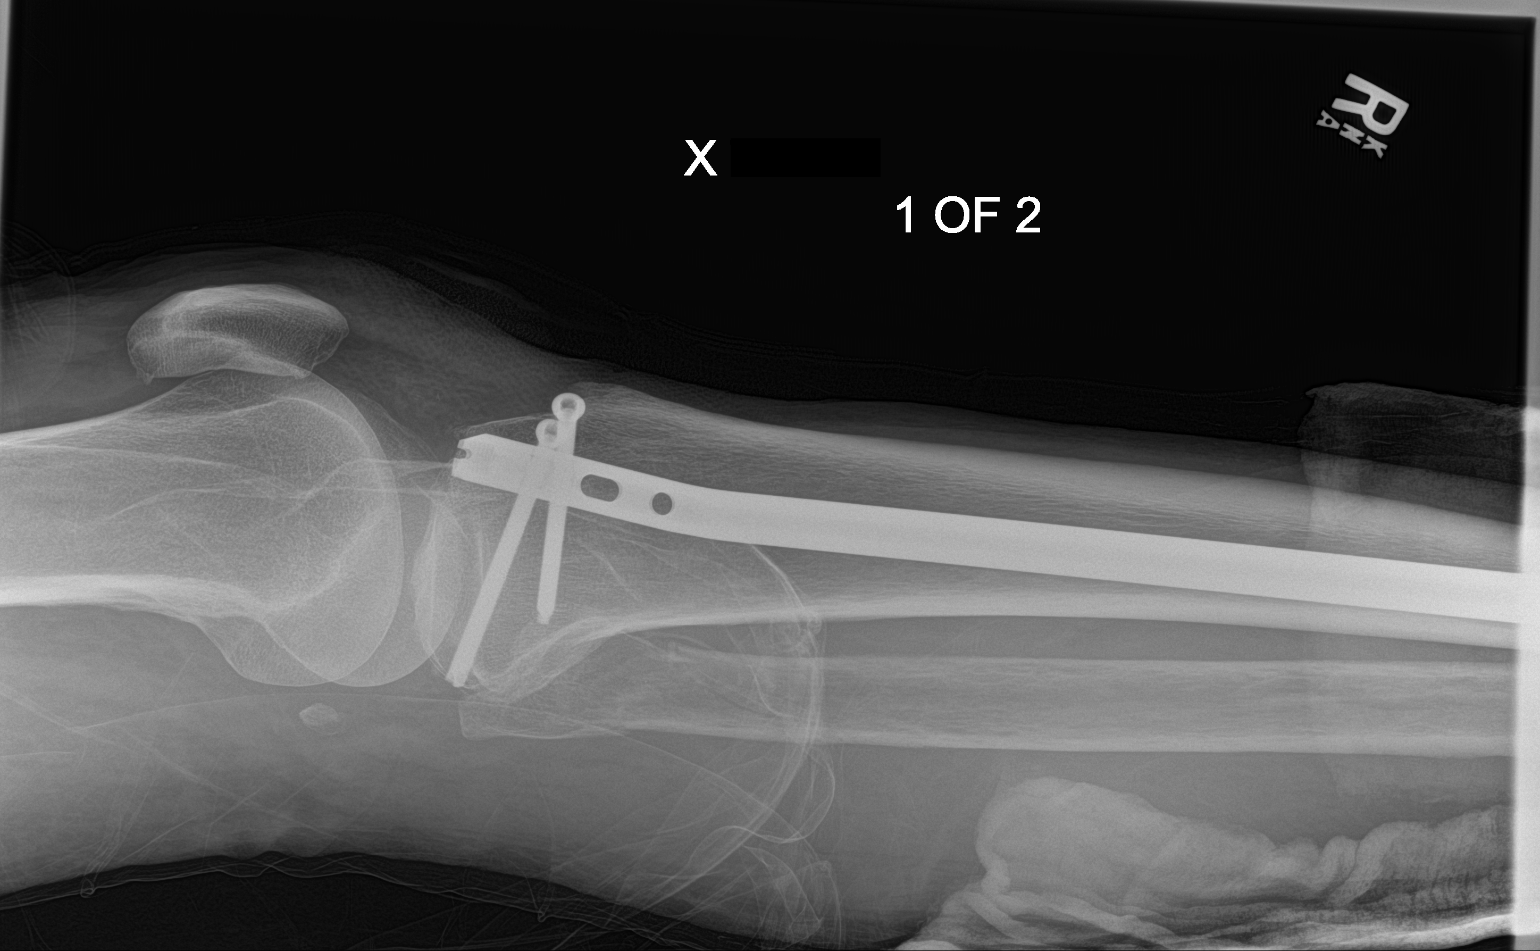

[4 of 4 positions shown; findings below may reference images not displayed]

FINDINGS: Interval intramedullary rod and screw fixation of the previously
demonstrated distal tibia fracture. Essentially anatomic position
and alignment. There is also screw fixation of the previously
demonstrated posterior malleolus fracture with anatomic position and
alignment. A comminuted and mildly impacted fibular neck and head
fracture has not changed significantly.
IMPRESSION: Hardware fixation of the previously demonstrated tibia fractures, as
described above. Stable fibular head and neck fracture.

## 2019-05-29 NOTE — Progress Notes (Signed)
Patient: Patrick Good, Male    DOB: [birth year], 79 y.o.   MRN: [id number] Visit Date: 05/30/2019  Today's Provider: Trinna Post, PA-C   Chief Complaint  Patient presents with  . Medicare Wellness   Subjective:     Annual wellness visit Patrick Good is a 79 y.o. male. He feels fairly well. He reports exercising includes walking. He reports he is sleeping well.  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   Last colonoscopy:09/25/2010, Dr. Minna Merritts diverticulosis and internal hemorrhoids and no polyps   Hypertension: Lisinopril 5 mg QD daily   BP Readings from Last 3 Encounters:  05/30/19 138/80  01/11/19 117/78  11/30/18 121/85   Shingles Shot: He took Zostavax in 2015, Shingrix  - not gotten yet -----------------------------------------------------------   Review of Systems  Constitutional: Negative.   HENT: Negative.   Eyes: Negative.   Respiratory: Negative.   Cardiovascular: Negative.   Gastrointestinal: Negative.   Endocrine: Negative.   Genitourinary: Negative.   Musculoskeletal: Negative.   Skin: Negative.   Allergic/Immunologic: Negative.   Neurological: Negative.   Hematological: Negative.   Psychiatric/Behavioral: Negative.     Social History   Socioeconomic History  . Marital status: Married    Spouse name: Not on file  . Number of children: 2  . Years of education: Not on file  . Highest education level: Not on file  Occupational History  . Occupation: Retired    Comment: Former Doctor, hospital working part time in [employer]  Tobacco Use  . Smoking status: Former Smoker    Packs/day: 0.50    Years: 25.00    Pack years: 12.50    Types: Cigarettes    Quit date: 05/04/1992    Years since quitting: 27.0  . Smokeless tobacco: Never Used  Substance and Sexual Activity  . Alcohol use: No    Alcohol/week: 0.0 standard drinks  . Drug use: No  . Sexual activity: Yes    Birth  control/protection: Surgical  Other Topics Concern  . Not on file  Social History Narrative  . Not on file   Social Determinants of Health   Financial Resource Strain:   . Difficulty of Paying Living Expenses: Not on file  Food Insecurity:   . Worried About Charity fundraiser in the Last Year: Not on file  . Ran Out of Food in the Last Year: Not on file  Transportation Needs:   . Lack of Transportation (Medical): Not on file  . Lack of Transportation (Non-Medical): Not on file  Physical Activity:   . Days of Exercise per Week: Not on file  . Minutes of Exercise per Session: Not on file  Stress:   . Feeling of Stress : Not on file  Social Connections:   . Frequency of Communication with Friends and Family: Not on file  . Frequency of Social Gatherings with Friends and Family: Not on file  . Attends Religious Services: Not on file  . Active Member of Clubs or Organizations: Not on file  . Attends Archivist Meetings: Not on file  . Marital Status: Not on file  Intimate Partner Violence:   . Fear of Current or Ex-Partner: Not on file  . Emotionally Abused: Not on file  . Physically Abused: Not on file  . Sexually Abused: Not on file    Past Medical History:  Diagnosis Date  . Arthritis    right index finger,  left ankle  . Closed fracture of right tibial plafond with fibula involvement 10/06/2016  . 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 Fracture of tibial shaft, right, closed 10/08/2016  . GERD (gastroesophageal reflux disease)    20 years ago  . History of ankle fracture 1982   spiral break; left ankle  . History of chicken pox   . History of measles   . Hypertension   . Pre-diabetes   . Vitamin D insufficiency 10/12/2016     Patient Active Problem List   Diagnosis Date Noted  . Hyperlipidemia 05/23/2018  . Screening for diabetes mellitus 05/20/2017  . Vitamin D insufficiency 10/12/2016  . History of colonic polyps 05/14/2015  .  Elevated IOP 05/14/2015    Past Surgical History:  Procedure Laterality Date  . CATARACT EXTRACTION W/PHACO Right 09/15/2017   Procedure: CATARACT EXTRACTION PHACO AND INTRAOCULAR LENS PLACEMENT ([place])  RIGHT TORIC;  Surgeon: Leandrew Koyanagi, MD;  Location: [facility];  Service: Ophthalmology;  Laterality: Right;  PT WANTS TO ARRIVE BETWEEN 9:30 AND 10  . CATARACT EXTRACTION W/PHACO Left 11/09/2017   Procedure: CATARACT EXTRACTION PHACO AND INTRAOCULAR LENS PLACEMENT ([place]) LEFT IVA TOPICAL;  Surgeon: Leandrew Koyanagi, MD;  Location: [facility];  Service: Ophthalmology;  Laterality: Left;  prefers arrival between 9 and 10  . HARDWARE REMOVAL Right 01/04/2018   Procedure: REMOVAL OF RIGHT TIBIAL HARDWARE;  Surgeon: Altamese Benavides, MD;  Location: [facility];  Service: Orthopedics;  Laterality: Right;  . HERNIA REPAIR  1946 and 1952  . KNEE SURGERY  12/2011   Left meniscus by Dr. Wylene Simmer in [place]  . Myocardial Perfusion Scan  07/29/2011   Normal  . ORIF ANKLE FRACTURE Left 1982  . ORIF ANKLE FRACTURE Right 10/09/2016   Procedure: OPEN REDUCTION INTERNAL FIXATION (ORIF) ANKLE FRACTURE;  Surgeon: Altamese Good Hope, MD;  Location: [facility];  Service: Orthopedics;  Laterality: Right;  . TIBIA IM NAIL INSERTION Right 10/09/2016   Procedure: INTRAMEDULLARY (IM) NAIL TIBIAL;  Surgeon: Altamese Dalton, MD;  Location: [facility];  Service: Orthopedics;  Laterality: Right;  Marland Kitchen VASECTOMY  1972    His family history includes Breast cancer in his sister; Diabetes type II in his sister; Heart Problems in his sister; Multiple sclerosis in his sister.   Current Outpatient Medications:  .  aspirin EC 81 MG tablet, Take 81 mg by mouth daily., Disp: , Rfl:  .  Cholecalciferol (VITAMIN D3 PO), Take 1 capsule by mouth daily., Disp: , Rfl:  .  latanoprost (XALATAN) 0.005 % ophthalmic solution, Place 1 drop into both eyes at bedtime. , Disp: , Rfl:  .  lisinopril (ZESTRIL) 5 MG tablet, Take 1 tablet (5  mg total) by mouth daily., Disp: 90 tablet, Rfl: 1 .  Multiple Vitamins-Minerals (PRESERVISION AREDS) TABS, Take by mouth., Disp: , Rfl:  .  Polyvinyl Alcohol-Povidone PF (REFRESH) 1.4-0.6 % SOLN, Place 1 drop into both eyes 4 (four) times daily as needed (for irritation). , Disp: , Rfl:   Patient Care Team: Mar Daring, PA-C as PCP - General (Family Medicine) Leandrew Koyanagi, MD as Referring Physician (Ophthalmology)    Objective:    Vitals: BP 138/80 (BP Location: Left Arm, Patient Position: Sitting, Cuff Size: Normal)   Pulse 65   Temp (!) 94.1 F (34.5 C) (Temporal)   Ht 6' 5.5" (1.969 m)   Wt 197 lb (89.4 kg)   BMI 23.06 kg/m   Physical Exam Constitutional:  Appearance: Normal appearance.  Cardiovascular:     Rate and Rhythm: Normal rate and regular rhythm.     Heart sounds: Normal heart sounds.  Pulmonary:     Effort: Pulmonary effort is normal.     Breath sounds: Normal breath sounds.  Abdominal:     General: Bowel sounds are normal.     Palpations: Abdomen is soft.  Skin:    General: Skin is warm and dry.  Neurological:     Mental Status: He is alert and oriented to person, place, and time. Mental status is at baseline.  Psychiatric:        Mood and Affect: Mood normal.        Behavior: Behavior normal.     Activities of Daily Living No flowsheet data found.  Fall Risk Assessment Fall Risk  05/23/2018 05/20/2017 11/06/2016 05/19/2016 05/15/2015  Falls in the past year? 0 Yes No No No  Number falls in past yr: - 1 - - -  Injury with Fall? - Yes - - -     Depression Screen PHQ 2/9 Scores 05/23/2018 05/23/2018 05/20/2017 11/06/2016  PHQ - 2 Score 0 0 0 0  PHQ- 9 Score 1 - - 1    No flowsheet data found.    Assessment & Plan:     Annual Wellness Visit  Reviewed patient's Family Medical History Reviewed and updated list of patient's medical providers Assessment of cognitive impairment was done Assessed patient's functional  ability Established a written schedule for health screening [place] Completed and Reviewed  Exercise Activities and Dietary recommendations Goals   None     Immunization History  Administered Date(s) Administered  . Fluad Quad(high Dose 65+) 01/25/2019  . Influenza, High Dose Seasonal PF 01/23/2015  . Influenza, Seasonal, Injecte, Preservative Fre 02/14/2016  . Influenza-Unspecified 02/04/2017, 02/09/2018  . Pneumococcal Conjugate-13 05/08/2014  . Pneumococcal Polysaccharide-23 03/22/2009  . Tdap 09/13/2007, 02/23/2018  . Zoster 06/02/2005    Health Maintenance  Topic Date Due  . TETANUS/TDAP  02/24/2028  . INFLUENZA VACCINE  Completed  . PNA vac Low Risk Adult  Completed     Discussed health benefits of physical activity, and encouraged him to engage in regular exercise appropriate for his age and condition.    1. Annual physical exam  - PSA  2. Prediabetes  - HgB A1c  3. Hyperlipidemia, unspecified hyperlipidemia type  - Lipid panel  4. Hypertension, unspecified type  - Comprehensive metabolic panel  5. Essential hypertension  - lisinopril (ZESTRIL) 5 MG tablet; Take 1 tablet (5 mg total) by mouth daily.  Dispense: 90 tablet; Refill: 3  The entirety of the information documented in the History of Present Illness, Review of Systems and Physical Exam were personally obtained by me. Portions of this information were initially documented by [facility] and reviewed by me for thoroughness and accuracy.   ------------------------------------------------------------------------------------------------------------    Trinna Post, PA-C  [facility]

## 2019-05-30 ENCOUNTER — Other Ambulatory Visit: Payer: Self-pay

## 2019-05-30 ENCOUNTER — Encounter: Payer: Self-pay | Admitting: Physician Assistant

## 2019-05-30 ENCOUNTER — Ambulatory Visit (INDEPENDENT_AMBULATORY_CARE_PROVIDER_SITE_OTHER): Payer: Medicare Other | Admitting: Physician Assistant

## 2019-05-30 VITALS — BP 138/80 | HR 65 | Temp 94.1°F | Ht 77.5 in | Wt 197.0 lb

## 2019-05-30 DIAGNOSIS — E785 Hyperlipidemia, unspecified: Secondary | ICD-10-CM | POA: Diagnosis not present

## 2019-05-30 DIAGNOSIS — R350 Frequency of micturition: Secondary | ICD-10-CM

## 2019-05-30 DIAGNOSIS — R7303 Prediabetes: Secondary | ICD-10-CM | POA: Diagnosis not present

## 2019-05-30 DIAGNOSIS — I10 Essential (primary) hypertension: Secondary | ICD-10-CM | POA: Diagnosis not present

## 2019-05-30 MED ORDER — LISINOPRIL 5 MG PO TABS: 5.0000 mg | ORAL_TABLET | Freq: Every day | ORAL | 3 refills | Status: DC

## 2019-05-30 NOTE — Patient Instructions (Signed)
Health Maintenance After Age 77 After age 77, you are at a higher risk for certain long-term diseases and infections as well as injuries from falls. Falls are a major cause of broken bones and head injuries in people who are older than age 77. Getting regular preventive care can help to keep you healthy and well. Preventive care includes getting regular testing and making lifestyle changes as recommended by your health care provider. Talk with your health care provider about:  Which screenings and tests you should have. A screening is a test that checks for a disease when you have no symptoms.  A diet and exercise plan that is right for you. What should I know about screenings and tests to prevent falls? Screening and testing are the best ways to find a health problem early. Early diagnosis and treatment give you the best chance of managing medical conditions that are common after age 77. Certain conditions and lifestyle choices may make you more likely to have a fall. Your health care provider may recommend:  Regular vision checks. Poor vision and conditions such as cataracts can make you more likely to have a fall. If you wear glasses, make sure to get your prescription updated if your vision changes.  Medicine review. Work with your health care provider to regularly review all of the medicines you are taking, including over-the-counter medicines. Ask your health care provider about any side effects that may make you more likely to have a fall. Tell your health care provider if any medicines that you take make you feel dizzy or sleepy.  Osteoporosis screening. Osteoporosis is a condition that causes the bones to get weaker. This can make the bones weak and cause them to break more easily.  Blood pressure screening. Blood pressure changes and medicines to control blood pressure can make you feel dizzy.  Strength and balance checks. Your health care provider may recommend certain tests to check your  strength and balance while standing, walking, or changing positions.  Foot health exam. Foot pain and numbness, as well as not wearing proper footwear, can make you more likely to have a fall.  Depression screening. You may be more likely to have a fall if you have a fear of falling, feel emotionally low, or feel unable to do activities that you used to do.  Alcohol use screening. Using too much alcohol can affect your balance and may make you more likely to have a fall. What actions can I take to lower my risk of falls? General instructions  Talk with your health care provider about your risks for falling. Tell your health care provider if: ? You fall. Be sure to tell your health care provider about all falls, even ones that seem minor. ? You feel dizzy, sleepy, or off-balance.  Take over-the-counter and prescription medicines only as told by your health care provider. These include any supplements.  Eat a healthy diet and maintain a healthy weight. A healthy diet includes low-fat dairy products, low-fat (lean) meats, and fiber from whole grains, beans, and lots of fruits and vegetables. Home safety  Remove any tripping hazards, such as rugs, cords, and clutter.  Install safety equipment such as grab bars in bathrooms and safety rails on stairs.  Keep rooms and walkways well-lit. Activity   Follow a regular exercise program to stay fit. This will help you maintain your balance. Ask your health care provider what types of exercise are appropriate for you.  If you need a cane or   walker, use it as recommended by your health care provider.  Wear supportive shoes that have nonskid soles. Lifestyle  Do not drink alcohol if your health care provider tells you not to drink.  If you drink alcohol, limit how much you have: ? 0-1 drink a day for women. ? 0-2 drinks a day for men.  Be aware of how much alcohol is in your drink. In the [place] one drink equals one typical bottle of beer (12  oz), one-half glass of wine (5 oz), or one shot of hard liquor (1 oz).  Do not use any products that contain nicotine or tobacco, such as cigarettes and e-cigarettes. If you need help quitting, ask your health care provider. Summary  Having a healthy lifestyle and getting preventive care can help to protect your health and wellness after age 77.  Screening and testing are the best way to find a health problem early and help you avoid having a fall. Early diagnosis and treatment give you the best chance for managing medical conditions that are more common for people who are older than age 77.  Falls are a major cause of broken bones and head injuries in people who are older than age 77. Take precautions to prevent a fall at home.  Work with your health care provider to learn what changes you can make to improve your health and wellness and to prevent falls. This information is not intended to replace advice given to you by your health care provider. Make sure you discuss any questions you have with your health care provider. Document Revised: 08/11/2018 Document Reviewed: 03/03/2017 Elsevier Patient Education  2020 Elsevier Inc.  

## 2019-05-31 LAB — LIPID PANEL
Chol/HDL Ratio: 4.1 ratio (ref 0.0–5.0)
Cholesterol, Total: 172 mg/dL (ref 100–199)
HDL: 42 mg/dL (ref >39)
LDL Chol Calc (NIH): 118 mg/dL — ABNORMAL HIGH (ref 0–99)
Triglycerides: 63 mg/dL (ref 0–149)
VLDL Cholesterol Cal: 12 mg/dL (ref 5–40)

## 2019-05-31 LAB — COMPREHENSIVE METABOLIC PANEL
ALT: 31 IU/L (ref 0–44)
AST: 32 IU/L (ref 0–40)
Albumin/Globulin Ratio: 1.7 (ref 1.2–2.2)
Albumin: 4.3 g/dL (ref 3.7–4.7)
Alkaline Phosphatase: 69 IU/L (ref 39–117)
BUN/Creatinine Ratio: 21 (ref 10–24)
BUN: 22 mg/dL (ref 8–27)
Bilirubin Total: 0.4 mg/dL (ref 0.0–1.2)
CO2: 25 mmol/L (ref 20–29)
Calcium: 9.4 mg/dL (ref 8.6–10.2)
Chloride: 105 mmol/L (ref 96–106)
Creatinine, Ser: 1.07 mg/dL (ref 0.76–1.27)
GFR calc Af Amer: 78 mL/min/{1.73_m2} (ref >59)
GFR calc non Af Amer: 67 mL/min/{1.73_m2} (ref >59)
Globulin, Total: 2.5 g/dL (ref 1.5–4.5)
Glucose: 104 mg/dL — ABNORMAL HIGH (ref 65–99)
Potassium: 4.8 mmol/L (ref 3.5–5.2)
Sodium: 145 mmol/L — ABNORMAL HIGH (ref 134–144)
Total Protein: 6.8 g/dL (ref 6.0–8.5)

## 2019-05-31 LAB — HEMOGLOBIN A1C
Est. average glucose Bld gHb Est-mCnc: 114 mg/dL
Hgb A1c MFr Bld: 5.6 % (ref 4.8–5.6)

## 2019-05-31 LAB — PSA: Prostate Specific Ag, Serum: 1.9 ng/mL (ref 0.0–4.0)

## 2019-06-09 ENCOUNTER — Telehealth: Payer: Self-pay

## 2019-06-09 DIAGNOSIS — E785 Hyperlipidemia, unspecified: Secondary | ICD-10-CM

## 2019-06-09 MED ORDER — PRAVASTATIN SODIUM 40 MG PO TABS: 40.0000 mg | ORAL_TABLET | Freq: Every day | ORAL | 1 refills | Status: DC

## 2019-06-09 NOTE — Telephone Encounter (Signed)
Patient was advised and states that he will call back another time to schedule the office visit & labs.

## 2019-06-09 NOTE — Telephone Encounter (Signed)
Patient call was the returned. Patient states that he looked up the side effects on the Lipitor and he does not want to try that medication instead he would like to try Pravachol (Pravastatin) 40 MG. Patient states that he spoke with his insurance company and they will cover the medication. Patient wanted Adriana to know that the Lipitor had 200 side effect.Please advise.

## 2019-06-09 NOTE — Telephone Encounter (Signed)
Sent for him. He can follow up in 3-6 months for OV and labs.

## 2019-06-13 NOTE — Addendum Note (Signed)
Addended by: Trinna Post on: 06/13/2019 09:16 AM   Modules accepted: Level of Service

## 2019-11-20 ENCOUNTER — Other Ambulatory Visit: Payer: Self-pay | Admitting: Physician Assistant

## 2019-11-20 DIAGNOSIS — E785 Hyperlipidemia, unspecified: Secondary | ICD-10-CM

## 2020-03-22 ENCOUNTER — Encounter: Payer: Self-pay | Admitting: Physician Assistant

## 2020-05-30 ENCOUNTER — Ambulatory Visit: Payer: Medicare Other | Admitting: Physician Assistant

## 2020-06-03 ENCOUNTER — Ambulatory Visit (INDEPENDENT_AMBULATORY_CARE_PROVIDER_SITE_OTHER): Payer: Medicare Other | Admitting: Physician Assistant

## 2020-06-03 ENCOUNTER — Other Ambulatory Visit: Payer: Self-pay

## 2020-06-03 ENCOUNTER — Encounter: Payer: Self-pay | Admitting: Physician Assistant

## 2020-06-03 VITALS — BP 108/68 | HR 86 | Temp 98.0°F | Resp 16 | Ht 75.5 in | Wt 192.8 lb

## 2020-06-03 DIAGNOSIS — I10 Essential (primary) hypertension: Secondary | ICD-10-CM | POA: Diagnosis not present

## 2020-06-03 DIAGNOSIS — Z1211 Encounter for screening for malignant neoplasm of colon: Secondary | ICD-10-CM | POA: Diagnosis not present

## 2020-06-03 DIAGNOSIS — E559 Vitamin D deficiency, unspecified: Secondary | ICD-10-CM | POA: Diagnosis not present

## 2020-06-03 DIAGNOSIS — Z0000 Encounter for general adult medical examination without abnormal findings: Secondary | ICD-10-CM | POA: Diagnosis not present

## 2020-06-03 DIAGNOSIS — E785 Hyperlipidemia, unspecified: Secondary | ICD-10-CM

## 2020-06-03 DIAGNOSIS — R7303 Prediabetes: Secondary | ICD-10-CM

## 2020-06-03 MED ORDER — PRAVASTATIN SODIUM 40 MG PO TABS: 40.0000 mg | ORAL_TABLET | Freq: Every day | ORAL | 3 refills | Status: DC

## 2020-06-03 MED ORDER — LISINOPRIL 5 MG PO TABS: 5.0000 mg | ORAL_TABLET | Freq: Every day | ORAL | 3 refills | Status: DC

## 2020-06-03 NOTE — Progress Notes (Signed)
Annual Wellness Visit     Patient: Patrick Good, Male    DOB: February 09, 1941, 80 y.o.   MRN: [id number] Visit Date: 06/03/2020  Today's Provider: Mar Daring, PA-C   Chief Complaint  Patient presents with  . Medicare Wellness   Subjective    Patrick Good is a 80 y.o. male who presents today for his Annual Wellness Visit. He reports consuming a general diet. Gym/ health club routine includes walking on track . He generally feels fairly well. He reports sleeping fairly well. He does not have additional problems to discuss today.   HPI Glaucoma is followed by ophthalmology every 6 months.  Patient Active Problem List   Diagnosis Date Noted  . Hyperlipidemia 05/23/2018  . Screening for diabetes mellitus 05/20/2017  . Vitamin D insufficiency 10/12/2016  . History of colonic polyps 05/14/2015  . Elevated IOP 05/14/2015   Past Surgical History:  Procedure Laterality Date  . CATARACT EXTRACTION W/PHACO Right 09/15/2017   Procedure: CATARACT EXTRACTION PHACO AND INTRAOCULAR LENS PLACEMENT ([place])  RIGHT TORIC;  Surgeon: Leandrew Koyanagi, MD;  Location: [place];  Service: Ophthalmology;  Laterality: Right;  PT WANTS TO ARRIVE BETWEEN 9:30 AND 10  . CATARACT EXTRACTION W/PHACO Left 11/09/2017   Procedure: CATARACT EXTRACTION PHACO AND INTRAOCULAR LENS PLACEMENT ([place]) LEFT IVA TOPICAL;  Surgeon: Leandrew Koyanagi, MD;  Location: [place];  Service: Ophthalmology;  Laterality: Left;  prefers arrival between 9 and 10  . HARDWARE REMOVAL Right 01/04/2018   Procedure: REMOVAL OF RIGHT TIBIAL HARDWARE;  Surgeon: Altamese [place], MD;  Location: [place];  Service: Orthopedics;  Laterality: Right;  . HERNIA REPAIR  1946 and 1952  . KNEE SURGERY  12/2011   Left meniscus by Dr. Wylene Simmer in [place]  . Myocardial Perfusion Scan  07/29/2011   Normal  . ORIF ANKLE FRACTURE Left 1982  . ORIF ANKLE FRACTURE Right 10/09/2016   Procedure: OPEN REDUCTION  INTERNAL FIXATION (ORIF) ANKLE FRACTURE;  Surgeon: Altamese [place], MD;  Location: [place];  Service: Orthopedics;  Laterality: Right;  . TIBIA IM NAIL INSERTION Right 10/09/2016   Procedure: INTRAMEDULLARY (IM) NAIL TIBIAL;  Surgeon: Altamese , MD;  Location: [place];  Service: Orthopedics;  Laterality: Right;  Marland Kitchen VASECTOMY  1972   Social History   Socioeconomic History  . Marital status: Married    Spouse name: Not on file  . Number of children: 2  . Years of education: Not on file  . Highest education level: Not on file  Occupational History  . Occupation: Retired    Comment: Former Doctor, hospital working part time in [employer]  Tobacco Use  . Smoking status: Former Smoker    Packs/day: 0.50    Years: 25.00    Pack years: 12.50    Types: Cigarettes    Quit date: 05/04/1992    Years since quitting: 28.1  . Smokeless tobacco: Never Used  Vaping Use  . Vaping Use: Never used  Substance and Sexual Activity  . Alcohol use: No    Alcohol/week: 0.0 standard drinks  . Drug use: No  . Sexual activity: Yes    Birth control/protection: Surgical  Other Topics Concern  . Not on file  Social History Narrative  . Not on file   Social Determinants of Health   Financial Resource Strain: Not on file  Food Insecurity: Not on file  Transportation Needs: Not on file  Physical Activity: Not on file  Stress: Not on file  Social Connections: Not on file  Intimate Partner Violence: Not on file   Family History  Problem Relation Age of Onset  . Heart Problems Sister   . Diabetes type II Sister   . Breast cancer Sister   . Multiple sclerosis Sister    Allergies  Allergen Reactions  . Tape Rash and Other (See Comments)    Plastic        Medications: Outpatient Medications Prior to Visit  Medication Sig  . Cholecalciferol (VITAMIN D3 PO) Take 1 capsule by mouth daily.  Marland Kitchen latanoprost (XALATAN) 0.005 % ophthalmic solution Place 1 drop into both eyes at bedtime.   Marland Kitchen  lisinopril (ZESTRIL) 5 MG tablet Take 1 tablet (5 mg total) by mouth daily.  . Multiple Vitamins-Minerals (PRESERVISION AREDS) TABS Take by mouth.  . Polyvinyl Alcohol-Povidone PF 1.4-0.6 % SOLN Place 1 drop into both eyes 4 (four) times daily as needed (for irritation).   . pravastatin (PRAVACHOL) 40 MG tablet TAKE 1 TABLET DAILY  . XIIDRA 5 % SOLN   . [DISCONTINUED] aspirin EC 81 MG tablet Take 81 mg by mouth daily. (Patient not taking: Reported on 06/03/2020)   No facility-administered medications prior to visit.    Allergies  Allergen Reactions  . Tape Rash and Other (See Comments)    Plastic     Patient Care Team: Mar Daring, PA-C as PCP - General (Family Medicine) Leandrew Koyanagi, MD as Referring Physician (Ophthalmology)  Review of Systems  Constitutional: Positive for fatigue.  HENT: Positive for postnasal drip.   Eyes: Positive for redness.  Respiratory: Negative.   Cardiovascular: Negative.   Gastrointestinal: Negative.   Endocrine: Negative.   Genitourinary: Negative.   Musculoskeletal: Negative.   Skin: Negative.   Allergic/Immunologic: Negative.   Neurological: Negative.   Hematological: Negative.   Psychiatric/Behavioral: Negative.     Last CBC Lab Results  Component Value Date   WBC 5.1 01/04/2018   HGB 15.0 01/04/2018   HCT 45.1 01/04/2018   MCV 91.7 01/04/2018   MCH 30.5 01/04/2018   RDW 13.2 01/04/2018   PLT 132 (L) [id number]   Last metabolic panel Lab Results  Component Value Date   GLUCOSE 104 (H) 05/30/2019   NA 145 (H) 05/30/2019   K 4.8 05/30/2019   CL 105 05/30/2019   CO2 25 05/30/2019   BUN 22 05/30/2019   CREATININE 1.07 05/30/2019   GFRNONAA 67 05/30/2019   GFRAA 78 05/30/2019   CALCIUM 9.4 05/30/2019   PHOS 3.5 10/17/2016   PROT 6.8 05/30/2019   ALBUMIN 4.3 05/30/2019   LABGLOB 2.5 05/30/2019   AGRATIO 1.7 05/30/2019   BILITOT 0.4 05/30/2019   ALKPHOS 69 05/30/2019   AST 32 05/30/2019   ALT 31 05/30/2019    ANIONGAP 7 01/04/2018   Last lipids Lab Results  Component Value Date   CHOL 172 05/30/2019   HDL 42 05/30/2019   LDLCALC 118 (H) 05/30/2019   TRIG 63 05/30/2019   CHOLHDL 4.1 05/30/2019   Last hemoglobin A1c Lab Results  Component Value Date   HGBA1C 5.6 05/30/2019   Last thyroid functions No results found for: TSH, T3TOTAL, T4TOTAL, THYROIDAB Last vitamin D Lab Results  Component Value Date   VD25OH 27.6 (L) 10/09/2016   Last vitamin B12 and Folate No results found for: VITAMINB12, FOLATE    Objective    Vitals: BP 108/68 (BP Location: Right Arm, Patient Position: Sitting, Cuff Size: Normal)   Pulse 86   Temp 98 F (36.7 C) (Oral)  Resp 16   Ht 6' 3.5" (1.918 m)   Wt 192 lb 12.8 oz (87.5 kg)   BMI 23.78 kg/m  BP Readings from Last 3 Encounters:  06/03/20 108/68  05/30/19 138/80  01/11/19 117/78   Wt Readings from Last 3 Encounters:  06/03/20 192 lb 12.8 oz (87.5 kg)  05/30/19 197 lb (89.4 kg)  01/11/19 197 lb 12.8 oz (89.7 kg)      Physical Exam Vitals reviewed.  Constitutional:      General: He is not in acute distress.    Appearance: Normal appearance. He is well-developed and normal weight. He is not ill-appearing.  HENT:     Head: Normocephalic and atraumatic.     Right Ear: Tympanic membrane, ear canal and external ear normal.     Left Ear: Tympanic membrane, ear canal and external ear normal.  Eyes:     General: No scleral icterus.       Right eye: No discharge.        Left eye: No discharge.     Extraocular Movements: Extraocular movements intact.     Conjunctiva/sclera: Conjunctivae normal.     Pupils: Pupils are equal, round, and reactive to light.  Neck:     Thyroid: No thyromegaly.     Vascular: No carotid bruit.     Trachea: No tracheal deviation.  Cardiovascular:     Rate and Rhythm: Normal rate and regular rhythm.     Pulses: Normal pulses.     Heart sounds: Normal heart sounds. No murmur heard.   Pulmonary:     Effort:  Pulmonary effort is normal. No respiratory distress.     Breath sounds: Normal breath sounds. No wheezing or rales.  Chest:     Chest wall: No tenderness.  Abdominal:     General: Abdomen is flat. Bowel sounds are normal. There is no distension.     Palpations: Abdomen is soft. There is no mass.     Tenderness: There is no abdominal tenderness. There is no guarding or rebound.  Musculoskeletal:        General: No tenderness. Normal range of motion.     Cervical back: Normal range of motion and neck supple. No tenderness.     Right lower leg: No edema.     Left lower leg: No edema.  Lymphadenopathy:     Cervical: No cervical adenopathy.  Skin:    General: Skin is warm and dry.     Capillary Refill: Capillary refill takes less than 2 seconds.     Findings: No erythema or rash.  Neurological:     General: No focal deficit present.     Mental Status: He is alert and oriented to person, place, and time. Mental status is at baseline.     Cranial Nerves: No cranial nerve deficit.     Motor: No abnormal muscle tone.     Coordination: Coordination normal.     Deep Tendon Reflexes: Reflexes are normal and symmetric. Reflexes normal.  Psychiatric:        Mood and Affect: Mood normal.        Behavior: Behavior normal.        Thought Content: Thought content normal.        Judgment: Judgment normal.     Most recent functional status assessment: In your present state of health, do you have any difficulty performing the following activities: 06/03/2020  Hearing? N  Vision? Y  Difficulty concentrating or making decisions? N  Walking or  climbing stairs? N  Dressing or bathing? N  Doing errands, shopping? N  Some recent data might be hidden   Most recent fall risk assessment: Fall Risk  06/03/2020  Falls in the past year? 0  Number falls in past yr: 0  Injury with Fall? 0  Risk for fall due to : No Fall Risks  Follow up Falls evaluation completed    Most recent depression  screenings: PHQ 2/9 Scores 06/03/2020 05/30/2019  PHQ - 2 Score 0 0  PHQ- 9 Score 1 2   Most recent cognitive screening: 6CIT Screen 06/03/2020  What Year? 0 points  What month? 0 points  What time? 0 points  Count back from 20 0 points  Months in reverse 0 points  Repeat phrase 0 points  Total Score 0   Most recent Audit-C alcohol use screening Alcohol Use Disorder Test (AUDIT) 06/03/2020  1. How often do you have a drink containing alcohol? 0  2. How many drinks containing alcohol do you have on a typical day when you are drinking? 0  3. How often do you have six or more drinks on one occasion? 0  AUDIT-C Score 0  Alcohol Brief Interventions/Follow-up AUDIT Score <7 follow-up not indicated   A score of 3 or more in women, and 4 or more in men indicates increased risk for alcohol abuse, EXCEPT if all of the points are from question 1   No results found for any visits on 06/03/20.  Assessment & Plan     Annual wellness visit done today including the all of the following: Reviewed patient's Family Medical History Reviewed and updated list of patient's medical providers Assessment of cognitive impairment was done Assessed patient's functional ability Established a written schedule for health screening [place] Completed and Reviewed  Exercise Activities and Dietary recommendations Goals   None     Immunization History  Administered Date(s) Administered  . Fluad Quad(high Dose 65+) 01/25/2019  . Influenza, High Dose Seasonal PF 01/23/2015  . Influenza, Seasonal, Injecte, Preservative Fre 02/14/2016  . Influenza-Unspecified 02/04/2017, 02/09/2018  . Pneumococcal Conjugate-13 05/08/2014  . Pneumococcal Polysaccharide-23 03/22/2009  . Tdap 09/13/2007, 02/23/2018  . Zoster 06/02/2005    Health Maintenance  Topic Date Due  . Hepatitis C Screening  Never done  . COVID-19 Vaccine (1) Never done  . INFLUENZA VACCINE  12/03/2019  . TETANUS/TDAP   02/24/2028  . PNA vac Low Risk Adult  Completed     Discussed health benefits of physical activity, and encouraged him to engage in regular exercise appropriate for his age and condition.    1. Encounter for subsequent annual wellness visit in Medicare patient Normal physical exam today. Will check labs as below and f/u pending lab results. If labs are stable and WNL he will not need to have these rechecked for one year at his next annual physical exam. He is to call the office in the meantime if he has any acute issue, questions or concerns. - CBC with Differential/Platelet - Comprehensive metabolic panel - Hemoglobin A1c - Lipid Panel With LDL/HDL Ratio - TSH - Ambulatory referral to Gastroenterology - VITAMIN D 25 Hydroxy (Vit-D Deficiency, Fractures)  2. Essential hypertension Stable. Diagnosis pulled for medication refill. Continue current medical treatment plan. Will check labs as below and f/u pending results. - CBC with Differential/Platelet - Comprehensive metabolic panel - TSH - lisinopril (ZESTRIL) 5 MG tablet; Take 1 tablet (5 mg total) by mouth daily.  Dispense: 90 tablet; Refill:  3  3. Hyperlipidemia, unspecified hyperlipidemia type Stable on Pravastatin 40mg . Will check labs as below and f/u pending results. - Lipid Panel With LDL/HDL Ratio - TSH  4. Prediabetes Diet controlled. Will check labs as below and f/u pending results. - Hemoglobin A1c - TSH  5. Vitamin D insufficiency H/O this. On OTC supplementation. Will check labs as below and f/u pending results. - VITAMIN D 25 Hydroxy (Vit-D Deficiency, Fractures)  6. Colon cancer screening Due for repeat colonoscopy. Last was in 2012 with Dr. Tyson Babinski at [facility]. Overall normal with exception of diverticulosis. Referral placed to Dr. Alice Reichert at [facility] referral to Gastroenterology    No follow-ups on file.     Reynolds Bowl, PA-C, have reviewed all documentation for this visit. The  documentation on 06/03/20 for the exam, diagnosis, procedures, and orders are all accurate and complete.   Rubye Beach  [facility] [phone] (phone) [phone] (fax)  [place]

## 2020-06-03 NOTE — Patient Instructions (Signed)
Preventive Care 78 Years and Older, Male Preventive care refers to lifestyle choices and visits with your health care provider that can promote health and wellness. This includes:  A yearly physical exam. This is also called an annual wellness visit.  Regular dental and eye exams.  Immunizations.  Screening for certain conditions.  Healthy lifestyle choices, such as: ? Eating a healthy diet. ? Getting regular exercise. ? Not using drugs or products that contain nicotine and tobacco. ? Limiting alcohol use. What can I expect for my preventive care visit? Physical exam Your health care provider will check your:  Height and weight. These may be used to calculate your BMI (body mass index). BMI is a measurement that tells if you are at a healthy weight.  Heart rate and blood pressure.  Body temperature.  Skin for abnormal spots. Counseling Your health care provider may ask you questions about your:  Past medical problems.  Family's medical history.  Alcohol, tobacco, and drug use.  Emotional well-being.  Home life and relationship well-being.  Sexual activity.  Diet, exercise, and sleep habits.  History of falls.  Memory and ability to understand (cognition).  Work and work environment.  Access to firearms. What immunizations do I need? Vaccines are usually given at various ages, according to a schedule. Your health care provider will recommend vaccines for you based on your age, medical history, and lifestyle or other factors, such as travel or where you work.   What tests do I need? Blood tests  Lipid and cholesterol levels. These may be checked every 5 years, or more often depending on your overall health.  Hepatitis C test.  Hepatitis B test. Screening  Lung cancer screening. You may have this screening every year starting at age 55 if you have a 30-pack-year history of smoking and currently smoke or have quit within the past 15 years.  Colorectal  cancer screening. ? All adults should have this screening starting at age 50 and continuing until age 75. ? Your health care provider may recommend screening at age 45 if you are at increased risk. ? You will have tests every 1-10 years, depending on your results and the type of screening test.  Prostate cancer screening. Recommendations will vary depending on your family history and other risks.  Genital exam to check for testicular cancer or hernias.  Diabetes screening. ? This is done by checking your blood sugar (glucose) after you have not eaten for a while (fasting). ? You may have this done every 1-3 years.  Abdominal aortic aneurysm (AAA) screening. You may need this if you are a current or former smoker.  STD (sexually transmitted disease) testing, if you are at risk. Follow these instructions at home: Eating and drinking  Eat a diet that includes fresh fruits and vegetables, whole grains, lean protein, and low-fat dairy products. Limit your intake of foods with high amounts of sugar, saturated fats, and salt.  Take vitamin and mineral supplements as recommended by your health care provider.  Do not drink alcohol if your health care provider tells you not to drink.  If you drink alcohol: ? Limit how much you have to 0-2 drinks a day. ? Be aware of how much alcohol is in your drink. In the [place] one drink equals one 12 oz bottle of beer (355 mL), one 5 oz glass of wine (148 mL), or one 1 oz glass of hard liquor (44 mL).   Lifestyle  Take daily care of your teeth   and gums. Brush your teeth every morning and night with fluoride toothpaste. Floss one time each day.  Stay active. Exercise for at least 30 minutes 5 or more days each week.  Do not use any products that contain nicotine or tobacco, such as cigarettes, e-cigarettes, and chewing tobacco. If you need help quitting, ask your health care provider.  Do not use drugs.  If you are sexually active, practice safe sex.  Use a condom or other form of protection to prevent STIs (sexually transmitted infections).  Talk with your health care provider about taking a low-dose aspirin or statin.  Find healthy ways to cope with stress, such as: ? Meditation, yoga, or listening to music. ? Journaling. ? Talking to a trusted person. ? Spending time with friends and family. Safety  Always wear your seat belt while driving or riding in a vehicle.  Do not drive: ? If you have been drinking alcohol. Do not ride with someone who has been drinking. ? When you are tired or distracted. ? While texting.  Wear a helmet and other protective equipment during sports activities.  If you have firearms in your house, make sure you follow all gun safety procedures. What's next?  Visit your health care provider once a year for an annual wellness visit.  Ask your health care provider how often you should have your eyes and teeth checked.  Stay up to date on all vaccines. This information is not intended to replace advice given to you by your health care provider. Make sure you discuss any questions you have with your health care provider. Document Revised: 01/17/2019 Document Reviewed: 04/14/2018 Elsevier Patient Education  2021 Elsevier Inc.  

## 2020-06-04 LAB — COMPREHENSIVE METABOLIC PANEL
ALT: 25 IU/L (ref 0–44)
AST: 20 IU/L (ref 0–40)
Albumin/Globulin Ratio: 1.7 (ref 1.2–2.2)
Albumin: 4.3 g/dL (ref 3.7–4.7)
Alkaline Phosphatase: 58 IU/L (ref 44–121)
BUN/Creatinine Ratio: 22 (ref 10–24)
BUN: 26 mg/dL (ref 8–27)
Bilirubin Total: 0.4 mg/dL (ref 0.0–1.2)
CO2: 24 mmol/L (ref 20–29)
Calcium: 9.3 mg/dL (ref 8.6–10.2)
Chloride: 104 mmol/L (ref 96–106)
Creatinine, Ser: 1.17 mg/dL (ref 0.76–1.27)
GFR calc Af Amer: 69 mL/min/{1.73_m2} (ref >59)
GFR calc non Af Amer: 60 mL/min/{1.73_m2} (ref >59)
Globulin, Total: 2.6 g/dL (ref 1.5–4.5)
Glucose: 98 mg/dL (ref 65–99)
Potassium: 4.6 mmol/L (ref 3.5–5.2)
Sodium: 140 mmol/L (ref 134–144)
Total Protein: 6.9 g/dL (ref 6.0–8.5)

## 2020-06-04 LAB — LIPID PANEL WITH LDL/HDL RATIO
Cholesterol, Total: 133 mg/dL (ref 100–199)
HDL: 47 mg/dL (ref >39)
LDL Chol Calc (NIH): 75 mg/dL (ref 0–99)
LDL/HDL Ratio: 1.6 ratio (ref 0.0–3.6)
Triglycerides: 51 mg/dL (ref 0–149)
VLDL Cholesterol Cal: 11 mg/dL (ref 5–40)

## 2020-06-04 LAB — CBC WITH DIFFERENTIAL/PLATELET
Basophils Absolute: 0 10*3/uL (ref 0.0–0.2)
Basos: 1 %
EOS (ABSOLUTE): 0.1 10*3/uL (ref 0.0–0.4)
Eos: 2 %
Hematocrit: 45.1 % (ref 37.5–51.0)
Hemoglobin: 15.3 g/dL (ref 13.0–17.7)
Immature Grans (Abs): 0 10*3/uL (ref 0.0–0.1)
Immature Granulocytes: 0 %
Lymphocytes Absolute: 1.3 10*3/uL (ref 0.7–3.1)
Lymphs: 29 %
MCH: 30.6 pg (ref 26.6–33.0)
MCHC: 33.9 g/dL (ref 31.5–35.7)
MCV: 90 fL (ref 79–97)
Monocytes Absolute: 0.4 10*3/uL (ref 0.1–0.9)
Monocytes: 10 %
Neutrophils Absolute: 2.5 10*3/uL (ref 1.4–7.0)
Neutrophils: 58 %
Platelets: 132 10*3/uL — ABNORMAL LOW (ref 150–450)
RBC: 5 x10E6/uL (ref 4.14–5.80)
RDW: 12.7 % (ref 11.6–15.4)
WBC: 4.4 10*3/uL (ref 3.4–10.8)

## 2020-06-04 LAB — HEMOGLOBIN A1C
Est. average glucose Bld gHb Est-mCnc: 114 mg/dL
Hgb A1c MFr Bld: 5.6 % (ref 4.8–5.6)

## 2020-06-04 LAB — VITAMIN D 25 HYDROXY (VIT D DEFICIENCY, FRACTURES): Vit D, 25-Hydroxy: 41 ng/mL (ref 30.0–100.0)

## 2020-06-04 LAB — TSH: TSH: 3.21 u[IU]/mL (ref 0.450–4.500)

## 2020-06-10 ENCOUNTER — Other Ambulatory Visit: Payer: Self-pay

## 2020-06-10 ENCOUNTER — Telehealth (INDEPENDENT_AMBULATORY_CARE_PROVIDER_SITE_OTHER): Payer: Self-pay | Admitting: Gastroenterology

## 2020-06-10 DIAGNOSIS — Z1211 Encounter for screening for malignant neoplasm of colon: Secondary | ICD-10-CM

## 2020-06-10 MED ORDER — NA SULFATE-K SULFATE-MG SULF 17.5-3.13-1.6 GM/177ML PO SOLN: 1.0000 | Freq: Once | ORAL | 0 refills | Status: AC

## 2020-06-10 NOTE — Progress Notes (Signed)
Gastroenterology Pre-Procedure Review  Request Date: 06/25/20 Requesting Physician: Dr. Allen Norris  PATIENT REVIEW QUESTIONS: The patient responded to the following health history questions as indicated:    1. Are you having any GI issues? Diarrhea Saturday.  Pt stated it may have been something he ate that caused it.  Diarrhea has cleared up. 2. Do you have a personal history of Polyps? yes (last colonoscopy 2012) 3. Do you have a family history of Colon Cancer or Polyps? no 4. Diabetes Mellitus? no 5. Joint replacements in the past 12 months?no 6. Major health problems in the past 3 months?no 7. Any artificial heart valves, MVP, or defibrillator?no    MEDICATIONS & ALLERGIES:    Patient reports the following regarding taking any anticoagulation/antiplatelet therapy:   Plavix, Coumadin, Eliquis, Xarelto, Lovenox, Pradaxa, Brilinta, or Effient? no Aspirin? no  Patient confirms/reports the following medications:  Current Outpatient Medications  Medication Sig Dispense Refill  . Cholecalciferol (VITAMIN D3 PO) Take 1 capsule by mouth daily.    Marland Kitchen latanoprost (XALATAN) 0.005 % ophthalmic solution Place 1 drop into both eyes at bedtime.     Marland Kitchen lisinopril (ZESTRIL) 5 MG tablet Take 1 tablet (5 mg total) by mouth daily. 90 tablet 3  . Multiple Vitamins-Minerals (PRESERVISION AREDS) TABS Take by mouth.    . Polyvinyl Alcohol-Povidone PF 1.4-0.6 % SOLN Place 1 drop into both eyes 4 (four) times daily as needed (for irritation).     . pravastatin (PRAVACHOL) 40 MG tablet Take 1 tablet (40 mg total) by mouth daily. 90 tablet 3  . XIIDRA 5 % SOLN      No current facility-administered medications for this visit.    Patient confirms/reports the following allergies:  Allergies  Allergen Reactions  . Tape Rash and Other (See Comments)    Plastic     Orders Placed This Encounter  Procedures  . Procedural/ Surgical Case Request: COLONOSCOPY WITH PROPOFOL    Standing Status:   Standing    Number  of Occurrences:   1    Order Specific Question:   Pre-op diagnosis    Answer:   screening colonoscopy    Order Specific Question:   CPT Code    Answer:   63785    AUTHORIZATION INFORMATION Primary Insurance: 1D#: Group #:  Secondary Insurance: 1D#: Group #:  SCHEDULE INFORMATION: Date:  Time:Tuesday 06/25/20 [place]

## 2020-06-21 ENCOUNTER — Other Ambulatory Visit
Admission: RE | Admit: 2020-06-21 | Discharge: 2020-06-21 | Disposition: A | Discharge status: Home / Self Care | Payer: Medicare Other | Source: Ambulatory Visit | Attending: Gastroenterology | Admitting: Gastroenterology

## 2020-06-21 ENCOUNTER — Other Ambulatory Visit: Payer: Self-pay

## 2020-06-21 DIAGNOSIS — Z20822 Contact with and (suspected) exposure to covid-19: Secondary | ICD-10-CM | POA: Diagnosis not present

## 2020-06-21 DIAGNOSIS — Z01812 Encounter for preprocedural laboratory examination: Secondary | ICD-10-CM | POA: Insufficient documentation

## 2020-06-21 LAB — SARS CORONAVIRUS 2 (TAT 6-24 HRS): SARS Coronavirus 2: NEGATIVE

## 2020-06-25 ENCOUNTER — Ambulatory Visit
Admission: RE | Admit: 2020-06-25 | Discharge: 2020-06-25 | Disposition: A | Discharge status: Home / Self Care | Payer: Medicare Other | Attending: Gastroenterology | Admitting: Gastroenterology

## 2020-06-25 ENCOUNTER — Other Ambulatory Visit: Payer: Self-pay

## 2020-06-25 ENCOUNTER — Ambulatory Visit: Payer: Medicare Other | Admitting: Certified Registered"

## 2020-06-25 ENCOUNTER — Encounter: Payer: Self-pay | Admitting: Gastroenterology

## 2020-06-25 ENCOUNTER — Encounter
Admission: RE | Disposition: A | Discharge status: Home / Self Care | Payer: Self-pay | Source: Home / Self Care | Attending: Gastroenterology

## 2020-06-25 DIAGNOSIS — Z79899 Other long term (current) drug therapy: Secondary | ICD-10-CM | POA: Insufficient documentation

## 2020-06-25 DIAGNOSIS — K5730 Diverticulosis of large intestine without perforation or abscess without bleeding: Secondary | ICD-10-CM | POA: Insufficient documentation

## 2020-06-25 DIAGNOSIS — Z87891 Personal history of nicotine dependence: Secondary | ICD-10-CM | POA: Insufficient documentation

## 2020-06-25 DIAGNOSIS — D123 Benign neoplasm of transverse colon: Secondary | ICD-10-CM | POA: Diagnosis not present

## 2020-06-25 DIAGNOSIS — K641 Second degree hemorrhoids: Secondary | ICD-10-CM | POA: Diagnosis not present

## 2020-06-25 DIAGNOSIS — R7303 Prediabetes: Secondary | ICD-10-CM | POA: Insufficient documentation

## 2020-06-25 DIAGNOSIS — Z833 Family history of diabetes mellitus: Secondary | ICD-10-CM | POA: Diagnosis not present

## 2020-06-25 DIAGNOSIS — K635 Polyp of colon: Secondary | ICD-10-CM | POA: Diagnosis not present

## 2020-06-25 DIAGNOSIS — Z803 Family history of malignant neoplasm of breast: Secondary | ICD-10-CM | POA: Diagnosis not present

## 2020-06-25 DIAGNOSIS — Z820 Family history of epilepsy and other diseases of the nervous system: Secondary | ICD-10-CM | POA: Diagnosis not present

## 2020-06-25 DIAGNOSIS — Z91048 Other nonmedicinal substance allergy status: Secondary | ICD-10-CM | POA: Insufficient documentation

## 2020-06-25 DIAGNOSIS — Z8249 Family history of ischemic heart disease and other diseases of the circulatory system: Secondary | ICD-10-CM | POA: Diagnosis not present

## 2020-06-25 DIAGNOSIS — Z1211 Encounter for screening for malignant neoplasm of colon: Secondary | ICD-10-CM

## 2020-06-25 DIAGNOSIS — D126 Benign neoplasm of colon, unspecified: Secondary | ICD-10-CM | POA: Diagnosis not present

## 2020-06-25 HISTORY — PX: COLONOSCOPY WITH PROPOFOL: SHX5780

## 2020-06-25 SURGERY — COLONOSCOPY WITH PROPOFOL
Anesthesia: General

## 2020-06-25 MED ORDER — PROPOFOL 500 MG/50ML IV EMUL
INTRAVENOUS | Status: DC | PRN
  Administered 2020-06-25: 120 ug/kg/min via INTRAVENOUS

## 2020-06-25 MED ORDER — SODIUM CHLORIDE 0.9 % IV SOLN: INTRAVENOUS | Status: DC

## 2020-06-25 MED ORDER — LIDOCAINE 2% (20 MG/ML) 5 ML SYRINGE
INTRAMUSCULAR | Status: DC | PRN
  Administered 2020-06-25: 25 mg via INTRAVENOUS

## 2020-06-25 MED ORDER — PROPOFOL 10 MG/ML IV BOLUS
INTRAVENOUS | Status: DC | PRN
  Administered 2020-06-25: 70 mg via INTRAVENOUS
  Administered 2020-06-25: 30 mg via INTRAVENOUS

## 2020-06-25 NOTE — H&P (Signed)
Patrick Lame, MD [address] [place], [place] [address] Phone: [phone] Fax : [phone]  Primary Care Physician:  Patrick Daring, PA-C Primary Gastroenterologist:  Dr. Allen Good  Pre-Procedure History & Physical: HPI:  Patrick Good is a 80 y.o. male is here for a screening colonoscopy.   Past Medical History:  Diagnosis Date  . Arthritis    right index finger, left ankle  . Closed fracture of right tibial plafond with fibula involvement 10/06/2016  . 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 Fracture of tibial shaft, right, closed 10/08/2016  . GERD (gastroesophageal reflux disease)    20 years ago  . History of ankle fracture 1982   spiral break; left ankle  . History of chicken pox   . History of measles   . Hypertension   . Pre-diabetes   . Vitamin D insufficiency 10/12/2016    Past Surgical History:  Procedure Laterality Date  . CATARACT EXTRACTION W/PHACO Right 09/15/2017   Procedure: CATARACT EXTRACTION PHACO AND INTRAOCULAR LENS PLACEMENT ([place])  RIGHT TORIC;  Surgeon: Leandrew Koyanagi, MD;  Location: [facility];  Service: Ophthalmology;  Laterality: Right;  PT WANTS TO ARRIVE BETWEEN 9:30 AND 10  . CATARACT EXTRACTION W/PHACO Left 11/09/2017   Procedure: CATARACT EXTRACTION PHACO AND INTRAOCULAR LENS PLACEMENT ([place]) LEFT IVA TOPICAL;  Surgeon: Leandrew Koyanagi, MD;  Location: [facility];  Service: Ophthalmology;  Laterality: Left;  prefers arrival between 9 and 10  . HARDWARE REMOVAL Right 01/04/2018   Procedure: REMOVAL OF RIGHT TIBIAL HARDWARE;  Surgeon: Altamese Benkelman, MD;  Location: [facility];  Service: Orthopedics;  Laterality: Right;  . HERNIA REPAIR  1946 and 1952  . KNEE SURGERY  12/2011   Left meniscus by Dr. Wylene Simmer in [place]  . Myocardial Perfusion Scan  07/29/2011   Normal  . ORIF ANKLE FRACTURE Left 1982  . ORIF ANKLE FRACTURE Right 10/09/2016   Procedure: OPEN REDUCTION INTERNAL  FIXATION (ORIF) ANKLE FRACTURE;  Surgeon: Altamese [place], MD;  Location: [facility];  Service: Orthopedics;  Laterality: Right;  . TIBIA IM NAIL INSERTION Right 10/09/2016   Procedure: INTRAMEDULLARY (IM) NAIL TIBIAL;  Surgeon: Altamese Inman Mills, MD;  Location: [facility];  Service: Orthopedics;  Laterality: Right;  Marland Kitchen VASECTOMY  1972    Prior to Admission medications   Medication Sig Start Date End Date Taking? Authorizing Provider  Cholecalciferol (VITAMIN D3 PO) Take 1 capsule by mouth daily.   Yes [provider]  latanoprost (XALATAN) 0.005 % ophthalmic solution Place 1 drop into both eyes at bedtime.    Yes [provider]  pravastatin (PRAVACHOL) 40 MG tablet Take 1 tablet (40 mg total) by mouth daily. 06/03/20  Yes Patrick Daring, PA-C  lisinopril (ZESTRIL) 5 MG tablet Take 1 tablet (5 mg total) by mouth daily. 06/03/20   Patrick Daring, PA-C  Multiple Vitamins-Minerals (PRESERVISION AREDS) TABS Take by mouth.    [provider]  Polyvinyl Alcohol-Povidone PF 1.4-0.6 % SOLN Place 1 drop into both eyes 4 (four) times daily as needed (for irritation).     [provider]  XIIDRA 5 % SOLN  01/22/20   [provider]    Allergies as of 06/10/2020 - Review Complete 06/10/2020  Allergen Reaction Noted  . Tape Rash and Other (See Comments) 09/15/2017    Family History  Problem Relation Age of Onset  . Heart Problems Sister   . Diabetes type II Sister   .  Breast cancer Sister   . Multiple sclerosis Sister     Social History   Socioeconomic History  . Marital status: Married    Spouse name: Not on file  . Number of children: 2  . Years of education: Not on file  . Highest education level: Not on file  Occupational History  . Occupation: Retired    Comment: Former Doctor, hospital working part time in [employer]  Tobacco Use  . Smoking status: Former Smoker    Packs/day: 0.50    Years: 25.00    Pack years: 12.50    Types:  Cigarettes    Quit date: 05/04/1992    Years since quitting: 28.1  . Smokeless tobacco: Never Used  Vaping Use  . Vaping Use: Never used  Substance and Sexual Activity  . Alcohol use: No    Alcohol/week: 0.0 standard drinks  . Drug use: No  . Sexual activity: Yes    Birth control/protection: Surgical  Other Topics Concern  . Not on file  Social History Narrative  . Not on file   Social Determinants of Health   Financial Resource Strain: Not on file  Food Insecurity: Not on file  Transportation Needs: Not on file  Physical Activity: Not on file  Stress: Not on file  Social Connections: Not on file  Intimate Partner Violence: Not on file    Review of Systems: See HPI, otherwise negative ROS  Physical Exam: BP (!) 146/87   Pulse 70   Temp (!) 97.5 F (36.4 C)   Resp 18   Ht 6' 3.5" (1.918 m)   Wt 87.1 kg   SpO2 100%   BMI 23.68 kg/m  General:   Alert,  pleasant and cooperative in NAD Head:  Normocephalic and atraumatic. Neck:  Supple; no masses or thyromegaly. Lungs:  Clear throughout to auscultation.    Heart:  Regular rate and rhythm. Abdomen:  Soft, nontender and nondistended. Normal bowel sounds, without guarding, and without rebound.   Neurologic:  Alert and  oriented x4;  grossly normal neurologically.  Impression/Plan: Patrick Good is now here to undergo a screening colonoscopy.  Risks, benefits, and alternatives regarding colonoscopy have been reviewed with the patient.  Questions have been answered.  All parties agreeable.

## 2020-06-25 NOTE — Anesthesia Postprocedure Evaluation (Signed)
Anesthesia Post Note  Patient: Patrick Good  Procedure(s) Performed: COLONOSCOPY WITH PROPOFOL (N/A )  Patient location during evaluation: Phase II Anesthesia Type: General Level of consciousness: awake and alert, awake and oriented Pain management: pain level controlled Vital Signs Assessment: post-procedure vital signs reviewed and stable Respiratory status: spontaneous breathing, nonlabored ventilation and respiratory function stable Cardiovascular status: blood pressure returned to baseline and stable Postop Assessment: no apparent nausea or vomiting Anesthetic complications: no   No complications documented.   Last Vitals:  Vitals:   06/25/20 1020 06/25/20 1030  BP: 113/71 121/68  Pulse: 61 69  Resp: 13 12  Temp:    SpO2: 100% 100%    Last Pain:  Vitals:   06/25/20 0902  PainSc: 0-No pain                 Phill Mutter

## 2020-06-25 NOTE — Anesthesia Preprocedure Evaluation (Signed)
Anesthesia Evaluation  Patient identified by MRN, date of birth, ID band Patient awake    Reviewed: Allergy & Precautions, NPO status , Patient's Chart, lab work & pertinent test results  History of Anesthesia Complications Negative for: history of anesthetic complications  Airway Mallampati: II  TM Distance: >3 FB Neck ROM: Full    Dental no notable dental hx. (+) Teeth Intact,    Pulmonary former smoker,    Pulmonary exam normal breath sounds clear to auscultation       Cardiovascular hypertension, Pt. on medications Normal cardiovascular exam Rhythm:Regular     Neuro/Psych negative neurological ROS  negative psych ROS   GI/Hepatic Neg liver ROS, Bowel prep,GERD  ,  Endo/Other  negative endocrine ROS  Renal/GU negative Renal ROS     Musculoskeletal  (+) Arthritis ,   Abdominal   Peds  Hematology negative hematology ROS (+)   Anesthesia Other Findings Essential hypertension Hyperlipidemia, unspecified hyperlipidemia type Prediabetes Vitamin D insufficiency Colon cancer screening    Left ventricle: The cavity size was normal. Wall thickness was   increased in a pattern of mild LVH. Systolic function was normal.   The estimated ejection fraction was in the range of 55% to 60%.   Wall motion was normal; there were no regional wall motion   abnormalities. Doppler parameters are consistent with abnormal   left ventricular relaxation (grade 1 diastolic dysfunction). - Aortic valve: There was no stenosis. - Mitral valve: There was no significant regurgitation. - Right ventricle: The cavity size was normal. Systolic function   was normal. - Pulmonary arteries: No complete TR doppler jet so unable to   estimate PA systolic pressure. - Inferior vena cava: The vessel was normal in size. The   respirophasic diameter changes were in the normal range (>= 50%),   consistent with normal central venous pressure.    Reproductive/Obstetrics                             Anesthesia Physical  Anesthesia Plan  ASA: II  Anesthesia Plan: General   Post-op Pain Management:    Induction: Intravenous  PONV Risk Score and Plan: 2 and Propofol infusion and TIVA  Airway Management Planned: Natural Airway and Nasal Cannula  Additional Equipment: None  Intra-op Plan:   Post-operative Plan: Extubation in OR  Informed Consent: I have reviewed the patients History and Physical, chart, labs and discussed the procedure including the risks, benefits and alternatives for the proposed anesthesia with the patient or authorized representative who has indicated his/her understanding and acceptance.     Dental advisory given  Plan Discussed with: CRNA, Surgeon and Anesthesiologist  Anesthesia Plan Comments:         Anesthesia Quick Evaluation

## 2020-06-25 NOTE — Transfer of Care (Signed)
Immediate Anesthesia Transfer of Care Note  Patient: Patrick Good  Procedure(s) Performed: COLONOSCOPY WITH PROPOFOL (N/A )  Patient Location: Endoscopy Unit  Anesthesia Type:General  Level of Consciousness: drowsy  Airway & Oxygen Therapy: Patient Spontanous Breathing  Post-op Assessment: Report given to RN and Post -op Vital signs reviewed and stable  Post vital signs: Reviewed  Last Vitals:  Vitals Value Taken Time  BP    Temp    Pulse    Resp    SpO2      Last Pain:  Vitals:   06/25/20 0902  PainSc: 0-No pain         Complications: No complications documented.

## 2020-06-25 NOTE — Op Note (Signed)
[facility] Patient Name: Patrick Good Procedure Date: 06/25/2020 9:25 AM MRN: [id number] Account #: [id number] Date of Birth: 05/19/1940 Admit Type: Outpatient Age: 80 Room: [facility] ENDO ROOM 4 Gender: Male Note Status: Finalized Procedure:             Colonoscopy Indications:           Screening for colorectal malignant neoplasm Providers:             [name] MD, MD Referring MD:          Mar Daring (Referring MD) Medicines:             Propofol per Anesthesia Complications:         No immediate complications. Procedure:             Pre-Anesthesia Assessment:                        - Prior to the procedure, a History and Physical was                         performed, and patient medications and allergies were                         reviewed. The patient's tolerance of previous                         anesthesia was also reviewed. The risks and benefits                         of the procedure and the sedation options and risks                         were discussed with the patient. All questions were                         answered, and informed consent was obtained. Prior                         Anticoagulants: The patient has taken no previous                         anticoagulant or antiplatelet agents. ASA Grade                         Assessment: II - A patient with mild systemic disease.                         After reviewing the risks and benefits, the patient                         was deemed in satisfactory condition to undergo the                         procedure.                        After obtaining informed consent, the colonoscope was  passed under direct vision. Throughout the procedure,                         the patient's blood pressure, pulse, and oxygen                         saturations were monitored continuously. The                         Colonoscope was introduced through  the anus and                         advanced to the the cecum, identified by appendiceal                         orifice and ileocecal valve. The colonoscopy was                         performed without difficulty. The patient tolerated                         the procedure well. The quality of the bowel                         preparation was excellent. Findings:      The perianal and digital rectal examinations were normal.      A 3 mm polyp was found in the sigmoid colon. The polyp was sessile. The       polyp was removed with a cold snare. Resection and retrieval were       complete.      Two sessile polyps were found in the transverse colon. The polyps were 2       to 3 mm in size. These polyps were removed with a cold snare. Resection       and retrieval were complete.      Multiple small-mouthed diverticula were found in the sigmoid colon.      Non-bleeding internal hemorrhoids were found during retroflexion. The       hemorrhoids were Grade II (internal hemorrhoids that prolapse but reduce       spontaneously). Impression:            - One 3 mm polyp in the sigmoid colon, removed with a                         cold snare. Resected and retrieved.                        - Two 2 to 3 mm polyps in the transverse colon,                         removed with a cold snare. Resected and retrieved.                        - Diverticulosis in the sigmoid colon.                        - Non-bleeding internal hemorrhoids. Recommendation:        - Discharge patient to home.                        -  Resume previous diet.                        - Continue present medications.                        - Await pathology results.                        - Repeat colonoscopy is not recommended due to current                         age (15 years or older) for screening purposes. Procedure Code(s):     --- Professional ---                        [phone], Colonoscopy, flexible; with removal of                          tumor(s), polyp(s), or other lesion(s) by snare                         technique Diagnosis Code(s):     --- Professional ---                        Z12.11, Encounter for screening for malignant neoplasm                         of colon                        K63.5, Polyp of colon CPT copyright 2019 American Medical Association. All rights reserved. The codes documented in this report are preliminary and upon coder review may  be revised to meet current compliance requirements. [name] MD, MD 06/25/2020 9:53:03 AM This report has been signed electronically. Number of Addenda: 0 Note Initiated On: 06/25/2020 9:25 AM Scope Withdrawal Time: 0 hours 10 minutes 19 seconds  Total Procedure Duration: 0 hours 16 minutes 30 seconds  Estimated Blood Loss:  Estimated blood loss: none.      [facility]

## 2020-06-26 ENCOUNTER — Encounter: Payer: Self-pay | Admitting: Gastroenterology

## 2020-06-26 LAB — SURGICAL PATHOLOGY

## 2020-06-30 ENCOUNTER — Encounter: Payer: Self-pay | Admitting: Gastroenterology

## 2020-07-22 DIAGNOSIS — H401131 Primary open-angle glaucoma, bilateral, mild stage: Secondary | ICD-10-CM | POA: Diagnosis not present

## 2020-08-01 DIAGNOSIS — H40003 Preglaucoma, unspecified, bilateral: Secondary | ICD-10-CM | POA: Diagnosis not present

## 2020-09-02 ENCOUNTER — Telehealth: Payer: Self-pay

## 2020-09-02 NOTE — Telephone Encounter (Signed)
Copied from [place] [id number]. Topic: Appointment Scheduling - Scheduling Inquiry for Clinic >> Aug 30, 2020 12:20 PM Tessa Lerner A wrote: Reason for CRM: Patient has an appt set for 06/05/21 for their physical  Patient would like to be seen by [place]. Chrismon for that appointment if possible  Appointment was not changed at the time of call with agent  Please contact to further advise scheduling when possible  Patient('s wife) would like to be contacted for confirmation if the appointment is able to be modified

## 2021-01-28 DIAGNOSIS — H40003 Preglaucoma, unspecified, bilateral: Secondary | ICD-10-CM | POA: Diagnosis not present

## 2021-02-06 DIAGNOSIS — Z23 Encounter for immunization: Secondary | ICD-10-CM | POA: Diagnosis not present

## 2021-05-06 DIAGNOSIS — I87392 Chronic venous hypertension (idiopathic) with other complications of left lower extremity: Secondary | ICD-10-CM | POA: Diagnosis not present

## 2021-05-06 DIAGNOSIS — I872 Venous insufficiency (chronic) (peripheral): Secondary | ICD-10-CM | POA: Diagnosis not present

## 2021-05-06 DIAGNOSIS — M7989 Other specified soft tissue disorders: Secondary | ICD-10-CM | POA: Diagnosis not present

## 2021-05-06 DIAGNOSIS — I87393 Chronic venous hypertension (idiopathic) with other complications of bilateral lower extremity: Secondary | ICD-10-CM | POA: Diagnosis not present

## 2021-05-13 ENCOUNTER — Telehealth: Payer: Self-pay | Admitting: Physician Assistant

## 2021-05-13 NOTE — Telephone Encounter (Signed)
Patient's wife called in says needs to reschedul morning appt for AWV on 02/02.

## 2021-05-13 NOTE — Telephone Encounter (Signed)
Copied from [place] [phone]. Topic: Appointment Scheduling - Scheduling Inquiry for Clinic >> May 13, 2021 12:36 PM [place], Brayton Layman A wrote: Reason for CRM: Pts wife called in to change the pts upcoming AWV appt on 2/2, caller requested a Morning appt if possible, please advise.

## 2021-05-14 ENCOUNTER — Other Ambulatory Visit: Payer: Self-pay | Admitting: Physician Assistant

## 2021-05-14 DIAGNOSIS — I10 Essential (primary) hypertension: Secondary | ICD-10-CM

## 2021-06-04 NOTE — Progress Notes (Signed)
Annual Wellness Visit     Patient: Patrick Good, Male    DOB: August 10, 1940, 81 y.o.   MRN: [id number] Visit Date: 06/05/2021  Today's Provider: Mikey Kirschner, PA-C   Cc. Awv, cpe  Subjective    Patrick Good is a 81 y.o. male who presents today for his Annual Wellness Visit. He reports consuming a  low carb  diet. The patient does not participate in regular exercise at present. He generally feels well. He reports sleeping fairly well. He does have additional problems to discuss today.   HPI  Patient has concern over right index finger knuckle causing pain. Denies pain, reports occasionally will product a clear fluid. Denies erythema, rash. Hx of arthritis w/ nodule that derm has previously removed for him. Does not feel he needs to see a hand specialist yet.  Medications: Outpatient Medications Prior to Visit  Medication Sig   Cholecalciferol (VITAMIN D3 PO) Take 1 capsule by mouth daily.   latanoprost (XALATAN) 0.005 % ophthalmic solution Place 1 drop into both eyes at bedtime.    Multiple Vitamins-Minerals (PRESERVISION AREDS) TABS Take by mouth.   Polyvinyl Alcohol-Povidone PF 1.4-0.6 % SOLN Place 1 drop into both eyes 4 (four) times daily as needed (for irritation).    [DISCONTINUED] lisinopril (ZESTRIL) 5 MG tablet TAKE 1 TABLET DAILY   [DISCONTINUED] pravastatin (PRAVACHOL) 40 MG tablet Take 1 tablet (40 mg total) by mouth daily.   [DISCONTINUED] XIIDRA 5 % SOLN    No facility-administered medications prior to visit.    Allergies  Allergen Reactions   Tape Rash and Other (See Comments)    Plastic     Patient Care Team: Mikey Kirschner, PA-C as PCP - General (Physician Assistant) Leandrew Koyanagi, MD as Referring Physician (Ophthalmology)  Review of Systems  Constitutional: Negative.   HENT: Negative.    Eyes: Negative.   Respiratory: Negative.    Cardiovascular: Negative.   Gastrointestinal: Negative.   Endocrine: Negative.   Genitourinary:  Negative.   Musculoskeletal:  Positive for neck stiffness.  Skin: Negative.   Allergic/Immunologic: Negative.   Neurological: Negative.   Hematological: Negative.   Psychiatric/Behavioral: Negative.          Objective    Vitals: BP 116/70 (BP Location: Left Arm, Patient Position: Sitting, Cuff Size: Normal)    Pulse 78    Temp 97.7 F (36.5 C) (Oral)    Ht 6' 3.5" (1.918 m)    Wt 188 lb (85.3 kg)    SpO2 96%    BMI 23.19 kg/m    Physical Exam Constitutional:      Appearance: Normal appearance. He is normal weight.  HENT:     Head: Normocephalic and atraumatic.     Right Ear: Tympanic membrane, ear canal and external ear normal.     Left Ear: Tympanic membrane, ear canal and external ear normal.     Nose: Nose normal.     Mouth/Throat:     Mouth: Mucous membranes are moist.     Pharynx: Oropharynx is clear.  Eyes:     Extraocular Movements: Extraocular movements intact.     Conjunctiva/sclera: Conjunctivae normal.     Pupils: Pupils are equal, round, and reactive to light.  Cardiovascular:     Rate and Rhythm: Normal rate and regular rhythm.     Pulses: Normal pulses.     Heart sounds: Normal heart sounds.  Pulmonary:     Effort: Pulmonary effort is normal.     Breath sounds:  Normal breath sounds.  Abdominal:     General: Abdomen is flat. Bowel sounds are normal.     Palpations: Abdomen is soft.  Musculoskeletal:        General: Normal range of motion.     Cervical back: Normal range of motion and neck supple.  Skin:    General: Skin is warm and dry.  Neurological:     General: No focal deficit present.     Mental Status: He is alert and oriented to person, place, and time. Mental status is at baseline.  Psychiatric:        Mood and Affect: Mood normal.        Behavior: Behavior normal.        Thought Content: Thought content normal.        Judgment: Judgment normal.    Most recent functional status assessment: In your present state of health, do you have any  difficulty performing the following activities: 06/05/2021  Hearing? N  Vision? N  Difficulty concentrating or making decisions? N  Walking or climbing stairs? N  Dressing or bathing? N  Doing errands, shopping? N  Preparing Food and eating ? -  Using the Toilet? -  In the past six months, have you accidently leaked urine? -  Do you have problems with loss of bowel control? -  Managing your Medications? -  Managing your Finances? -  Housekeeping or managing your Housekeeping? -  Some recent data might be hidden   Most recent fall risk assessment: Fall Risk  06/03/2021  Falls in the past year? 0  Number falls in past yr: -  Injury with Fall? -  Risk for fall due to : -  Follow up -    Most recent depression screenings: PHQ 2/9 Scores 06/05/2021 06/03/2020  PHQ - 2 Score 0 0  PHQ- 9 Score 1 1   Most recent cognitive screening: 6CIT Screen 06/03/2020  What Year? 0 points  What month? 0 points  What time? 0 points  Count back from 20 0 points  Months in reverse 0 points  Repeat phrase 0 points  Total Score 0   Most recent Audit-C alcohol use screening Alcohol Use Disorder Test (AUDIT) 06/03/2021  1. How often do you have a drink containing alcohol? 0  2. How many drinks containing alcohol do you have on a typical day when you are drinking? -  3. How often do you have six or more drinks on one occasion? 0  AUDIT-C Score -  Alcohol Brief Interventions/Follow-up -   A score of 3 or more in women, and 4 or more in men indicates increased risk for alcohol abuse, EXCEPT if all of the points are from question 1   No results found for any visits on 06/05/21.  Assessment & Plan     Annual wellness visit done today including the all of the following: Reviewed patient's Family Medical History Reviewed and updated list of patient's medical providers Assessment of cognitive impairment was done Assessed patient's functional ability Established a written schedule for health screening  [place] Completed and Reviewed   Immunization History  Administered Date(s) Administered   Fluad Quad(high Dose 65+) 01/25/2019   Influenza, High Dose Seasonal PF 01/23/2015   Influenza, Seasonal, Injecte, Preservative Fre 02/14/2016   Influenza-Unspecified 02/04/2017, 02/09/2018, 02/06/2021   Moderna Sars-Covid-2 Vaccination 06/03/2019, 07/03/2019   PFIZER(Purple Top)SARS-COV-2 Vaccination 02/27/2020   Pneumococcal Conjugate-13 05/08/2014   Pneumococcal Polysaccharide-23 03/22/2009   Tdap 09/13/2007, 02/23/2018  Zoster, Live 06/02/2005    Health Maintenance  Topic Date Due   Hepatitis C Screening  Never done   Zoster Vaccines- Shingrix (1 of 2) Never done   COVID-19 Vaccine (4 - Booster for Moderna series) 04/23/2020   TETANUS/TDAP  02/24/2028   Pneumonia Vaccine 75+ Years old  Completed   INFLUENZA VACCINE  Completed   HPV VACCINES  Aged Out   COLONOSCOPY (Pts 45-1yrs Insurance coverage will need to be confirmed)  Discontinued     Discussed health benefits of physical activity, and encouraged him to engage in regular exercise appropriate for his age and condition.    Problem List Items Addressed This Visit       Other   Hyperlipidemia   Relevant Medications   lisinopril (ZESTRIL) 5 MG tablet   pravastatin (PRAVACHOL) 40 MG tablet   Other Relevant Orders   Comprehensive Metabolic Panel (CMET)   Lipid Profile   Nodule of finger of right hand    Per pt related to arthritis and treated to derm previously. Unsure if he needs a repeat referral.      Relevant Orders   Ambulatory referral to Dermatology   Other Visit Diagnoses     Encounter for annual wellness exam in Medicare patient    -  Primary   Relevant Orders   CBC w/Diff/Platelet   HgB A1c   PSA   Encounter for physical examination       Relevant Orders   CBC w/Diff/Platelet   HgB A1c   PSA   Essential hypertension       Relevant Medications   lisinopril (ZESTRIL) 5 MG tablet    pravastatin (PRAVACHOL) 40 MG tablet   Other Relevant Orders   Comprehensive Metabolic Panel (CMET)   Encounter for hepatitis C screening test for low risk patient       Relevant Orders   Hepatitis C antibody   Hyperglycemia       Relevant Orders   HgB A1c   Thrombocytopenia (HCC)       Relevant Orders   CBC w/Diff/Platelet        Return in about 1 year (around 06/05/2022) for CPE.     [place] P[place],acting as a scribe for Mikey Kirschner, PA-C.,have documented all relevant documentation on the behalf of Mikey Kirschner, PA-C,as directed by  Mikey Kirschner, PA-C while in the presence of Mikey Kirschner, PA-C.   I, Mikey Kirschner, PA-C have reviewed all documentation for this visit. The documentation on  06/05/2021  for the exam, diagnosis, procedures, and orders are all accurate and complete.    Mikey Kirschner, PA-C  [facility] [phone] (phone) [phone] (fax)  [place]

## 2021-06-05 ENCOUNTER — Other Ambulatory Visit: Payer: Self-pay

## 2021-06-05 ENCOUNTER — Encounter: Payer: Medicare Other | Admitting: Physician Assistant

## 2021-06-05 ENCOUNTER — Encounter: Payer: Self-pay | Admitting: Physician Assistant

## 2021-06-05 ENCOUNTER — Ambulatory Visit: Payer: Self-pay | Admitting: Family Medicine

## 2021-06-05 ENCOUNTER — Encounter: Payer: Self-pay | Admitting: Family Medicine

## 2021-06-05 ENCOUNTER — Ambulatory Visit (INDEPENDENT_AMBULATORY_CARE_PROVIDER_SITE_OTHER): Payer: Medicare Other | Admitting: Physician Assistant

## 2021-06-05 VITALS — BP 116/70 | HR 78 | Temp 97.7°F | Ht 75.5 in | Wt 188.0 lb

## 2021-06-05 DIAGNOSIS — Z0000 Encounter for general adult medical examination without abnormal findings: Secondary | ICD-10-CM | POA: Diagnosis not present

## 2021-06-05 DIAGNOSIS — I10 Essential (primary) hypertension: Secondary | ICD-10-CM | POA: Diagnosis not present

## 2021-06-05 DIAGNOSIS — D696 Thrombocytopenia, unspecified: Secondary | ICD-10-CM

## 2021-06-05 DIAGNOSIS — Z1159 Encounter for screening for other viral diseases: Secondary | ICD-10-CM | POA: Diagnosis not present

## 2021-06-05 DIAGNOSIS — R739 Hyperglycemia, unspecified: Secondary | ICD-10-CM | POA: Diagnosis not present

## 2021-06-05 DIAGNOSIS — R2231 Localized swelling, mass and lump, right upper limb: Secondary | ICD-10-CM | POA: Insufficient documentation

## 2021-06-05 DIAGNOSIS — E785 Hyperlipidemia, unspecified: Secondary | ICD-10-CM

## 2021-06-05 MED ORDER — PRAVASTATIN SODIUM 40 MG PO TABS
40.0000 mg | ORAL_TABLET | Freq: Every day | ORAL | 3 refills | Status: DC
Start: 2021-06-05 — End: 2022-08-18

## 2021-06-05 MED ORDER — LISINOPRIL 5 MG PO TABS
5.0000 mg | ORAL_TABLET | Freq: Every day | ORAL | 3 refills | Status: DC
Start: 2021-06-05 — End: 2022-06-01

## 2021-06-05 NOTE — Assessment & Plan Note (Signed)
Per pt related to arthritis and treated to derm previously. Unsure if he needs a repeat referral.

## 2021-06-06 LAB — CBC WITH DIFFERENTIAL/PLATELET
Basophils Absolute: 0 10*3/uL (ref 0.0–0.2)
Basos: 1 %
EOS (ABSOLUTE): 0.2 10*3/uL (ref 0.0–0.4)
Eos: 4 %
Hematocrit: 45.3 % (ref 37.5–51.0)
Hemoglobin: 15.1 g/dL (ref 13.0–17.7)
Immature Grans (Abs): 0 10*3/uL (ref 0.0–0.1)
Immature Granulocytes: 0 %
Lymphocytes Absolute: 1.6 10*3/uL (ref 0.7–3.1)
Lymphs: 26 %
MCH: 30.4 pg (ref 26.6–33.0)
MCHC: 33.3 g/dL (ref 31.5–35.7)
MCV: 91 fL (ref 79–97)
Monocytes Absolute: 0.6 10*3/uL (ref 0.1–0.9)
Monocytes: 9 %
Neutrophils Absolute: 3.8 10*3/uL (ref 1.4–7.0)
Neutrophils: 60 %
Platelets: 161 10*3/uL (ref 150–450)
RBC: 4.96 x10E6/uL (ref 4.14–5.80)
RDW: 13.1 % (ref 11.6–15.4)
WBC: 6.3 10*3/uL (ref 3.4–10.8)

## 2021-06-06 LAB — COMPREHENSIVE METABOLIC PANEL
ALT: 25 IU/L (ref 0–44)
AST: 25 IU/L (ref 0–40)
Albumin/Globulin Ratio: 2 (ref 1.2–2.2)
Albumin: 4.7 g/dL (ref 3.7–4.7)
Alkaline Phosphatase: 72 IU/L (ref 44–121)
BUN/Creatinine Ratio: 22 (ref 10–24)
BUN: 24 mg/dL (ref 8–27)
Bilirubin Total: 0.7 mg/dL (ref 0.0–1.2)
CO2: 25 mmol/L (ref 20–29)
Calcium: 9.8 mg/dL (ref 8.6–10.2)
Chloride: 104 mmol/L (ref 96–106)
Creatinine, Ser: 1.07 mg/dL (ref 0.76–1.27)
Globulin, Total: 2.3 g/dL (ref 1.5–4.5)
Glucose: 99 mg/dL (ref 70–99)
Potassium: 4.5 mmol/L (ref 3.5–5.2)
Sodium: 141 mmol/L (ref 134–144)
Total Protein: 7 g/dL (ref 6.0–8.5)
eGFR: 71 mL/min/{1.73_m2} (ref >59)

## 2021-06-06 LAB — HEMOGLOBIN A1C
Est. average glucose Bld gHb Est-mCnc: 117 mg/dL
Hgb A1c MFr Bld: 5.7 % — ABNORMAL HIGH (ref 4.8–5.6)

## 2021-06-06 LAB — PSA: Prostate Specific Ag, Serum: 2.1 ng/mL (ref 0.0–4.0)

## 2021-06-06 LAB — LIPID PANEL
Chol/HDL Ratio: 3.1 ratio (ref 0.0–5.0)
Cholesterol, Total: 141 mg/dL (ref 100–199)
HDL: 45 mg/dL (ref >39)
LDL Chol Calc (NIH): 79 mg/dL (ref 0–99)
Triglycerides: 88 mg/dL (ref 0–149)
VLDL Cholesterol Cal: 17 mg/dL (ref 5–40)

## 2021-06-06 LAB — HEPATITIS C ANTIBODY: Hep C Virus Ab: <0.1 s/co ratio (ref 0.0–0.9)

## 2021-06-17 DIAGNOSIS — M7130 Other bursal cyst, unspecified site: Secondary | ICD-10-CM | POA: Diagnosis not present

## 2021-06-18 DIAGNOSIS — I872 Venous insufficiency (chronic) (peripheral): Secondary | ICD-10-CM | POA: Diagnosis not present

## 2021-06-18 DIAGNOSIS — I87392 Chronic venous hypertension (idiopathic) with other complications of left lower extremity: Secondary | ICD-10-CM | POA: Diagnosis not present

## 2021-07-29 DIAGNOSIS — H40003 Preglaucoma, unspecified, bilateral: Secondary | ICD-10-CM | POA: Diagnosis not present

## 2021-08-11 ENCOUNTER — Telehealth: Payer: Self-pay

## 2021-08-11 NOTE — Telephone Encounter (Signed)
Patrick Good, pt spouse called and stated that pt received a bill from [place] for lab work completed 06/05/21. Pt spouse states that she spoke with [facility] and they states it is a possible dx code issue. Pt would like call back from [facility]. ?

## 2022-01-28 DIAGNOSIS — H401131 Primary open-angle glaucoma, bilateral, mild stage: Secondary | ICD-10-CM | POA: Diagnosis not present

## 2022-02-26 ENCOUNTER — Encounter: Payer: Self-pay | Admitting: Physician Assistant

## 2022-04-22 ENCOUNTER — Encounter: Payer: Self-pay | Admitting: Physician Assistant

## 2022-06-01 ENCOUNTER — Other Ambulatory Visit: Payer: Self-pay | Admitting: Physician Assistant

## 2022-06-01 DIAGNOSIS — I10 Essential (primary) hypertension: Secondary | ICD-10-CM

## 2022-06-09 ENCOUNTER — Ambulatory Visit (INDEPENDENT_AMBULATORY_CARE_PROVIDER_SITE_OTHER): Payer: Medicare Other | Admitting: Physician Assistant

## 2022-06-09 ENCOUNTER — Encounter: Payer: Self-pay | Admitting: Physician Assistant

## 2022-06-09 VITALS — BP 110/70 | HR 63 | Temp 98.4°F | Ht 75.5 in | Wt 190.5 lb

## 2022-06-09 DIAGNOSIS — Z0000 Encounter for general adult medical examination without abnormal findings: Secondary | ICD-10-CM

## 2022-06-09 DIAGNOSIS — E785 Hyperlipidemia, unspecified: Secondary | ICD-10-CM

## 2022-06-09 DIAGNOSIS — D696 Thrombocytopenia, unspecified: Secondary | ICD-10-CM | POA: Insufficient documentation

## 2022-06-09 DIAGNOSIS — R7303 Prediabetes: Secondary | ICD-10-CM | POA: Diagnosis not present

## 2022-06-09 DIAGNOSIS — I10 Essential (primary) hypertension: Secondary | ICD-10-CM

## 2022-06-09 NOTE — Assessment & Plan Note (Signed)
Chronic, well controlled Managed with lisinopril 5 mg  Ordered cmp F/u annually

## 2022-06-09 NOTE — Assessment & Plan Note (Signed)
Controlled, managed with pravastatin 40 mg  Repeat fasting lipids/cmp

## 2022-06-09 NOTE — Assessment & Plan Note (Addendum)
Historically, pt also experiencing potential hypoglycemic episodes Advised he check his BS during these episodes at home; if BS normal consider carotid US Repeat a1c

## 2022-06-09 NOTE — Addendum Note (Signed)
Addended byMikey Kirschner on: 06/09/2022 10:36 AM   Modules accepted: Level of Service

## 2022-06-09 NOTE — Progress Notes (Addendum)
I,Sha'taria Tyson,acting as a Education administrator for [employer], PA-C.,have documented all relevant documentation on the behalf of Mikey Kirschner, PA-C,as directed by  Mikey Kirschner, PA-C while in the presence of Mikey Kirschner, PA-C.   Annual Wellness Visit     Patient: Patrick Good, Male    DOB: June 08, 1940, 82 y.o.   MRN: [id number] Visit Date: 06/09/2022  Today's Provider: Mikey Kirschner, PA-C   Cc. awv  Subjective    Patrick Good is a 82 y.o. male who presents today for his Annual Wellness Visit. He reports consuming a general diet.  The patient reports walking on occassions for at least 15 minutes.  He generally feels well. He reports sleeping well. He does have additional problems to discuss today.   HPI Pt reports his wife tested positive for flu this week but he denies all symptoms  Pt reports intermittent episode of left eye blurriness for a few years, now more frequent. Reports always resolved in a few minutes after eating something sweet. Denies checking blood sugar.  Denies associated dizziness, headaches.  Medications: Outpatient Medications Prior to Visit  Medication Sig   Cholecalciferol (VITAMIN D3 PO) Take 1 capsule by mouth daily.   latanoprost (XALATAN) 0.005 % ophthalmic solution Place 1 drop into both eyes at bedtime.    lisinopril (ZESTRIL) 5 MG tablet TAKE 1 TABLET DAILY (APPOINTMENT IS NEEDED FOR FURTHER REFILLS)   Multiple Vitamins-Minerals (PRESERVISION AREDS) TABS Take by mouth.   Polyvinyl Alcohol-Povidone PF 1.4-0.6 % SOLN Place 1 drop into both eyes 4 (four) times daily as needed (for irritation).    pravastatin (PRAVACHOL) 40 MG tablet Take 1 tablet (40 mg total) by mouth daily.   No facility-administered medications prior to visit.    Allergies  Allergen Reactions   Tape Rash and Other (See Comments)    Plastic     Patient Care Team: Mikey Kirschner, PA-C as PCP - General (Physician Assistant) Leandrew Koyanagi, MD as Referring  Physician (Ophthalmology)  Review of Systems  HENT:  Positive for postnasal drip.   Gastrointestinal:  Positive for diarrhea.  Genitourinary:  Positive for frequency.  Musculoskeletal:  Positive for neck pain.  Skin:  Positive for wound.      Objective    Blood pressure 110/70, pulse 63, temperature 98.4 F (36.9 C), temperature source Oral, height 6' 3.5" (1.918 m), weight 190 lb 8 oz (86.4 kg), SpO2 100 %.    Physical Exam Constitutional:      General: He is awake.     Appearance: He is well-developed.  HENT:     Head: Normocephalic.     Right Ear: Tympanic membrane, ear canal and external ear normal.     Left Ear: Tympanic membrane, ear canal and external ear normal.     Nose: Nose normal. No congestion or rhinorrhea.     Mouth/Throat:     Mouth: Mucous membranes are moist.     Pharynx: No oropharyngeal exudate or posterior oropharyngeal erythema.  Eyes:     Pupils: Pupils are equal, round, and reactive to light.  Cardiovascular:     Rate and Rhythm: Normal rate and regular rhythm.     Heart sounds: Normal heart sounds.  Pulmonary:     Effort: Pulmonary effort is normal.     Breath sounds: Normal breath sounds.  Abdominal:     General: There is no distension.     Palpations: Abdomen is soft.     Tenderness: There is no abdominal tenderness. There is no  guarding.  Musculoskeletal:     Cervical back: Normal range of motion.     Right lower leg: No edema.     Left lower leg: No edema.  Lymphadenopathy:     Cervical: No cervical adenopathy.  Skin:    General: Skin is warm.  Neurological:     Mental Status: He is alert and oriented to person, place, and time.  Psychiatric:        Attention and Perception: Attention normal.        Mood and Affect: Mood normal.        Speech: Speech normal.        Behavior: Behavior normal. Behavior is cooperative.     Most recent functional status assessment:    06/09/2022   10:03 AM  In your present state of health, do you  have any difficulty performing the following activities:  Hearing? 0  Vision? 0  Difficulty concentrating or making decisions? 0  Walking or climbing stairs? 0  Dressing or bathing? 0  Doing errands, shopping? 0   Most recent fall risk assessment:    06/09/2022   10:02 AM  Fall Risk   Falls in the past year? 0  Number falls in past yr: 0  Injury with Fall? 0  Risk for fall due to : History of fall(s)  Follow up Falls evaluation completed    Most recent depression screenings:    06/09/2022   10:03 AM 06/05/2021    8:50 AM  PHQ 2/9 Scores  PHQ - 2 Score 0 0  PHQ- 9 Score 1 1   Most recent cognitive screening:    06/03/2020   10:19 AM  6CIT Screen  What Year? 0 points  What month? 0 points  What time? 0 points  Count back from 20 0 points  Months in reverse 0 points  Repeat phrase 0 points  Total Score 0 points   Most recent Audit-C alcohol use screening    06/09/2022   10:02 AM  Alcohol Use Disorder Test (AUDIT)  1. How often do you have a drink containing alcohol? 0  2. How many drinks containing alcohol do you have on a typical day when you are drinking? 0  3. How often do you have six or more drinks on one occasion? 0  AUDIT-C Score 0   A score of 3 or more in women, and 4 or more in men indicates increased risk for alcohol abuse, EXCEPT if all of the points are from question 1   No results found for any visits on 06/09/22.  Assessment & Plan     Annual wellness visit done today including the all of the following: Reviewed patient's Family Medical History Reviewed and updated list of patient's medical providers Assessment of cognitive impairment was done Assessed patient's functional ability Established a written schedule for health screening [facility] Completed and Reviewed  Exercise Activities and Dietary recommendations --balanced diet high in fiber and protein, low in sugars, carbs, fats. --physical activity/exercise 30 minutes 3-5  times a week     Immunization History  Administered Date(s) Administered   Fluad Quad(high Dose 65+) 01/25/2019, 02/16/2022   Influenza, High Dose Seasonal PF 01/23/2015   Influenza, Seasonal, Injecte, Preservative Fre 02/14/2016   Influenza-Unspecified 02/04/2017, 02/09/2018, 02/06/2021   Moderna Sars-Covid-2 Vaccination 06/03/2019, 07/03/2019   PFIZER(Purple Top)SARS-COV-2 Vaccination 02/27/2020   Pneumococcal Conjugate-13 05/08/2014   Pneumococcal Polysaccharide-23 03/22/2009   Tdap 09/13/2007, 02/23/2018   Zoster, Live 06/02/2005  Health Maintenance  Topic Date Due   Zoster Vaccines- Shingrix (1 of 2) Never done   COVID-19 Vaccine (4 - 2023-24 season) 01/02/2022   Medicare Annual Wellness (AWV)  06/10/2023   DTaP/Tdap/Td (3 - Td or Tdap) 02/24/2028   Pneumonia Vaccine 34+ Years old  Completed   INFLUENZA VACCINE  Completed   Hepatitis C Screening  Completed   HPV VACCINES  Aged Out   COLONOSCOPY (Pts 45-34yr Insurance coverage will need to be confirmed)  Discontinued     Discussed health benefits of physical activity, and encouraged him to engage in regular exercise appropriate for his age and condition.    Problem List Items Addressed This Visit       Cardiovascular and Mediastinum   Essential hypertension    Chronic, well controlled Managed with lisinopril 5 mg  Ordered cmp F/u annually       Relevant Orders   Comprehensive metabolic panel     Hematopoietic and Hemostatic   Thrombocytopenia (HCC)    Had improved last year, pt experiencing more bruising Will check cbc w/ diff      Relevant Orders   CBC with Differential/Platelet     Other   Prediabetes    Historically, pt also experiencing potential hypoglycemic episodes Advised he check his BS during these episodes at home; if BS normal consider carotid [place] a1[place]      Relevant Orders   HgB A1c   Hyperlipidemia    Controlled, managed with pravastatin 40 mg  Repeat fasting lipids/cmp       Relevant Orders   Lipid Profile   Comprehensive metabolic panel   Other Visit Diagnoses     Encounter for annual wellness exam in Medicare patient    -  Primary        Return in about 1 year (around 06/10/2023) for CPE, AVW, or earlier if needed.     I, LMikey Kirschner PA-C have reviewed all documentation for this visit. The documentation on  06/09/22  for the exam, diagnosis, procedures, and orders are all accurate and complete.  LMikey Kirschner PA-C [facility] [place] [address]Office: [phone]: [phone]

## 2022-06-09 NOTE — Assessment & Plan Note (Signed)
Had improved last year, pt experiencing more bruising Will check cbc w/ diff

## 2022-06-10 LAB — HEMOGLOBIN A1C
Est. average glucose Bld gHb Est-mCnc: 120 mg/dL
Hgb A1c MFr Bld: 5.8 % — ABNORMAL HIGH (ref 4.8–5.6)

## 2022-06-10 LAB — CBC WITH DIFFERENTIAL/PLATELET
Basophils Absolute: 0 10*3/uL (ref 0.0–0.2)
Basos: 1 %
EOS (ABSOLUTE): 0.1 10*3/uL (ref 0.0–0.4)
Eos: 3 %
Hematocrit: 41.4 % (ref 37.5–51.0)
Hemoglobin: 14.2 g/dL (ref 13.0–17.7)
Immature Grans (Abs): 0 10*3/uL (ref 0.0–0.1)
Immature Granulocytes: 0 %
Lymphocytes Absolute: 1.8 10*3/uL (ref 0.7–3.1)
Lymphs: 43 %
MCH: 31.3 pg (ref 26.6–33.0)
MCHC: 34.3 g/dL (ref 31.5–35.7)
MCV: 91 fL (ref 79–97)
Monocytes Absolute: 0.4 10*3/uL (ref 0.1–0.9)
Monocytes: 9 %
Neutrophils Absolute: 1.9 10*3/uL (ref 1.4–7.0)
Neutrophils: 44 %
Platelets: 134 10*3/uL — ABNORMAL LOW (ref 150–450)
RBC: 4.54 x10E6/uL (ref 4.14–5.80)
RDW: 12.7 % (ref 11.6–15.4)
WBC: 4.2 10*3/uL (ref 3.4–10.8)

## 2022-06-10 LAB — COMPREHENSIVE METABOLIC PANEL
ALT: 19 IU/L (ref 0–44)
AST: 19 IU/L (ref 0–40)
Albumin/Globulin Ratio: 2 (ref 1.2–2.2)
Albumin: 4.3 g/dL (ref 3.8–4.8)
Alkaline Phosphatase: 46 IU/L (ref 44–121)
BUN/Creatinine Ratio: 18 (ref 10–24)
BUN: 20 mg/dL (ref 8–27)
Bilirubin Total: 0.5 mg/dL (ref 0.0–1.2)
CO2: 24 mmol/L (ref 20–29)
Calcium: 9.1 mg/dL (ref 8.6–10.2)
Chloride: 105 mmol/L (ref 96–106)
Creatinine, Ser: 1.13 mg/dL (ref 0.76–1.27)
Globulin, Total: 2.1 g/dL (ref 1.5–4.5)
Glucose: 97 mg/dL (ref 70–99)
Potassium: 4.4 mmol/L (ref 3.5–5.2)
Sodium: 141 mmol/L (ref 134–144)
Total Protein: 6.4 g/dL (ref 6.0–8.5)
eGFR: 66 mL/min/{1.73_m2} (ref >59)

## 2022-06-10 LAB — LIPID PANEL
Chol/HDL Ratio: 3.4 ratio (ref 0.0–5.0)
Cholesterol, Total: 142 mg/dL (ref 100–199)
HDL: 42 mg/dL (ref >39)
LDL Chol Calc (NIH): 84 mg/dL (ref 0–99)
Triglycerides: 82 mg/dL (ref 0–149)
VLDL Cholesterol Cal: 16 mg/dL (ref 5–40)

## 2022-07-29 DIAGNOSIS — H35372 Puckering of macula, left eye: Secondary | ICD-10-CM | POA: Diagnosis not present

## 2022-07-29 DIAGNOSIS — H43813 Vitreous degeneration, bilateral: Secondary | ICD-10-CM | POA: Diagnosis not present

## 2022-07-29 DIAGNOSIS — M3501 Sicca syndrome with keratoconjunctivitis: Secondary | ICD-10-CM | POA: Diagnosis not present

## 2022-07-29 DIAGNOSIS — H401131 Primary open-angle glaucoma, bilateral, mild stage: Secondary | ICD-10-CM | POA: Diagnosis not present

## 2022-07-29 DIAGNOSIS — B399 Histoplasmosis, unspecified: Secondary | ICD-10-CM | POA: Diagnosis not present

## 2022-08-18 ENCOUNTER — Ambulatory Visit (INDEPENDENT_AMBULATORY_CARE_PROVIDER_SITE_OTHER): Payer: Medicare Other | Admitting: Physician Assistant

## 2022-08-18 ENCOUNTER — Encounter: Payer: Self-pay | Admitting: Physician Assistant

## 2022-08-18 VITALS — BP 127/78 | HR 63 | Ht 75.5 in | Wt 191.8 lb

## 2022-08-18 DIAGNOSIS — E785 Hyperlipidemia, unspecified: Secondary | ICD-10-CM

## 2022-08-18 DIAGNOSIS — M79604 Pain in right leg: Secondary | ICD-10-CM

## 2022-08-18 DIAGNOSIS — L989 Disorder of the skin and subcutaneous tissue, unspecified: Secondary | ICD-10-CM | POA: Diagnosis not present

## 2022-08-18 MED ORDER — PRAVASTATIN SODIUM 40 MG PO TABS: 40.0000 mg | ORAL_TABLET | Freq: Every day | ORAL | 3 refills | Status: DC

## 2022-08-18 NOTE — Progress Notes (Signed)
I,Sha'taria Tyson,acting as a Neurosurgeon for [employer], PA-C.,have documented all relevant documentation on the behalf of Alfredia Ferguson, PA-C,as directed by  Alfredia Ferguson, PA-C while in the presence of Alfredia Ferguson, PA-C.   Established patient visit   Patient: Patrick Good   DOB: [birth year]   82 y.o. Male  MRN: [id number] Visit Date: 08/18/2022  Today's healthcare provider: Alfredia Ferguson, PA-C   Cc. Skin lesions, leg pain/cramping  Subjective    HPI   Pt's wife noticed some spots on his upper/mid back and wanted him checked. Denies itching, pain. He would like a referral to a new dermatologist.  He reports right leg pain/cramping, 2-3 times a week at night. Denies that this wakes him up from sleep. Reports it feels muscular. Denies skin changes. Reports he drinks mainly coffee and orange juice, not much water.  Medications: Outpatient Medications Prior to Visit  Medication Sig   Cholecalciferol (VITAMIN D3 PO) Take 1 capsule by mouth daily.   latanoprost (XALATAN) 0.005 % ophthalmic solution Place 1 drop into both eyes at bedtime.    lisinopril (ZESTRIL) 5 MG tablet TAKE 1 TABLET DAILY (APPOINTMENT IS NEEDED FOR FURTHER REFILLS)   Multiple Vitamins-Minerals (PRESERVISION AREDS) TABS Take by mouth.   Polyvinyl Alcohol-Povidone PF 1.4-0.6 % SOLN Place 1 drop into both eyes 4 (four) times daily as needed (for irritation).    [DISCONTINUED] pravastatin (PRAVACHOL) 40 MG tablet Take 1 tablet (40 mg total) by mouth daily.   No facility-administered medications prior to visit.    Review of Systems  Constitutional:  Negative for fatigue and fever.  Respiratory:  Negative for cough and shortness of breath.   Cardiovascular:  Negative for chest pain, palpitations and leg swelling.  Musculoskeletal:  Positive for myalgias.  Neurological:  Negative for dizziness and headaches.       Objective    BP 127/78 (BP Location: Left Arm, Patient Position: Sitting, Cuff Size:  Normal)   Pulse 63   Ht 6' 3.5" (1.918 m)   Wt 191 lb 12.8 oz (87 kg)   SpO2 100%   BMI 23.66 kg/m    Physical Exam Vitals reviewed.  Constitutional:      Appearance: He is not ill-appearing.  HENT:     Head: Normocephalic.  Eyes:     Conjunctiva/sclera: Conjunctivae normal.  Cardiovascular:     Rate and Rhythm: Normal rate.  Pulmonary:     Effort: Pulmonary effort is normal. No respiratory distress.  Musculoskeletal:     Comments: Right LE with no skin changes, there is a varicose vein the area of patient pain. No erythema, edema.   Skin:    Comments: Mid back with several skin colored and brown raised scaled/smooth lesions.   Neurological:     General: No focal deficit present.     Mental Status: He is alert and oriented to person, place, and time.  Psychiatric:        Mood and Affect: Mood normal.        Behavior: Behavior normal.      No results found for any visits on 08/18/22.  Assessment & Plan     1. Skin lesions Appear benign. Advised it wise to do an annual skin exam w/ derm. referred - Ambulatory referral to Dermatology  2. Right leg pain Possible 2/2 varicose vein. Pt not interested in any vein surgery. Advised compression socks. Advised increasing fluid intake  3. Hyperlipidemia, unspecified hyperlipidemia type Refilled pravastatin - pravastatin (PRAVACHOL) 40 MG  tablet; Take 1 tablet (40 mg total) by mouth daily.  Dispense: 90 tablet; Refill: 3   Return if symptoms worsen or fail to improve.      I, Alfredia Ferguson, PA-C have reviewed all documentation for this visit. The documentation on  08/18/22  for the exam, diagnosis, procedures, and orders are all accurate and complete.  Alfredia Ferguson, PA-C [facility] [address] [place], [place], [address] Office: [phone] Fax: [phone]   [facility]

## 2023-02-01 DIAGNOSIS — H401131 Primary open-angle glaucoma, bilateral, mild stage: Secondary | ICD-10-CM | POA: Diagnosis not present

## 2023-02-01 DIAGNOSIS — H353131 Nonexudative age-related macular degeneration, bilateral, early dry stage: Secondary | ICD-10-CM | POA: Diagnosis not present

## 2023-02-01 DIAGNOSIS — M3501 Sicca syndrome with keratoconjunctivitis: Secondary | ICD-10-CM | POA: Diagnosis not present

## 2023-02-01 DIAGNOSIS — H43813 Vitreous degeneration, bilateral: Secondary | ICD-10-CM | POA: Diagnosis not present

## 2023-03-02 DIAGNOSIS — Z23 Encounter for immunization: Secondary | ICD-10-CM | POA: Diagnosis not present

## 2023-05-26 ENCOUNTER — Other Ambulatory Visit: Payer: Self-pay | Admitting: Physician Assistant

## 2023-05-26 DIAGNOSIS — I10 Essential (primary) hypertension: Secondary | ICD-10-CM

## 2023-06-14 ENCOUNTER — Ambulatory Visit: Payer: Medicare Other | Admitting: Family Medicine

## 2023-06-14 ENCOUNTER — Encounter: Payer: Self-pay | Admitting: Family Medicine

## 2023-06-14 VITALS — BP 117/72 | HR 65 | Resp 16 | Ht 75.5 in | Wt 188.8 lb

## 2023-06-14 DIAGNOSIS — I10 Essential (primary) hypertension: Secondary | ICD-10-CM

## 2023-06-14 DIAGNOSIS — E559 Vitamin D deficiency, unspecified: Secondary | ICD-10-CM

## 2023-06-14 DIAGNOSIS — R7303 Prediabetes: Secondary | ICD-10-CM | POA: Diagnosis not present

## 2023-06-14 DIAGNOSIS — D696 Thrombocytopenia, unspecified: Secondary | ICD-10-CM

## 2023-06-14 DIAGNOSIS — Z0001 Encounter for general adult medical examination with abnormal findings: Secondary | ICD-10-CM

## 2023-06-14 DIAGNOSIS — R351 Nocturia: Secondary | ICD-10-CM | POA: Diagnosis not present

## 2023-06-14 DIAGNOSIS — Z0000 Encounter for general adult medical examination without abnormal findings: Secondary | ICD-10-CM | POA: Insufficient documentation

## 2023-06-14 NOTE — Assessment & Plan Note (Signed)
 Well-controlled Continue 5 mg lisinopril  daily.

## 2023-06-14 NOTE — Assessment & Plan Note (Signed)
 Nocturia with urination twice per night. No difficulty starting or stopping the stream, and no dribbling. Declines medication for BPH at this time. Discussed nocturia as a sign of possible BPH and offered medication options, which he declined.   - Monitor symptoms and reassess if they worsen

## 2023-06-14 NOTE — Assessment & Plan Note (Signed)
 Continue pravastatin 40 mg daily.

## 2023-06-14 NOTE — Assessment & Plan Note (Addendum)
 History of mild thrombocytopenia. Patient continues to experience easy bruising, from which ecchymoses resolve on a stable/unchanged timeline.  No acute concerns.  Continue to monitor.

## 2023-06-14 NOTE — Progress Notes (Addendum)
 Annual Wellness Visit     Patient: Patrick Good, Male    DOB: 01/22/1941, 83 y.o.   MRN: [id number] Visit Date: 06/14/2023  Today's Provider: Carlean Charter, DO   Chief Complaint  Patient presents with   Medicare Wellness   Subjective    Patrick Good is a 83 y.o. male who presents today for his Annual Wellness Visit. He reports consuming a general diet but avoids bread/pastries.  He engages in regular walking as exercise.  He generally feels well. He reports sleeping fairly well but has to get up to urinate twice per night. He does not have additional problems to discuss today.   HPI Patrick Summit Rester "H.R." is an 83 year old male who presents for an annual wellness visit.  He experiences intermittent thrombocytopenia, leading to easy bruising without significant trauma. These bruises resolve but recur frequently. No associated symptoms such as chest pain, shortness of breath, or dizziness are present.  He occasionally experiences Raynaud's phenomenon, with coldness and whitening of his fingertips approximately once a month, resolving with warming. Recently, he had rib soreness after lifting a heavy object, which has mostly resolved with ibuprofen. No ear pain, throat pain, congestion, lightheadedness, or headaches are reported.  He experiences nocturia, getting up twice a night to urinate, but has no issues with starting or stopping the stream or dribbling.  He maintains a diet low in bread and pastries, primarily consuming meat, salads, and vegetables, and engages in regular walking as exercise. He has lost significant weight from 252 pounds to 190 pounds by avoiding bread and pastries.  His medical history includes a broken thumb at age 73, a left foot injury in 1982, a left meniscus tear in 2013, and a tibia and fibula fracture in 2018, treated with a rod insertion and later removal in 2019. He has a history of shingles in 2005 and believes he received the shingles  vaccine in 2006 and a second dose at a later date of which he is uncertain. He denies any current issues with his thyroid  and reports no numbness or tingling except for the occasional cold fingertips.   Medications: Outpatient Medications Prior to Visit  Medication Sig   Cholecalciferol  (VITAMIN D3 PO) Take 1 capsule by mouth daily.   latanoprost  (XALATAN ) 0.005 % ophthalmic solution Place 1 drop into both eyes at bedtime.    lisinopril  (ZESTRIL ) 5 MG tablet TAKE 1 TABLET DAILY (APPOINTMENT IS NEEDED FOR FURTHER REFILLS)   Multiple Vitamins-Minerals (PRESERVISION AREDS) TABS Take by mouth.   Polyvinyl Alcohol -Povidone PF 1.4-0.6 % SOLN Place 1 drop into both eyes 4 (four) times daily as needed (for irritation).    pravastatin  (PRAVACHOL ) 40 MG tablet Take 1 tablet (40 mg total) by mouth daily.   No facility-administered medications prior to visit.    Allergies  Allergen Reactions   Tape Rash and Other (See Comments)    Plastic     Patient Care Team: Carlean Charter, DO as PCP - General (Family Medicine) Annell Kidney, MD as Referring Physician (Ophthalmology)  Review of Systems  Constitutional:  Negative for appetite change, chills, fatigue and fever.  HENT:  Negative for congestion, ear pain, hearing loss, nosebleeds and trouble swallowing.   Eyes:  Negative for pain and visual disturbance.  Respiratory:  Negative for cough, chest tightness and shortness of breath.   Cardiovascular:  Negative for chest pain, palpitations and leg swelling.  Gastrointestinal:  Negative for abdominal pain, blood in stool, constipation,  diarrhea, nausea and vomiting.  Endocrine: Negative for polydipsia, polyphagia and polyuria.  Genitourinary:  Negative for dysuria and flank pain.       +nocturia  Musculoskeletal:  Negative for arthralgias, back pain, joint swelling, myalgias and neck stiffness.  Skin:  Negative for color change, rash and wound.  Neurological:  Negative for dizziness,  tremors, seizures, speech difficulty, weakness, light-headedness and headaches.  Hematological:  Bruises/bleeds easily.  Psychiatric/Behavioral:  Negative for behavioral problems, confusion, decreased concentration, dysphoric mood and sleep disturbance. The patient is not nervous/anxious.   All other systems reviewed and are negative.        Objective    Vitals: BP 117/72 (BP Location: Left Arm, Patient Position: Sitting, Cuff Size: Normal)   Pulse 65   Resp 16   Ht 6' 3.5" (1.918 m)   Wt 188 lb 12.8 oz (85.6 kg)   BMI 23.29 kg/m     Physical Exam Vitals reviewed.  Constitutional:      General: He is not in acute distress.    Appearance: Normal appearance. He is not diaphoretic.  HENT:     Head: Normocephalic and atraumatic.  Eyes:     General: No scleral icterus.    Conjunctiva/sclera: Conjunctivae normal.  Cardiovascular:     Rate and Rhythm: Normal rate and regular rhythm.     Pulses: Normal pulses.     Heart sounds: Normal heart sounds. No murmur heard. Pulmonary:     Effort: Pulmonary effort is normal. No respiratory distress.     Breath sounds: Normal breath sounds. No wheezing or rhonchi.  Abdominal:     General: Bowel sounds are normal. There is no distension.     Palpations: Abdomen is soft.     Tenderness: There is no abdominal tenderness.  Musculoskeletal:     Cervical back: Neck supple.     Right lower leg: No edema.     Left lower leg: No edema.  Lymphadenopathy:     Cervical: No cervical adenopathy.  Skin:    General: Skin is warm and dry.     Findings: No rash.  Neurological:     Mental Status: He is alert and oriented to person, place, and time. Mental status is at baseline.  Psychiatric:        Mood and Affect: Mood normal.        Behavior: Behavior normal.     Most recent functional status assessment:    06/14/2023   10:05 AM  In your present state of health, do you have any difficulty performing the following activities:  Hearing? 0   Vision? 0  Difficulty concentrating or making decisions? 0  Comment occ  Walking or climbing stairs? 0  Dressing or bathing? 0  Doing errands, shopping? 0   Most recent fall risk assessment:    06/14/2023   10:04 AM  Fall Risk   Falls in the past year? 0  Number falls in past yr: 0  Injury with Fall? 0  Risk for fall due to : No Fall Risks    Most recent depression screenings:    06/14/2023   10:04 AM 08/18/2022    3:11 PM  PHQ 2/9 Scores  PHQ - 2 Score 0 0  PHQ- 9 Score  1   Most recent cognitive screening:    06/14/2023   10:09 AM  6CIT Screen  What Year? 0 points  What month? 0 points  What time? 0 points  Count back from 20 0 points  Months  in reverse 0 points  Repeat phrase 0 points  Total Score 0 points   Most recent Audit-C alcohol  use screening    08/18/2022    3:11 PM  Alcohol  Use Disorder Test (AUDIT)  1. How often do you have a drink containing alcohol ? 0  2. How many drinks containing alcohol  do you have on a typical day when you are drinking? 0  3. How often do you have six or more drinks on one occasion? 0  AUDIT-C Score 0   A score of 3 or more in women, and 4 or more in men indicates increased risk for alcohol  abuse, EXCEPT if all of the points are from question 1   No results found for any visits on 06/14/23.  Assessment & Plan     Annual wellness visit done today including the all of the following: Reviewed patient's Family Medical History Reviewed and updated list of patient's medical providers Assessment of cognitive impairment was done Assessed patient's functional ability Established a written schedule for health screening services Health Risk Assessent Completed and Reviewed Patient is not currently on any opioid medications.  Exercise Activities and Dietary recommendations  Goals   None     Immunization History  Administered Date(s) Administered   Fluad Quad(high Dose 65+) 01/25/2019, 02/16/2022   Influenza, High Dose  Seasonal PF 01/23/2015   Influenza, Seasonal, Injecte, Preservative Fre 02/14/2016   Influenza-Unspecified 02/04/2017, 02/09/2018, 02/06/2021   Moderna Sars-Covid-2 Vaccination 06/03/2019, 07/03/2019   PFIZER(Purple Top)SARS-COV-2 Vaccination 02/27/2020   Pneumococcal Conjugate-13 05/08/2014   Pneumococcal Polysaccharide-23 03/22/2009   Tdap 09/13/2007, 02/23/2018   Zoster, Live 06/02/2005    Health Maintenance  Topic Date Due   Zoster Vaccines- Shingrix (1 of 2) 09/11/2023 (Originally 03/27/1993)   COVID-19 Vaccine (4 - 2024-25 season) 01/03/2024 (Originally 01/03/2023)   Medicare Annual Wellness (AWV)  06/13/2024   DTaP/Tdap/Td (3 - Td or Tdap) 02/24/2028   Pneumonia Vaccine 47+ Years old  Completed   INFLUENZA VACCINE  Completed   HPV VACCINES  Aged Out   Colonoscopy  Discontinued   Hepatitis C Screening  Discontinued     Discussed health benefits of physical activity, and encouraged him to engage in regular exercise appropriate for his age and condition.    Encounter for annual wellness exam in Medicare patient Assessment & Plan: Declines COVID booster but maintains up-to-date flu vaccinations. Received shingles vaccine in the past but unsure of exact dates. Discussed the importance of keeping vaccinations up to date and the benefits of the COVID booster, which he declined based on personal beliefs.   - Update records with the second shingles vaccine date   - Continue annual flu vaccinations     Essential hypertension Assessment & Plan: Well-controlled Continue 5 mg lisinopril  daily.  Orders: -     Comprehensive metabolic panel  Prediabetes -     Hemoglobin A1c  Vitamin D  deficiency -     VITAMIN D  25 Hydroxy (Vit-D Deficiency, Fractures)  Thrombocytopenia (HCC) Assessment & Plan: History of mild thrombocytopenia. Patient continues to experience easy bruising, from which ecchymoses resolve on a stable/unchanged timeline.  No acute concerns.  Continue to  monitor.   Nocturia Assessment & Plan: Nocturia with urination twice per night. No difficulty starting or stopping the stream, and no dribbling. Declines medication for BPH at this time. Discussed nocturia as a sign of possible BPH and offered medication options, which he declined.   - Monitor symptoms and reassess if they worsen  Return in about 1 year (around 06/13/2024) for CPE.   Dermatology 08/11/2023  I discussed the assessment and treatment plan with the patient  The patient was provided an opportunity to ask questions and all were answered. The patient agreed with the plan and demonstrated an understanding of the instructions.   The patient was advised to call back or seek an in-person evaluation if the symptoms worsen or if the condition fails to improve as anticipated.    Carlean Charter, DO  [facility] [phone] (phone) [phone] (fax)  [facility]

## 2023-06-14 NOTE — Assessment & Plan Note (Addendum)
 Declines COVID booster but maintains up-to-date flu vaccinations. Received shingles vaccine in the past but unsure of exact dates. Discussed the importance of keeping vaccinations up to date and the benefits of the COVID booster, which he declined based on personal beliefs.   - Update records with the second shingles vaccine date   - Continue annual flu vaccinations

## 2023-06-15 ENCOUNTER — Encounter: Payer: Self-pay | Admitting: Family Medicine

## 2023-06-15 LAB — COMPREHENSIVE METABOLIC PANEL
ALT: 17 [IU]/L (ref 0–44)
AST: 21 [IU]/L (ref 0–40)
Albumin: 4.2 g/dL (ref 3.8–4.8)
Alkaline Phosphatase: 59 [IU]/L (ref 44–121)
BUN/Creatinine Ratio: 18 (ref 10–24)
BUN: 24 mg/dL (ref 8–27)
Bilirubin Total: 0.7 mg/dL (ref 0.0–1.2)
CO2: 25 mmol/L (ref 20–29)
Calcium: 9.3 mg/dL (ref 8.6–10.2)
Chloride: 105 mmol/L (ref 96–106)
Creatinine, Ser: 1.33 mg/dL — ABNORMAL HIGH (ref 0.76–1.27)
Globulin, Total: 2.3 g/dL (ref 1.5–4.5)
Glucose: 92 mg/dL (ref 70–99)
Potassium: 4.8 mmol/L (ref 3.5–5.2)
Sodium: 143 mmol/L (ref 134–144)
Total Protein: 6.5 g/dL (ref 6.0–8.5)
eGFR: 54 mL/min/{1.73_m2} — ABNORMAL LOW (ref >59)

## 2023-06-15 LAB — VITAMIN D 25 HYDROXY (VIT D DEFICIENCY, FRACTURES): Vit D, 25-Hydroxy: 39.1 ng/mL (ref 30.0–100.0)

## 2023-06-15 LAB — HEMOGLOBIN A1C
Est. average glucose Bld gHb Est-mCnc: 120 mg/dL
Hgb A1c MFr Bld: 5.8 % — ABNORMAL HIGH (ref 4.8–5.6)

## 2023-07-15 ENCOUNTER — Other Ambulatory Visit: Payer: Self-pay | Admitting: Family Medicine

## 2023-07-16 MED ORDER — POLYVINYL ALCOHOL-POVIDONE PF 1.4-0.6 % OP SOLN
1.0000 [drp] | Freq: Four times a day (QID) | OPHTHALMIC | 0 refills | Status: AC | PRN
Start: 2023-07-16 — End: ?

## 2023-07-19 DIAGNOSIS — H43813 Vitreous degeneration, bilateral: Secondary | ICD-10-CM | POA: Diagnosis not present

## 2023-07-19 DIAGNOSIS — H401131 Primary open-angle glaucoma, bilateral, mild stage: Secondary | ICD-10-CM | POA: Diagnosis not present

## 2023-07-19 DIAGNOSIS — H353131 Nonexudative age-related macular degeneration, bilateral, early dry stage: Secondary | ICD-10-CM | POA: Diagnosis not present

## 2023-07-20 ENCOUNTER — Other Ambulatory Visit: Payer: Self-pay | Admitting: Family Medicine

## 2023-07-20 DIAGNOSIS — I10 Essential (primary) hypertension: Secondary | ICD-10-CM

## 2023-07-20 NOTE — Telephone Encounter (Signed)
 Copied from CRM [id number]. Topic: Clinical - Medication Refill >> Jul 20, 2023 11:43 AM Priscille Loveless wrote: Most Recent Primary Care Visit:  Provider: Sherlyn Hay  Department: [facility]  Visit Type: MEDICARE AWV, SEQUENTIAL  Date: 06/14/2023  Medication: lisinopril (ZESTRIL) 5 MG tablet  Has the patient contacted their pharmacy? Yes  Is this the correct pharmacy for this prescription? Yes  This is the patient's preferred pharmacy:   [facility] - [place], [place] - [address] [place] [address] Phone: [phone] Fax: [phone]   Has the prescription been filled recently? Yes  Is the patient out of the medication? Yes  Has the patient been seen for an appointment in the last year OR does the patient have an upcoming appointment? Yes  Can we respond through MyChart? No  Agent: Please be advised that Rx refills may take up to 3 business days. We ask that you follow-up with your pharmacy.

## 2023-07-21 MED ORDER — LISINOPRIL 5 MG PO TABS: 5.0000 mg | ORAL_TABLET | Freq: Every day | ORAL | 0 refills | Status: DC

## 2023-07-21 NOTE — Telephone Encounter (Signed)
 Last OV 06/09/23 with protocol to refill.  Requested Prescriptions  Pending Prescriptions Disp Refills   lisinopril (ZESTRIL) 5 MG tablet 90 tablet 0    Sig: Take 1 tablet (5 mg total) by mouth daily.     Cardiovascular:  ACE Inhibitors Failed - 07/21/2023  3:59 PM      Failed - Cr in normal range and within 180 days    Creatinine, Ser  Date Value Ref Range Status  06/14/2023 1.33 (H) 0.76 - 1.27 mg/dL Final         Failed - Valid encounter within last 6 months    Recent Outpatient Visits           11 months ago Skin lesions   [facility] Alfredia Ferguson, PA-C   4 years ago Prediabetes   [facility] [place], Ricki Rodriguez M, [place]   4 years ago Essential hypertension   [facility] [place], Victorino Dike M, [place]   4 years ago Essential hypertension   [place] [place], Victorino Dike M, [place]   5 years ago Medicare annual wellness visit, subsequent   [facility] [place], [place], [place]       Future Appointments             In 1 month Deirdre Evener, MD [facility]            Passed - K in normal range and within 180 days    Potassium  Date Value Ref Range Status  06/14/2023 4.8 3.5 - 5.2 mmol/L Final         Passed - Patient is not pregnant      Passed - Last BP in normal range    BP Readings from Last 1 Encounters:  06/14/23 117/72

## 2023-07-26 ENCOUNTER — Other Ambulatory Visit: Payer: Self-pay | Admitting: Family Medicine

## 2023-07-26 DIAGNOSIS — I10 Essential (primary) hypertension: Secondary | ICD-10-CM

## 2023-08-03 ENCOUNTER — Other Ambulatory Visit: Payer: Self-pay

## 2023-08-03 DIAGNOSIS — I10 Essential (primary) hypertension: Secondary | ICD-10-CM

## 2023-08-03 MED ORDER — LISINOPRIL 5 MG PO TABS
5.0000 mg | ORAL_TABLET | Freq: Every day | ORAL | 0 refills | Status: DC
Start: 2023-08-03 — End: 2024-01-06

## 2023-08-06 ENCOUNTER — Other Ambulatory Visit: Payer: Self-pay | Admitting: Physician Assistant

## 2023-08-06 DIAGNOSIS — E785 Hyperlipidemia, unspecified: Secondary | ICD-10-CM

## 2023-08-11 ENCOUNTER — Ambulatory Visit: Payer: Medicare Other | Admitting: Dermatology

## 2023-08-24 ENCOUNTER — Encounter: Payer: Self-pay | Admitting: Dermatology

## 2023-08-24 ENCOUNTER — Ambulatory Visit (INDEPENDENT_AMBULATORY_CARE_PROVIDER_SITE_OTHER): Admitting: Dermatology

## 2023-08-24 DIAGNOSIS — L820 Inflamed seborrheic keratosis: Secondary | ICD-10-CM

## 2023-08-24 DIAGNOSIS — D229 Melanocytic nevi, unspecified: Secondary | ICD-10-CM

## 2023-08-24 DIAGNOSIS — Z1283 Encounter for screening for malignant neoplasm of skin: Secondary | ICD-10-CM

## 2023-08-24 DIAGNOSIS — L905 Scar conditions and fibrosis of skin: Secondary | ICD-10-CM | POA: Diagnosis not present

## 2023-08-24 DIAGNOSIS — L821 Other seborrheic keratosis: Secondary | ICD-10-CM | POA: Diagnosis not present

## 2023-08-24 DIAGNOSIS — L719 Rosacea, unspecified: Secondary | ICD-10-CM

## 2023-08-24 DIAGNOSIS — W908XXA Exposure to other nonionizing radiation, initial encounter: Secondary | ICD-10-CM | POA: Diagnosis not present

## 2023-08-24 DIAGNOSIS — Z7189 Other specified counseling: Secondary | ICD-10-CM | POA: Diagnosis not present

## 2023-08-24 DIAGNOSIS — D692 Other nonthrombocytopenic purpura: Secondary | ICD-10-CM | POA: Diagnosis not present

## 2023-08-24 DIAGNOSIS — D1801 Hemangioma of skin and subcutaneous tissue: Secondary | ICD-10-CM | POA: Diagnosis not present

## 2023-08-24 DIAGNOSIS — L814 Other melanin hyperpigmentation: Secondary | ICD-10-CM | POA: Diagnosis not present

## 2023-08-24 DIAGNOSIS — L570 Actinic keratosis: Secondary | ICD-10-CM

## 2023-08-24 DIAGNOSIS — L578 Other skin changes due to chronic exposure to nonionizing radiation: Secondary | ICD-10-CM

## 2023-08-24 NOTE — Progress Notes (Signed)
 Follow-Up Visit   Subjective  Patrick Good is a 83 y.o. male who presents for the following: Skin Cancer Screening and Upper Body Skin Exam hx of Rash back, arms, ~78yrs itchy in past, has almost resolved since waiting for appt, no hx of Skin Ca  The patient presents for Upper Body Skin Exam (UBSE) for skin cancer screening and mole check. The patient has spots, moles and lesions to be evaluated, some may be new or changing and the patient may have concern these could be cancer.  The following portions of the chart were reviewed this encounter and updated as appropriate: medications, allergies, medical history  Review of Systems:  No other skin or systemic complaints except as noted in HPI or Assessment and Plan.  Objective  Well appearing patient in no apparent distress; mood and affect are within normal limits.  All skin waist up examined. Relevant physical exam findings are noted in the Assessment and Plan.  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 They are chronic with expected duration over 1 year. A portion of actinic keratoses will progress to squamous cell carcinoma of the skin. It is not possible to reliably predict which spots will progress to skin cancer and so treatment is recommended to prevent development of skin cancer.  Recommend daily broad spectrum sunscreen SPF 30+ to sun-exposed areas, reapply every 2 hours as needed.  Recommend staying in the shade or wearing long sleeves, sun glasses (UVA+UVB protection) and wide brim hats (4-inch brim around the entire circumference of the hat). Call for new or changing lesions. Destruction of lesion - face, ears x 16 (16) Complexity: simple   Destruction method: cryotherapy   Informed  consent: discussed and consent obtained   Timeout:  patient name, date of birth, surgical site, and procedure verified Lesion destroyed using liquid nitrogen: Yes   Region frozen until ice ball extended beyond lesion: Yes   Outcome: patient tolerated procedure well with no complications   Post-procedure details: wound care instructions given   INFLAMED SEBORRHEIC KERATOSIS (5) face x 5 (5) Symptomatic, irritating, patient would like treated. Destruction of lesion - face x 5 (5) Complexity: simple   Destruction method: cryotherapy   Informed consent: discussed and consent obtained   Timeout:  patient name, date of birth, surgical site, and procedure verified Lesion destroyed using liquid nitrogen: Yes   Region frozen until ice ball extended beyond lesion: Yes   Outcome: patient tolerated procedure well with no complications   Post-procedure details: wound care instructions given   Skin cancer screening performed today.  Actinic Damage - Chronic condition, secondary to cumulative UV/sun exposure - diffuse scaly erythematous macules with underlying dyspigmentation - Recommend daily broad spectrum sunscreen SPF 30+ to sun-exposed areas, reapply every 2 hours as needed.  - Staying in the shade or wearing long sleeves, sun glasses (UVA+UVB protection) and wide brim hats (4-inch brim around the entire circumference of the hat) are also recommended for sun protection.  - Call for new or changing lesions.  Lentigines, Seborrheic Keratoses, Hemangiomas - Benign normal skin lesions - Benign-appearing - Call for any changes - Sks back  Melanocytic Nevi - Tan-brown and/or pink-flesh-colored symmetric macules and papules - Benign appearing on exam today - Observation - Call  clinic for new or changing moles - Recommend daily use of broad spectrum spf 30+ sunscreen to sun-exposed areas.   SCARS Upper back Exam: Spotty hypopigmented scarring  upper back shoulders Treatment: Benign-appearing.   Observation.  Call clinic for new or changing lesions.    Purpura - Chronic; persistent and recurrent.  Treatable, but not curable. 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 Condition is symptomatic / bothersome to patient. Not to goal. Rosacea is a chronic progressive skin condition usually affecting the face of adults, causing redness and/or acne bumps. It is treatable but not curable. It sometimes affects the eyes (ocular rosacea) as well. It may respond to topical and/or systemic medication and can flare with stress, sun exposure, alcohol , exercise, topical steroids (including hydrocortisone/cortisone 10) and some foods.  Daily application of broad spectrum spf 30+ sunscreen to face is recommended to reduce flares.  Patient denies grittiness of the eyes  Treatment Plan Counseling for BBL / IPL / Laser and Coordination of Care Discussed the treatment option of Broad Band Light (BBL) /Intense Pulsed Light (IPL)/ Laser for skin discoloration, including brown spots and redness.  Typically we recommend at least 1-3 treatment sessions about 5-8 weeks apart for best results.  Cannot have tanned skin when BBL performed, and regular use of sunscreen/photoprotection is advised after the procedure to help maintain results. The patient's condition may also require "maintenance treatments" in the future.  The fee for BBL / laser treatments is $350 per treatment session for the whole face.  A fee can be quoted for other parts of the body.  Insurance typically does not pay for BBL/laser treatments and therefore the fee is an  out-of-pocket cost. Recommend prophylactic valtrex treatment. Once scheduled for procedure, will send Rx in prior to patient's appointment.   Return for 6-8 months AK/ISK f/u.  I, Rollie Clipper, RMA, am acting as scribe for Celine Collard, MD .   Documentation: I have reviewed the above documentation for accuracy and completeness, and I agree with the above.  Celine Collard, MD

## 2023-08-24 NOTE — Patient Instructions (Addendum)
 Cryotherapy Aftercare  Wash gently with soap and water everyday.   Apply Vaseline and Band-Aid daily until healed.    Actinic Keratosis  What is an actinic keratosis? An actinic keratosis (plural: actinic keratoses) is growth on the surface of the skin that usually appears as a red, hard, crusty or scaly bump.   What causes actinic keratoses? Repeated prolonged sun exposure causes skin damage, especially in fair-skinned persons. Sun-damaged skin becomes dry and wrinkled and may form rough, scaly spots called actinic keratoses. These rough spots remain on the skin even though the crust or scale on top is picked off.   Why treat actinic keratoses? Actinic keratoses are not skin cancers, but because they may sometimes turn cancerous they are called "pre-cancerous". Not all will turn to skin cancer, and it usually takes several years for this to happen. Because it is much easier to treat an actinic keratosis then it is to remove a skin cancer, actinic keratoses should be treated to prevent future skin cancer.   How are actinic keratoses treated? The most common way of treating actinic keratoses is to freeze them with liquid nitrogen. Freezing causes scabbing and shedding of the sun-damaged skin. Healing after a removal usually takes two weeks, depending on the size and location of the keratosis. Hands and legs heal more slowly than the face. The skin's final appearance is usually excellent. There are several topical medications that can be used to treat actinic keratoses. These medications generally have side effects of redness, crusting, and pain. Some are used for a few days, and some for several months before the actinic keratosis is completely gone. Photodynamic therapy is another alternative to freezing actinic keratoses. This treatment is done in a physician's office. A medication is applied to the area of skin with actinic keratoses, and it is allowed to soak in for one or more hours. A special  light is then applied to the skin. Side effects include redness, burning, and peeling.  How can you prevent actinic keratoses? Protection from the sun is the best way to prevent actinic keratoses. The use of proper clothing and sunscreens can prevent the sun damage that leads to an actinic keratosis.  Unfortunately, some sun damage is permanent. Once sun damage has progressed to the point where actinic keratoses develop, new keratoses may appear even without further sun exposure. However, even in skin that is already heavily sun damaged, good sun protection can help reduce the number of actinic keratoses that will appear.   Seborrheic Keratosis  What causes seborrheic keratoses? Seborrheic keratoses are harmless, common skin growths that first appear during adult life.  As time goes by, more growths appear.  Some people may develop a large number of them.  Seborrheic keratoses appear on both covered and uncovered body parts.  They are not caused by sunlight.  The tendency to develop seborrheic keratoses can be inherited.  They vary in color from skin-colored to gray, brown, or even black.  They can be either smooth or have a rough, warty surface.   Seborrheic keratoses are superficial and look as if they were stuck on the skin.  Under the microscope this type of keratosis looks like layers upon layers of skin.  That is why at times the top layer may seem to fall off, but the rest of the growth remains and re-grows.    Treatment Seborrheic keratoses do not need to be treated, but can easily be removed in the office.  Seborrheic keratoses often cause symptoms  when they rub on clothing or jewelry.  Lesions can be in the way of shaving.  If they become inflamed, they can cause itching, soreness, or burning.  Removal of a seborrheic keratosis can be accomplished by freezing, burning, or surgery. If any spot bleeds, scabs, or grows rapidly, please return to have it checked, as these can be an indication of a  skin cancer.   Due to recent changes in healthcare laws, you may see results of your pathology and/or laboratory studies on MyChart before the doctors have had a chance to review them. We understand that in some cases there may be results that are confusing or concerning to you. Please understand that not all results are received at the same time and often the doctors may need to interpret multiple results in order to provide you with the best plan of care or course of treatment. Therefore, we ask that you please give [place] 2 business days to thoroughly review all your results before contacting the office for clarification. Should we see a critical lab result, you will be contacted sooner.   If You Need Anything After Your Visit  If you have any questions or concerns for your doctor, please call our main line at [phone] and press option 4 to reach your doctor's medical assistant. If no one answers, please leave a voicemail as directed and we will return your call as soon as possible. Messages left after 4 pm will be answered the following business day.   You may also send [place] a message via MyChart. We typically respond to MyChart messages within 1-2 business days.  For prescription refills, please ask your pharmacy to contact our office. Our fax number is [phone].  If you have an urgent issue when the clinic is closed that cannot wait until the next business day, you can page your doctor at the number below.    Please note that while we do our best to be available for urgent issues outside of office hours, we are not available 24/7.   If you have an urgent issue and are unable to reach [place], you may choose to seek medical care at your doctor's office, retail clinic, urgent care center, or emergency room.  If you have a medical emergency, please immediately call 911 or go to the emergency department.  Pager Numbers  - Dr. Gwen Pounds: [phone]  - Dr. Roseanne Reno: [phone]  - Dr.  Katrinka Blazing: [phone]   In the event of inclement weather, please call our main line at [phone] for an update on the status of any delays or closures.  Dermatology Medication Tips: Please keep the boxes that topical medications come in in order to help keep track of the instructions about where and how to use these. Pharmacies typically print the medication instructions only on the boxes and not directly on the medication tubes.   If your medication is too expensive, please contact our office at [phone] option 4 or send [place] a message through MyChart.   We are unable to tell what your co-pay for medications will be in advance as this is different depending on your insurance coverage. However, we may be able to find a substitute medication at lower cost or fill out paperwork to get insurance to cover a needed medication.   If a prior authorization is required to get your medication covered by your insurance company, please allow [place] 1-2 business days to complete this process.  Drug prices often vary depending on where the prescription  is filled and some pharmacies may offer cheaper prices.  The website www.goodrx.com contains coupons for medications through different pharmacies. The prices here do not account for what the cost may be with help from insurance (it may be cheaper with your insurance), but the website can give you the price if you did not use any insurance.  - You can print the associated coupon and take it with your prescription to the pharmacy.  - You may also stop by our office during regular business hours and pick up a GoodRx coupon card.  - If you need your prescription sent electronically to a different pharmacy, notify our office through [facility] or by phone at [phone] option 4.     Si Usted Necesita Algo Despus de Su Visita  Tambin puede enviarnos un mensaje a travs de Clinical cytogeneticist. Por lo general respondemos a los mensajes de MyChart en el transcurso  de 1 a 2 das hbiles.  Para renovar recetas, por favor pida a su farmacia que se ponga en contacto con nuestra oficina. Annie Sable de fax es [place] [phone].  Si tiene un asunto urgente cuando la clnica est cerrada y que no puede esperar hasta el siguiente da hbil, puede llamar/localizar a su doctor(a) al nmero que aparece a continuacin.   Por favor, tenga en cuenta que aunque hacemos todo lo posible para estar disponibles para asuntos urgentes fuera del horario de [place], no estamos disponibles las 24 horas del da, los 7 [address]  [address] de la [place].   Si tiene un problema urgente y no puede comunicarse con nosotros, puede optar por buscar atencin mdica  en el consultorio de su doctor(a), en una clnica privada, en un centro de atencin urgente o en una sala de emergencias.  Si tiene Engineer, drilling, por favor llame inmediatamente al 911 o vaya a la sala de emergencias.  Nmeros de bper  - Dr. Gwen Pounds: [phone]  - Dra. Roseanne Reno: [phone]  - Dr. Katrinka Blazing: [phone]   En caso de inclemencias del tiempo, por favor llame a Lacy Duverney principal al [phone] para una actualizacin sobre el [place] de cualquier retraso o cierre.  Consejos para la medicacin en dermatologa: Por favor, guarde las cajas en las que vienen los medicamentos de uso tpico para ayudarle a seguir las instrucciones sobre dnde y cmo usarlos. Las farmacias generalmente imprimen las instrucciones del medicamento slo en las cajas y no directamente en los tubos del [place].   Si su medicamento es muy caro, por favor, pngase en contacto con Rolm Gala llamando al [phone] y presione la opcin 4 o envenos un mensaje a travs de Clinical cytogeneticist.   No podemos decirle cul ser su copago por los medicamentos por adelantado ya que esto es diferente dependiendo de la cobertura de su seguro. Sin embargo, es posible que podamos encontrar un medicamento sustituto a Audiological scientist un formulario  para que el seguro cubra el medicamento que se considera necesario.   Si se requiere una autorizacin previa para que su compaa de seguros [place] su medicamento, por favor permtanos de 1 a 2 das hbiles para completar [address]  Los precios de los medicamentos varan con frecuencia dependiendo del Environmental consultant de dnde se surte la receta y alguna farmacias pueden ofrecer precios ms baratos.  El sitio web www.goodrx.com tiene cupones para medicamentos de Health and safety inspector. Los precios aqu no tienen en cuenta lo que podra costar con la ayuda del seguro (puede ser ms barato con su seguro), pero el sitio web  puede darle el precio si no utiliz [employer].  - Puede imprimir el cupn correspondiente y llevarlo con su receta a la farmacia.  - Tambin puede pasar por nuestra oficina durante el horario de atencin regular y Education officer, museum una tarjeta de cupones de GoodRx.  - Si necesita que su receta se enve electrnicamente a una farmacia diferente, informe a nuestra oficina a travs de MyChart de [place] o por telfono llamando al [phone] y presione la opcin 4.

## 2023-10-04 ENCOUNTER — Encounter: Payer: Self-pay | Admitting: Family Medicine

## 2023-10-04 ENCOUNTER — Other Ambulatory Visit: Payer: Self-pay

## 2023-10-04 DIAGNOSIS — E785 Hyperlipidemia, unspecified: Secondary | ICD-10-CM

## 2023-10-04 MED ORDER — PRAVASTATIN SODIUM 40 MG PO TABS: 40.0000 mg | ORAL_TABLET | Freq: Every day | ORAL | 3 refills | Status: DC

## 2023-10-06 ENCOUNTER — Other Ambulatory Visit: Payer: Self-pay

## 2023-10-06 DIAGNOSIS — E785 Hyperlipidemia, unspecified: Secondary | ICD-10-CM

## 2023-10-06 MED ORDER — PRAVASTATIN SODIUM 40 MG PO TABS: 40.0000 mg | ORAL_TABLET | Freq: Every day | ORAL | 3 refills | Status: DC

## 2023-10-06 NOTE — Telephone Encounter (Signed)
 [facility] pharmacy faxed refill request for the following medications:   pravastatin  (PRAVACHOL ) 40 MG tablet   Please advise    Prescription was sent to [facility]

## 2023-10-18 ENCOUNTER — Telehealth: Payer: Self-pay

## 2023-10-18 ENCOUNTER — Other Ambulatory Visit: Payer: Self-pay | Admitting: Family Medicine

## 2023-10-18 DIAGNOSIS — N182 Chronic kidney disease, stage 2 (mild): Secondary | ICD-10-CM

## 2023-10-18 DIAGNOSIS — D696 Thrombocytopenia, unspecified: Secondary | ICD-10-CM

## 2023-10-18 DIAGNOSIS — E785 Hyperlipidemia, unspecified: Secondary | ICD-10-CM

## 2023-10-18 MED ORDER — PRAVASTATIN SODIUM 40 MG PO TABS: 40.0000 mg | ORAL_TABLET | Freq: Every day | ORAL | 3 refills | Status: AC

## 2023-10-18 NOTE — Telephone Encounter (Signed)
 Spoke with pt made aware of new medication being sent to [facility] along with provider would like blood work drawn, patient states this week was a busy week for him. But is welcome to come in when he has a chance.

## 2023-10-21 DIAGNOSIS — D696 Thrombocytopenia, unspecified: Secondary | ICD-10-CM | POA: Diagnosis not present

## 2023-10-21 DIAGNOSIS — N182 Chronic kidney disease, stage 2 (mild): Secondary | ICD-10-CM | POA: Diagnosis not present

## 2023-10-22 LAB — BASIC METABOLIC PANEL WITH GFR
BUN/Creatinine Ratio: 18 (ref 10–24)
BUN: 28 mg/dL — ABNORMAL HIGH (ref 8–27)
CO2: 21 mmol/L (ref 20–29)
Calcium: 9.2 mg/dL (ref 8.6–10.2)
Chloride: 105 mmol/L (ref 96–106)
Creatinine, Ser: 1.57 mg/dL — ABNORMAL HIGH (ref 0.76–1.27)
Glucose: 85 mg/dL (ref 70–99)
Potassium: 4.7 mmol/L (ref 3.5–5.2)
Sodium: 142 mmol/L (ref 134–144)
eGFR: 44 mL/min/{1.73_m2} — ABNORMAL LOW (ref >59)

## 2023-10-22 LAB — CBC
Hematocrit: 44.5 % (ref 37.5–51.0)
Hemoglobin: 14.2 g/dL (ref 13.0–17.7)
MCH: 30.5 pg (ref 26.6–33.0)
MCHC: 31.9 g/dL (ref 31.5–35.7)
MCV: 96 fL (ref 79–97)
Platelets: 142 10*3/uL — ABNORMAL LOW (ref 150–450)
RBC: 4.66 x10E6/uL (ref 4.14–5.80)
RDW: 12.8 % (ref 11.6–15.4)
WBC: 5.8 10*3/uL (ref 3.4–10.8)

## 2023-10-28 ENCOUNTER — Encounter: Payer: Self-pay | Admitting: Family Medicine

## 2023-10-31 ENCOUNTER — Ambulatory Visit: Payer: Self-pay | Admitting: Family Medicine

## 2023-10-31 DIAGNOSIS — N183 Chronic kidney disease, stage 3 unspecified: Secondary | ICD-10-CM

## 2023-10-31 DIAGNOSIS — N1832 Chronic kidney disease, stage 3b: Secondary | ICD-10-CM

## 2023-11-15 DIAGNOSIS — H02889 Meibomian gland dysfunction of unspecified eye, unspecified eyelid: Secondary | ICD-10-CM | POA: Diagnosis not present

## 2023-11-15 DIAGNOSIS — H401131 Primary open-angle glaucoma, bilateral, mild stage: Secondary | ICD-10-CM | POA: Diagnosis not present

## 2023-11-15 DIAGNOSIS — H353131 Nonexudative age-related macular degeneration, bilateral, early dry stage: Secondary | ICD-10-CM | POA: Diagnosis not present

## 2023-11-15 DIAGNOSIS — H43813 Vitreous degeneration, bilateral: Secondary | ICD-10-CM | POA: Diagnosis not present

## 2023-11-17 ENCOUNTER — Encounter: Payer: Self-pay | Admitting: Family Medicine

## 2023-12-06 DIAGNOSIS — H02889 Meibomian gland dysfunction of unspecified eye, unspecified eyelid: Secondary | ICD-10-CM | POA: Diagnosis not present

## 2023-12-06 DIAGNOSIS — H353131 Nonexudative age-related macular degeneration, bilateral, early dry stage: Secondary | ICD-10-CM | POA: Diagnosis not present

## 2023-12-06 DIAGNOSIS — M3501 Sicca syndrome with keratoconjunctivitis: Secondary | ICD-10-CM | POA: Diagnosis not present

## 2023-12-06 DIAGNOSIS — H401131 Primary open-angle glaucoma, bilateral, mild stage: Secondary | ICD-10-CM | POA: Diagnosis not present

## 2023-12-13 ENCOUNTER — Other Ambulatory Visit: Payer: Self-pay | Admitting: Nephrology

## 2023-12-13 DIAGNOSIS — I10 Essential (primary) hypertension: Secondary | ICD-10-CM | POA: Diagnosis not present

## 2023-12-13 DIAGNOSIS — R8290 Unspecified abnormal findings in urine: Secondary | ICD-10-CM | POA: Diagnosis not present

## 2023-12-13 DIAGNOSIS — N1831 Chronic kidney disease, stage 3a: Secondary | ICD-10-CM | POA: Diagnosis not present

## 2023-12-13 NOTE — Progress Notes (Signed)
 ren Patient Name: Patrick Good, male   Patient DOB: 06-Sep-1976 Date of Service: 12/13/2023  Patient MRN: [id number] Provider Creating Note: Saralee Stank, MD  [phone] Primary Care Physician: Donzella Domino, DO  [address] Dr Johnita Poke [place] [address] Additional Physicians/ Providers:   Chief Complaint   Chief Complaint  Patient presents with  . New    History of Present Illness Patrick Good is a 83 y.o. 1-White male With medical problems of hypertension, prediabetes, actinic keratosis History of syncope and fall in June 2018 Thrombocytopenia Hyperlipidemia Left ankle fracture while playing volleyball.  Surgical repair at [facility]. 2018-right ankle fracture.  Surgically treated with rod but later it was removed. Right thumb fracture at age 36  He is seen today as new evaluation for chronic kidney disease.  Review of labs from Care Everywhere: February 2024-creatinine 1.13, GFR 66 06/14/2023-creatinine 1.33, GFR 54, hemoglobin A1c 5.8% 10/21/2023-creatinine 1.57, GFR 44  Hypertension is managed with low-dose lisinopril 5 mg daily.  He also takes pravastatin for hyperlipidemia.  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 No leg edema.  No joint pains.  Gets up once or twice a night to void.   Medications   Current Outpatient Medications:  .  lisinopril 5 MG tablet, Take 5 mg by mouth 1 (one) time each day, Disp: , Rfl:  .  Polyvinyl Alcohol-Povidone PF 1.4-0.6 % solution, Place 1 drop into both eyes 4 (four) times daily as needed (for irritation)., Disp: , Rfl:  .  pravastatin (PRAVACHOL) 40 MG tablet, Take 40 mg by mouth 1 (one) time each day, Disp: , Rfl:  .  Cholecalciferol (VITAMIN D-3 PO), Take 1 capsule by mouth  1 (one) time each day, Disp: , Rfl:  .  latanoprost (XALATAN) 0.005 % ophthalmic solution, Administer 1 drop into affected eye(s) every night, Disp: , Rfl:  .  Multiple Vitamins-Minerals (PreserVision AREDS) tablet, Take 2 tablets by mouth 1 (one) time each day, Disp: , Rfl:    Allergies Adhesive tape and Penicillins  Problem List There is no problem list on file for this patient.   Review of Systems  Constitutional:  Negative for chills and fever.  HENT:  Negative for hearing loss.   Eyes:  Negative for impaired.  Respiratory:  Negative for cough.   Cardiovascular:  Negative for chest pain.  Gastrointestinal:  Negative for heartburn.  Genitourinary:  Positive for nocturia. Negative for dysuria, flank pain, frequency, hematuria, urgency, urinary incontinence and urinary hesitancy.  Musculoskeletal:  Negative for myalgias.  Skin:  Negative for itching and rash.  Neurological:  Negative for dizziness.  Endo/Heme/Allergies:  Does not bruise/bleed easily.  Psychiatric/Behavioral:  Negative for depression.      History History reviewed. No pertinent past medical history.  Past Surgical History:  Procedure Laterality Date  . ANKLE SURGERY Left 1982   ORIF ANKLE FRACTURE  . ANKLE SURGERY Right 10/09/2016  . CATARACT EXTRACTION, BILATERAL  2019  . COLONOSCOPY  06/25/2020   WITH PROPOFOL  . EYE SURGERY  2018   Cataract Removal  . FRACTURE SURGERY  2018   Fibula/Tibia Fracture  . HARDWARE REMOVAL Right 01/04/2018   REMOVAL OF RIGHT TIBIAL HARDWARE  . HERNIA REPAIR    . KNEE SURGERY  12/2011  Left meniscus  . VASECTOMY  1972   Family History  Problem Relation Age of Onset  . Heart disease Mother   . Hypertension Mother   . Cancer Father   . Hypertension Sister   . Diabetes Sister   . Diabetes Sister    Social History   Tobacco Use  . Smoking status: Never  . Smokeless tobacco: Never  Substance Use Topics  . Alcohol use: Never    Physical Exam  Vitals BP  130/80 (BP Location: Left upper arm, Patient Position: Standing)   Pulse 67   Temp 98.1 F   Wt 187 lb (84.8 kg)   SpO2 99%   Vitals reviewed. Constitutional: No distress.  Cardiovascular:  Normal rate, regular rhythm and normal heart sounds.          He exhibits no edema.  Pulmonary/Chest: Effort normal and breath sounds normal. No respiratory distress.  Abdominal: Soft. There is no abdominal tenderness. No hernia.  Skin: Skin is warm and dry.  Psychiatric: He has a normal mood and affect. His behavior is normal.     Laboratory Studies  Chemistry  Lab Units 12/13/23 1317  ALBUMIN  30mg /L        No lab exists for component: IRON SATURATION, TRANSSATPER      Urine  Lab Units 12/13/23 1317  COLOR UA  Yellow  CLARITY UA  Clear  KETONES UA  Negative  PH UA  5.5  UROBILINOGEN UA  0.2    Imaging and Other Studies      Orders Placed This Encounter  . Ultrasound renal complete  . Renal Function Panel  . PTH, Intact  . Uric Acid  . Magnesium  . Kappa/Lambda free LT chains w/ratio, Serum  . Protein electrophoresis, serum  . ANA W/Reflex  . POCT Urinalysis, manual with scope     12/13/2023-office urinalysis: Glucose negative, bilirubin negative, ketones Neg, specific gravity 1.020, blood negative, pH 5.5, protein negative, nitrite negative, leukocyte negative.  Urine microalbumin/creatinine ratio 30-300 mg/g. Urine microscopic exam: Bland sediment   Impression/Recommendations   Patient is a 83 y.o. 1-White male with hypertension, hyperlipidemia now presents for new evaluation of increasing creatinine  1. Chronic kidney disease stage 3A (HCC)   2. Essential hypertension   3. Abnormal urine    No clear reason for worsening of creatinine over time.  Renal function was normal in February 2024.  Since then in 2025, creatinine has increased.  No IV contrast exposure.  No significant history of nonsteroidal use.  No illness or hospitalizations in the interim. He  has some microalbuminuria.  Plan: Renal ultrasound Screening serologies including kappa lambda ratio/SPEP Repeat renal panel today with oral hydration.  Follow-up in about 4 weeks to go over lab results results and imaging.  Return in about 4 weeks (around 01/10/2024).   Saralee Stank, MD [facility] [address], [place] [address] Ph: [phone] Fax: [phone]  The following portions of the patient's chart were reviewed in this encounter and updated as appropriate:  Allergies  Meds  Problems  Med Hx  Surg Hx  Fam Hx

## 2023-12-16 ENCOUNTER — Ambulatory Visit
Admission: RE | Admit: 2023-12-16 | Discharge: 2023-12-16 | Disposition: A | Discharge status: Home / Self Care | Source: Ambulatory Visit | Attending: Nephrology | Admitting: Nephrology

## 2023-12-16 DIAGNOSIS — R8290 Unspecified abnormal findings in urine: Secondary | ICD-10-CM | POA: Insufficient documentation

## 2023-12-16 DIAGNOSIS — N1831 Chronic kidney disease, stage 3a: Secondary | ICD-10-CM | POA: Diagnosis not present

## 2023-12-16 DIAGNOSIS — N281 Cyst of kidney, acquired: Secondary | ICD-10-CM | POA: Diagnosis not present

## 2024-01-05 ENCOUNTER — Other Ambulatory Visit: Payer: Self-pay | Admitting: Nephrology

## 2024-01-05 DIAGNOSIS — I10 Essential (primary) hypertension: Secondary | ICD-10-CM | POA: Diagnosis not present

## 2024-01-05 DIAGNOSIS — N281 Cyst of kidney, acquired: Secondary | ICD-10-CM

## 2024-01-06 ENCOUNTER — Other Ambulatory Visit: Payer: Self-pay | Admitting: Family Medicine

## 2024-01-06 DIAGNOSIS — I10 Essential (primary) hypertension: Secondary | ICD-10-CM

## 2024-01-11 ENCOUNTER — Ambulatory Visit
Admission: RE | Admit: 2024-01-11 | Discharge: 2024-01-11 | Disposition: A | Discharge status: Home / Self Care | Source: Ambulatory Visit | Attending: Nephrology | Admitting: Nephrology

## 2024-01-11 DIAGNOSIS — I700 Atherosclerosis of aorta: Secondary | ICD-10-CM | POA: Diagnosis not present

## 2024-01-11 DIAGNOSIS — N281 Cyst of kidney, acquired: Secondary | ICD-10-CM | POA: Insufficient documentation

## 2024-01-11 MED ORDER — GADOBUTROL 1 MMOL/ML IV SOLN
7.5000 mL | Freq: Once | INTRAVENOUS | Status: AC | PRN
  Administered 2024-01-11: 7.5 mL via INTRAVENOUS

## 2024-01-19 DIAGNOSIS — H401131 Primary open-angle glaucoma, bilateral, mild stage: Secondary | ICD-10-CM | POA: Diagnosis not present

## 2024-01-19 DIAGNOSIS — H353131 Nonexudative age-related macular degeneration, bilateral, early dry stage: Secondary | ICD-10-CM | POA: Diagnosis not present

## 2024-01-19 DIAGNOSIS — H02889 Meibomian gland dysfunction of unspecified eye, unspecified eyelid: Secondary | ICD-10-CM | POA: Diagnosis not present

## 2024-01-24 DIAGNOSIS — M3501 Sicca syndrome with keratoconjunctivitis: Secondary | ICD-10-CM | POA: Diagnosis not present

## 2024-02-19 DIAGNOSIS — Z23 Encounter for immunization: Secondary | ICD-10-CM | POA: Diagnosis not present

## 2024-02-20 ENCOUNTER — Encounter: Payer: Self-pay | Admitting: Family Medicine

## 2024-02-23 DIAGNOSIS — M3501 Sicca syndrome with keratoconjunctivitis: Secondary | ICD-10-CM | POA: Diagnosis not present

## 2024-02-23 DIAGNOSIS — H401131 Primary open-angle glaucoma, bilateral, mild stage: Secondary | ICD-10-CM | POA: Diagnosis not present

## 2024-02-23 DIAGNOSIS — H353131 Nonexudative age-related macular degeneration, bilateral, early dry stage: Secondary | ICD-10-CM | POA: Diagnosis not present

## 2024-03-21 ENCOUNTER — Ambulatory Visit (INDEPENDENT_AMBULATORY_CARE_PROVIDER_SITE_OTHER): Admitting: Dermatology

## 2024-03-21 ENCOUNTER — Encounter: Payer: Self-pay | Admitting: Dermatology

## 2024-03-21 DIAGNOSIS — L821 Other seborrheic keratosis: Secondary | ICD-10-CM | POA: Diagnosis not present

## 2024-03-21 DIAGNOSIS — L578 Other skin changes due to chronic exposure to nonionizing radiation: Secondary | ICD-10-CM

## 2024-03-21 DIAGNOSIS — L570 Actinic keratosis: Secondary | ICD-10-CM

## 2024-03-21 DIAGNOSIS — D692 Other nonthrombocytopenic purpura: Secondary | ICD-10-CM | POA: Diagnosis not present

## 2024-03-21 DIAGNOSIS — D1801 Hemangioma of skin and subcutaneous tissue: Secondary | ICD-10-CM | POA: Diagnosis not present

## 2024-03-21 DIAGNOSIS — L820 Inflamed seborrheic keratosis: Secondary | ICD-10-CM

## 2024-03-21 DIAGNOSIS — W908XXA Exposure to other nonionizing radiation, initial encounter: Secondary | ICD-10-CM | POA: Diagnosis not present

## 2024-03-21 NOTE — Patient Instructions (Addendum)

## 2024-03-21 NOTE — Progress Notes (Unsigned)
 Follow-Up Visit   Subjective  H.R. R Good is a 83 y.o. male who presents for the following: AK 8m f/u face, ears, ISK 86m f/u face The patient has spots, moles and lesions to be evaluated, some may be new or changing and the patient may have concern these could be cancer.   The following portions of the chart were reviewed this encounter and updated as appropriate: medications, allergies, medical history  Review of Systems:  No other skin or systemic complaints except as noted in HPI or Assessment and Plan.  Objective  Well appearing patient in no apparent distress; mood and affect are within normal limits.   A focused examination was performed of the following areas: Face, ears, arms  Relevant exam findings are noted in the Assessment and Plan.  face, ears x 4 (4) Pink scaly macules face x 1 Stuck on waxy paps with erythema  Assessment & Plan   Purpura - Chronic; persistent and recurrent.  Treatable, but not curable. Arms  - Violaceous macules and patches - Benign - Related to trauma, age, sun damage and/or use of blood thinners, chronic use of topical and/or oral steroids - Observe - Can use OTC arnica containing moisturizer such as Dermend Bruise Formula if desired - Call for worsening or other concerns  SEBORRHEIC KERATOSIS - Stuck-on, waxy, tan-brown papules and/or plaques  - Benign-appearing - Discussed benign etiology and prognosis. - Observe - Call for any changes  HEMANGIOMA Exam: red papule(s) Discussed benign nature. Recommend observation. Call for changes.  ACTINIC DAMAGE - chronic, secondary to cumulative UV radiation exposure/sun exposure over time - diffuse scaly erythematous macules with underlying dyspigmentation - Recommend daily broad spectrum sunscreen SPF 30+ to sun-exposed areas, reapply every 2 hours as needed.  - Recommend staying in the shade or wearing long sleeves, sun glasses (UVA+UVB protection) and wide brim hats (4-inch brim  around the entire circumference of the hat). - Call for new or changing lesions.    AK (ACTINIC KERATOSIS) (4) face, ears x 4 (4) Actinic keratoses are precancerous spots that appear secondary to cumulative UV radiation exposure/sun exposure over time. They are chronic with expected duration over 1 year. A portion of actinic keratoses will progress to squamous cell carcinoma of the skin. It is not possible to reliably predict which spots will progress to skin cancer and so treatment is recommended to prevent development of skin cancer.  Recommend daily broad spectrum sunscreen SPF 30+ to sun-exposed areas, reapply every 2 hours as needed.  Recommend staying in the shade or wearing long sleeves, sun glasses (UVA+UVB protection) and wide brim hats (4-inch brim around the entire circumference of the hat). Call for new or changing lesions. Destruction of lesion - face, ears x 4 (4) Complexity: simple   Destruction method: cryotherapy   Informed consent: discussed and consent obtained   Timeout:  patient name, date of birth, surgical site, and procedure verified Lesion destroyed using liquid nitrogen: Yes   Region frozen until ice ball extended beyond lesion: Yes   Outcome: patient tolerated procedure well with no complications   Post-procedure details: wound care instructions given    INFLAMED SEBORRHEIC KERATOSIS face x 1 Symptomatic, irritating, patient would like treated. Destruction of lesion - face x 1 Complexity: simple   Destruction method: cryotherapy   Informed consent: discussed and consent obtained   Timeout:  patient name, date of birth, surgical site, and procedure verified Lesion destroyed using liquid nitrogen: Yes   Region frozen until ice ball  extended beyond lesion: Yes   Outcome: patient tolerated procedure well with no complications   Post-procedure details: wound care instructions given    ACTINIC SKIN DAMAGE   SEBORRHEIC KERATOSIS   PURPURA   HEMANGIOMA OF  SKIN    Return in about 1 year (around 03/21/2025) for Hx of AKs, UBSE.  I, Sonya Hupman, RMA, am acting as scribe for Alm Rhyme, MD .   Documentation: I have reviewed the above documentation for accuracy and completeness, and I agree with the above.  Alm Rhyme, MD

## 2024-03-22 ENCOUNTER — Encounter: Payer: Self-pay | Admitting: Dermatology

## 2024-04-07 DIAGNOSIS — B0052 Herpesviral keratitis: Secondary | ICD-10-CM | POA: Diagnosis not present

## 2024-04-07 DIAGNOSIS — H401131 Primary open-angle glaucoma, bilateral, mild stage: Secondary | ICD-10-CM | POA: Diagnosis not present

## 2024-04-07 DIAGNOSIS — H353131 Nonexudative age-related macular degeneration, bilateral, early dry stage: Secondary | ICD-10-CM | POA: Diagnosis not present

## 2024-06-14 ENCOUNTER — Encounter: Payer: Medicare Other | Admitting: Family Medicine

## 2025-03-21 ENCOUNTER — Ambulatory Visit: Admitting: Dermatology
